# Patient Record
Sex: Female | Born: 1965 | Race: Black or African American | Hispanic: No | Smoking: Never smoker
Health system: Southern US, Community
[De-identification: ages and names within clinical notes are randomized; demographics above are authoritative.]

## PROBLEM LIST (undated history)

## (undated) DIAGNOSIS — R519 Headache, unspecified: Secondary | ICD-10-CM

## (undated) DIAGNOSIS — E559 Vitamin D deficiency, unspecified: Secondary | ICD-10-CM

## (undated) DIAGNOSIS — E785 Hyperlipidemia, unspecified: Secondary | ICD-10-CM

## (undated) DIAGNOSIS — F419 Anxiety disorder, unspecified: Secondary | ICD-10-CM

## (undated) DIAGNOSIS — K5792 Diverticulitis of intestine, part unspecified, without perforation or abscess without bleeding: Secondary | ICD-10-CM

## (undated) DIAGNOSIS — G35 Multiple sclerosis: Secondary | ICD-10-CM

## (undated) DIAGNOSIS — I10 Essential (primary) hypertension: Secondary | ICD-10-CM

## (undated) DIAGNOSIS — R011 Cardiac murmur, unspecified: Secondary | ICD-10-CM

## (undated) DIAGNOSIS — R51 Headache: Secondary | ICD-10-CM

## (undated) DIAGNOSIS — F32A Depression, unspecified: Secondary | ICD-10-CM

## (undated) DIAGNOSIS — G4730 Sleep apnea, unspecified: Secondary | ICD-10-CM

## (undated) DIAGNOSIS — K219 Gastro-esophageal reflux disease without esophagitis: Secondary | ICD-10-CM

## (undated) DIAGNOSIS — Z8719 Personal history of other diseases of the digestive system: Secondary | ICD-10-CM

## (undated) DIAGNOSIS — F329 Major depressive disorder, single episode, unspecified: Secondary | ICD-10-CM

## (undated) HISTORY — DX: Headache, unspecified: R51.9

## (undated) HISTORY — DX: Depression, unspecified: F32.A

## (undated) HISTORY — PX: ABDOMINAL HYSTERECTOMY: SHX81

## (undated) HISTORY — DX: Headache: R51

## (undated) HISTORY — DX: Major depressive disorder, single episode, unspecified: F32.9

## (undated) HISTORY — DX: Vitamin D deficiency, unspecified: E55.9

---

## 1990-05-26 HISTORY — PX: TUBAL LIGATION: SHX77

## 2001-01-20 ENCOUNTER — Encounter: Admission: RE | Admit: 2001-01-20 | Discharge: 2001-01-20 | Payer: Self-pay | Admitting: Neurosurgery

## 2001-01-20 ENCOUNTER — Encounter: Payer: Self-pay | Admitting: Neurosurgery

## 2001-03-02 ENCOUNTER — Ambulatory Visit (HOSPITAL_COMMUNITY): Admission: RE | Admit: 2001-03-02 | Discharge: 2001-03-02 | Payer: Self-pay | Admitting: Neurosurgery

## 2001-03-29 ENCOUNTER — Inpatient Hospital Stay (HOSPITAL_COMMUNITY): Admission: RE | Admit: 2001-03-29 | Discharge: 2001-04-03 | Payer: Self-pay | Admitting: Neurosurgery

## 2001-03-29 ENCOUNTER — Encounter: Payer: Self-pay | Admitting: Neurosurgery

## 2002-05-26 HISTORY — PX: BACK SURGERY: SHX140

## 2002-10-12 ENCOUNTER — Encounter: Payer: Self-pay | Admitting: *Deleted

## 2002-10-12 ENCOUNTER — Ambulatory Visit (HOSPITAL_COMMUNITY): Admission: RE | Admit: 2002-10-12 | Discharge: 2002-10-12 | Payer: Self-pay | Admitting: *Deleted

## 2004-09-02 ENCOUNTER — Emergency Department (HOSPITAL_COMMUNITY): Admission: EM | Admit: 2004-09-02 | Discharge: 2004-09-02 | Payer: Self-pay | Admitting: Emergency Medicine

## 2005-09-03 ENCOUNTER — Emergency Department (HOSPITAL_COMMUNITY): Admission: EM | Admit: 2005-09-03 | Discharge: 2005-09-03 | Payer: Self-pay | Admitting: Emergency Medicine

## 2005-11-05 ENCOUNTER — Encounter (INDEPENDENT_AMBULATORY_CARE_PROVIDER_SITE_OTHER): Payer: Self-pay | Admitting: Specialist

## 2005-11-05 ENCOUNTER — Ambulatory Visit (HOSPITAL_COMMUNITY): Admission: RE | Admit: 2005-11-05 | Discharge: 2005-11-05 | Payer: Self-pay | Admitting: Obstetrics & Gynecology

## 2005-11-17 ENCOUNTER — Emergency Department (HOSPITAL_COMMUNITY): Admission: EM | Admit: 2005-11-17 | Discharge: 2005-11-17 | Payer: Self-pay | Admitting: Emergency Medicine

## 2006-01-25 ENCOUNTER — Emergency Department (HOSPITAL_COMMUNITY): Admission: EM | Admit: 2006-01-25 | Discharge: 2006-01-25 | Payer: Self-pay | Admitting: Emergency Medicine

## 2006-05-26 HISTORY — PX: PARTIAL HYSTERECTOMY: SHX80

## 2006-07-23 ENCOUNTER — Ambulatory Visit: Payer: Self-pay | Admitting: Internal Medicine

## 2006-09-04 ENCOUNTER — Emergency Department (HOSPITAL_COMMUNITY): Admission: EM | Admit: 2006-09-04 | Discharge: 2006-09-05 | Payer: Self-pay | Admitting: Emergency Medicine

## 2007-04-27 ENCOUNTER — Ambulatory Visit (HOSPITAL_COMMUNITY): Admission: RE | Admit: 2007-04-27 | Discharge: 2007-04-27 | Payer: Self-pay | Admitting: Nurse Practitioner

## 2007-04-27 ENCOUNTER — Encounter: Payer: Self-pay | Admitting: Nurse Practitioner

## 2007-05-27 ENCOUNTER — Encounter: Payer: Self-pay | Admitting: Nurse Practitioner

## 2007-05-27 HISTORY — PX: OTHER SURGICAL HISTORY: SHX169

## 2007-06-23 ENCOUNTER — Encounter: Admission: RE | Admit: 2007-06-23 | Discharge: 2007-06-23 | Payer: Self-pay | Admitting: Neurosurgery

## 2007-07-16 ENCOUNTER — Ambulatory Visit (HOSPITAL_COMMUNITY): Admission: RE | Admit: 2007-07-16 | Discharge: 2007-07-16 | Payer: Self-pay | Admitting: Neurosurgery

## 2007-08-31 ENCOUNTER — Encounter: Admission: RE | Admit: 2007-08-31 | Discharge: 2007-08-31 | Payer: Self-pay | Admitting: Neurosurgery

## 2007-12-27 ENCOUNTER — Ambulatory Visit (HOSPITAL_COMMUNITY): Admission: RE | Admit: 2007-12-27 | Discharge: 2007-12-27 | Payer: Self-pay | Admitting: Neurology

## 2008-05-30 ENCOUNTER — Emergency Department (HOSPITAL_COMMUNITY): Admission: EM | Admit: 2008-05-30 | Discharge: 2008-05-30 | Payer: Self-pay | Admitting: Emergency Medicine

## 2008-06-19 ENCOUNTER — Ambulatory Visit: Admission: RE | Admit: 2008-06-19 | Discharge: 2008-06-19 | Payer: Self-pay | Admitting: Occupational Therapy

## 2008-08-06 ENCOUNTER — Emergency Department (HOSPITAL_COMMUNITY): Admission: EM | Admit: 2008-08-06 | Discharge: 2008-08-06 | Payer: Self-pay | Admitting: Emergency Medicine

## 2008-08-07 ENCOUNTER — Ambulatory Visit (HOSPITAL_BASED_OUTPATIENT_CLINIC_OR_DEPARTMENT_OTHER): Admission: RE | Admit: 2008-08-07 | Discharge: 2008-08-07 | Payer: Self-pay | Admitting: Neurology

## 2008-08-22 ENCOUNTER — Ambulatory Visit (HOSPITAL_COMMUNITY): Admission: RE | Admit: 2008-08-22 | Discharge: 2008-08-22 | Payer: Self-pay | Admitting: Nurse Practitioner

## 2008-08-25 ENCOUNTER — Emergency Department (HOSPITAL_COMMUNITY): Admission: EM | Admit: 2008-08-25 | Discharge: 2008-08-26 | Payer: Self-pay | Admitting: Emergency Medicine

## 2009-05-01 ENCOUNTER — Emergency Department: Payer: Self-pay | Admitting: Emergency Medicine

## 2009-07-29 ENCOUNTER — Emergency Department (HOSPITAL_COMMUNITY): Admission: EM | Admit: 2009-07-29 | Discharge: 2009-07-29 | Payer: Self-pay | Admitting: Emergency Medicine

## 2010-01-02 ENCOUNTER — Emergency Department (HOSPITAL_COMMUNITY)
Admission: EM | Admit: 2010-01-02 | Discharge: 2010-01-02 | Payer: Self-pay | Source: Home / Self Care | Admitting: Emergency Medicine

## 2010-01-24 DIAGNOSIS — K5792 Diverticulitis of intestine, part unspecified, without perforation or abscess without bleeding: Secondary | ICD-10-CM

## 2010-01-24 HISTORY — DX: Diverticulitis of intestine, part unspecified, without perforation or abscess without bleeding: K57.92

## 2010-01-29 ENCOUNTER — Inpatient Hospital Stay (HOSPITAL_COMMUNITY): Admission: EM | Admit: 2010-01-29 | Discharge: 2010-02-01 | Payer: Self-pay | Admitting: Emergency Medicine

## 2010-03-27 ENCOUNTER — Ambulatory Visit: Payer: Self-pay

## 2010-04-11 ENCOUNTER — Ambulatory Visit: Payer: Self-pay

## 2010-06-15 ENCOUNTER — Emergency Department (HOSPITAL_COMMUNITY)
Admission: EM | Admit: 2010-06-15 | Discharge: 2010-06-15 | Payer: Self-pay | Source: Home / Self Care | Admitting: Emergency Medicine

## 2010-06-18 LAB — URINALYSIS, ROUTINE W REFLEX MICROSCOPIC
Bilirubin Urine: NEGATIVE
Ketones, ur: NEGATIVE mg/dL
Specific Gravity, Urine: 1.015 (ref 1.005–1.030)
Urobilinogen, UA: 0.2 mg/dL (ref 0.0–1.0)

## 2010-06-18 LAB — CBC
HCT: 40.4 % (ref 36.0–46.0)
MCH: 27.4 pg (ref 26.0–34.0)
MCHC: 34.9 g/dL (ref 30.0–36.0)
RBC: 5.14 MIL/uL — ABNORMAL HIGH (ref 3.87–5.11)

## 2010-06-18 LAB — BASIC METABOLIC PANEL
CO2: 26 mEq/L (ref 19–32)
Calcium: 9.2 mg/dL (ref 8.4–10.5)
Chloride: 103 mEq/L (ref 96–112)
Creatinine, Ser: 0.88 mg/dL (ref 0.4–1.2)
GFR calc Af Amer: >60 mL/min (ref >60)
GFR calc non Af Amer: >60 mL/min (ref >60)
Potassium: 3.6 mEq/L (ref 3.5–5.1)
Sodium: 138 mEq/L (ref 135–145)

## 2010-06-18 LAB — DIFFERENTIAL
Eosinophils Relative: 3 % (ref 0–5)
Lymphs Abs: 1.5 10*3/uL (ref 0.7–4.0)
Monocytes Absolute: 0.5 10*3/uL (ref 0.1–1.0)
Monocytes Relative: 14 % — ABNORMAL HIGH (ref 3–12)
Neutro Abs: 1.3 10*3/uL — ABNORMAL LOW (ref 1.7–7.7)

## 2010-06-26 ENCOUNTER — Emergency Department (HOSPITAL_COMMUNITY): Admit: 2010-06-26 | Discharge: 2010-06-26 | Disposition: A | Discharge status: Home / Self Care | Payer: Medicare Other

## 2010-06-26 ENCOUNTER — Emergency Department (HOSPITAL_COMMUNITY)
Admission: EM | Admit: 2010-06-26 | Discharge: 2010-06-26 | Disposition: A | Discharge status: Home / Self Care | Payer: Medicare Other | Attending: Emergency Medicine | Admitting: Emergency Medicine

## 2010-06-26 ENCOUNTER — Encounter (HOSPITAL_COMMUNITY): Payer: Self-pay

## 2010-06-26 DIAGNOSIS — R51 Headache: Secondary | ICD-10-CM | POA: Insufficient documentation

## 2010-06-26 DIAGNOSIS — R5383 Other fatigue: Secondary | ICD-10-CM | POA: Insufficient documentation

## 2010-06-26 DIAGNOSIS — J3489 Other specified disorders of nose and nasal sinuses: Secondary | ICD-10-CM | POA: Insufficient documentation

## 2010-06-26 DIAGNOSIS — R059 Cough, unspecified: Secondary | ICD-10-CM | POA: Insufficient documentation

## 2010-06-26 DIAGNOSIS — R05 Cough: Secondary | ICD-10-CM | POA: Insufficient documentation

## 2010-06-26 DIAGNOSIS — R5381 Other malaise: Secondary | ICD-10-CM | POA: Insufficient documentation

## 2010-06-26 DIAGNOSIS — R509 Fever, unspecified: Secondary | ICD-10-CM | POA: Insufficient documentation

## 2010-06-26 DIAGNOSIS — R42 Dizziness and giddiness: Secondary | ICD-10-CM | POA: Insufficient documentation

## 2010-08-08 LAB — CBC
HCT: 36.4 % (ref 36.0–46.0)
HCT: 38.2 % (ref 36.0–46.0)
Hemoglobin: 12.2 g/dL (ref 12.0–15.0)
Hemoglobin: 12.7 g/dL (ref 12.0–15.0)
MCHC: 33.3 g/dL (ref 30.0–36.0)
MCV: 81.6 fL (ref 78.0–100.0)
MCV: 82.6 fL (ref 78.0–100.0)
Platelets: 221 10*3/uL (ref 150–400)
Platelets: 239 10*3/uL (ref 150–400)
RBC: 4.48 MIL/uL (ref 3.87–5.11)
RDW: 13.9 % (ref 11.5–15.5)
RDW: 14.2 % (ref 11.5–15.5)
WBC: 3.1 10*3/uL — ABNORMAL LOW (ref 4.0–10.5)
WBC: 3.6 10*3/uL — ABNORMAL LOW (ref 4.0–10.5)
WBC: 4.4 10*3/uL (ref 4.0–10.5)

## 2010-08-08 LAB — HEPATIC FUNCTION PANEL
Albumin: 3.5 g/dL (ref 3.5–5.2)
Alkaline Phosphatase: 63 U/L (ref 39–117)
Total Protein: 6.5 g/dL (ref 6.0–8.3)

## 2010-08-08 LAB — DIFFERENTIAL
Basophils Absolute: 0 10*3/uL (ref 0.0–0.1)
Basophils Absolute: 0 10*3/uL (ref 0.0–0.1)
Basophils Relative: 1 % (ref 0–1)
Eosinophils Absolute: 0.1 10*3/uL (ref 0.0–0.7)
Eosinophils Relative: 3 % (ref 0–5)
Lymphocytes Relative: 30 % (ref 12–46)
Neutro Abs: 1.9 10*3/uL (ref 1.7–7.7)
Neutro Abs: 2.2 10*3/uL (ref 1.7–7.7)
Neutrophils Relative %: 51 % (ref 43–77)
Neutrophils Relative %: 52 % (ref 43–77)

## 2010-08-08 LAB — URINALYSIS, ROUTINE W REFLEX MICROSCOPIC
Bilirubin Urine: NEGATIVE
Ketones, ur: NEGATIVE mg/dL
Nitrite: NEGATIVE
Protein, ur: NEGATIVE mg/dL
Specific Gravity, Urine: 1.01 (ref 1.005–1.030)
Urobilinogen, UA: 1 mg/dL (ref 0.0–1.0)

## 2010-08-08 LAB — BASIC METABOLIC PANEL
BUN: 10 mg/dL (ref 6–23)
CO2: 25 mEq/L (ref 19–32)
Calcium: 8.7 mg/dL (ref 8.4–10.5)
Chloride: 111 mEq/L (ref 96–112)
GFR calc Af Amer: >60 mL/min (ref >60)
GFR calc non Af Amer: >60 mL/min (ref >60)
GFR calc non Af Amer: >60 mL/min (ref >60)
Glucose, Bld: 100 mg/dL — ABNORMAL HIGH (ref 70–99)
Glucose, Bld: 93 mg/dL (ref 70–99)
Potassium: 3.5 mEq/L (ref 3.5–5.1)
Potassium: 4 mEq/L (ref 3.5–5.1)
Sodium: 139 mEq/L (ref 135–145)
Sodium: 140 mEq/L (ref 135–145)

## 2010-08-08 LAB — MAGNESIUM: Magnesium: 2.2 mg/dL (ref 1.5–2.5)

## 2010-08-08 LAB — TSH: TSH: 2.64 u[IU]/mL (ref 0.350–4.500)

## 2010-08-08 LAB — PREGNANCY, URINE: Preg Test, Ur: NEGATIVE

## 2010-08-09 LAB — DIFFERENTIAL
Eosinophils Relative: 1 % (ref 0–5)
Lymphocytes Relative: 22 % (ref 12–46)
Lymphs Abs: 1.1 10*3/uL (ref 0.7–4.0)
Monocytes Absolute: 0.6 10*3/uL (ref 0.1–1.0)

## 2010-08-09 LAB — CBC
HCT: 40.6 % (ref 36.0–46.0)
MCV: 82.6 fL (ref 78.0–100.0)
RBC: 4.91 MIL/uL (ref 3.87–5.11)
WBC: 4.8 10*3/uL (ref 4.0–10.5)

## 2010-08-09 LAB — ETHANOL: Alcohol, Ethyl (B): <5 mg/dL (ref 0–10)

## 2010-08-09 LAB — RAPID URINE DRUG SCREEN, HOSP PERFORMED
Barbiturates: NOT DETECTED
Cocaine: NOT DETECTED

## 2010-08-09 LAB — BASIC METABOLIC PANEL
BUN: 6 mg/dL (ref 6–23)
Chloride: 106 mEq/L (ref 96–112)
Potassium: 3.6 mEq/L (ref 3.5–5.1)

## 2010-08-18 LAB — URINALYSIS, ROUTINE W REFLEX MICROSCOPIC
Bilirubin Urine: NEGATIVE
Glucose, UA: NEGATIVE mg/dL
Hgb urine dipstick: NEGATIVE
Ketones, ur: NEGATIVE mg/dL
Nitrite: NEGATIVE
Protein, ur: NEGATIVE mg/dL
Specific Gravity, Urine: 1.02 (ref 1.005–1.030)
Urobilinogen, UA: 1 mg/dL (ref 0.0–1.0)
pH: 6 (ref 5.0–8.0)

## 2010-09-09 LAB — CBC
HCT: 41.7 % (ref 36.0–46.0)
MCV: 80.9 fL (ref 78.0–100.0)
Platelets: 196 10*3/uL (ref 150–400)
RDW: 13.7 % (ref 11.5–15.5)
WBC: 3.7 10*3/uL — ABNORMAL LOW (ref 4.0–10.5)

## 2010-09-09 LAB — BASIC METABOLIC PANEL
BUN: 7 mg/dL (ref 6–23)
Chloride: 104 mEq/L (ref 96–112)
Glucose, Bld: 81 mg/dL (ref 70–99)
Potassium: 3.7 mEq/L (ref 3.5–5.1)

## 2010-09-09 LAB — DIFFERENTIAL
Eosinophils Absolute: 0.1 10*3/uL (ref 0.0–0.7)
Eosinophils Relative: 2 % (ref 0–5)
Lymphs Abs: 1.4 10*3/uL (ref 0.7–4.0)
Monocytes Absolute: 0.6 10*3/uL (ref 0.1–1.0)

## 2010-09-09 LAB — PROTIME-INR: Prothrombin Time: 12.8 seconds (ref 11.6–15.2)

## 2010-09-09 LAB — POCT CARDIAC MARKERS
Myoglobin, poc: 45.4 ng/mL (ref 12–200)
Troponin i, poc: <0.05 ng/mL (ref 0.00–0.09)

## 2010-10-08 NOTE — Procedures (Signed)
[name], Jill English            ACCOUNT NO.:  [id number]   MEDICAL RECORD NO.:  [id number]          PATIENT TYPE:  OUT   LOCATION:  SLEE                         FACILITY:  [facility]   PHYSICIAN:  Kofi A. Gerilyn Pilgrim, M.D. DATE OF BIRTH:  [birth year]   DATE OF PROCEDURE:  08/07/2008  DATE OF DISCHARGE:  08/07/2008                             SLEEP DISORDER REPORT   TYPE OF STUDY:  Multiple sleep latency tests.   The patient is a 45 year old lady who presents with marked hypersomnia.  She previously did have a nocturnal polysomnography, which showed  pathologically early REM latency and a high REM percentage.   EPWORTH SLEEPINESS SCALE:  21.   ARCHITECTURAL SUMMARY:  A nocturnal polysomnography was not done the  night before the PSG was done 3 weeks before.  Sleep logs were given  before the study was carried out.   MEDICATIONS:  Rebif, Ativan, Zocor, and Lotensin.   The patient had a series of daytime naps, total of 5.  There were some  adjustment made on the sleep tech's sleep latency by the physician.  Nap  1 had a sleep latency of 2.5 with no sleep onsets of REM.  Nap 2, sleep  onset of 5.5 minutes with sleep onsets of REM observed.  Nap 3, sleep  latency of 1 minute with sleep onsets of REM observed.  Nap 4, sleep  latency of 1 minute with sleep onsets of REM observed.  Nap 5, sleep  latency at 2.5 with sleep onsets of REM.  The mean sleep latency is 2.5  minutes.   IMPRESSION:  This study shows a mean sleep latency of 2.5 minutes with  4/5 naps associated with sleep onset REM, meeting the polysomnographic  criteria for narcolepsy.      Kofi A. Gerilyn Pilgrim, M.D.  Electronically Signed     KAD/MEDQ  D:  09/05/2008  T:  09/06/2008  Job:  [id number]

## 2010-10-08 NOTE — Op Note (Signed)
Jill English, Jill English            ACCOUNT NO.:  [id number]   MEDICAL RECORD NO.:  [id number]          PATIENT TYPE:  AMB   LOCATION:  SDS                          FACILITY:  [facility]   PHYSICIAN:  Henry A. Pool, M.D.    DATE OF BIRTH:  [birth year]   DATE OF PROCEDURE:  07/16/2007  DATE OF DISCHARGE:  07/16/2007                               OPERATIVE REPORT   SERVICE:  Neurosurgery.   PREOPERATIVE DIAGNOSIS:  Right carpal tunnel syndrome.   POSTOPERATIVE DIAGNOSIS:  Right carpal tunnel syndrome.   PROCEDURE NOTE:  Right carpal tunnel release.   SURGEON:  Kathaleen Maser. Pool, M.D.   ASSISTANT:  None.   ANESTHESIA:  Is general.   INDICATIONS:  Ms. Lafever is a 46 year old female with history of pain  and paresthesias and weakness with this with right-sided carpal tunnel  syndrome failing conservative measures.  Workup demonstrates evidence of  marked median nerve entrapment at the wrist evidence by nerve conduction  slowing and EMG changes.  The patient has been counseled as to her  options.  She decided to proceed with a right-sided carpal tunnel  release.   PROCEDURE IN DETAIL:  The patient was taken to the operating room and  placed on the operating table in supine position with her right arm  outstretched.  After an adequate level of anesthesia achieved the  patient's right upper extremity was prepped and draped sterilely.  A 15  blade was used make a linear skin incision along the mid palmar crease  just distal to the distal wrist fold.  This was carried down sharply  through the palmar fascia.  Transverse carpal ligament was identified.  Transverse carpal ligament was then divided exposing the underlying  median nerve.  The median nerve was then protected with a Therapist, nutritional  and the transverse carpal ligament was divided distally into the palm  completely freeing the distal segment of the median nerve.  The Freer  elevator was then redirected proximally.  The proximal aspect  of the  transverse carpal ligament was then divided up into the distal forearm,  completely decompressing this aspect of the median nerve.  At this point  the wound was irrigated with antibiotic solution.  The nerve was  inspected and found to be free of injury.  The wound was then closed in  a typical fashion.  Dry sterile dressing was applied.  There were no  complications.  The patient tolerated the procedure well and she returns  to recovery for postoperative care.           ______________________________  Kathaleen Maser. Pool, M.D.     HAP/MEDQ  D:  07/16/2007  T:  07/16/2007  Job:  [id number]

## 2010-10-11 NOTE — Op Note (Signed)
NAMESHI, GROSE            ACCOUNT NO.:  [id number]   MEDICAL RECORD NO.:  [id number]          PATIENT TYPE:  AMB   LOCATION:  DAY                           FACILITY:  [facility]   PHYSICIAN:  Lazaro Arms, M.D.   DATE OF BIRTH:  Feb 29, 1966   DATE OF PROCEDURE:  11/05/2005  DATE OF DISCHARGE:                                 OPERATIVE REPORT   PREOPERATIVE DIAGNOSIS:  1.  Right lower quadrant pain.  2.  Dyspareunia on the right side.   POSTOPERATIVE DIAGNOSIS:  1.  Right lower quadrant pain.  2.  Dyspareunia on the right side.  3.  Pelvic adhesive disease.   PROCEDURE:  1.  Laparoscopic right salpingo-oophorectomy.  2.  Laparoscopic lysis of adhesions, extensive.   SURGEON:  Lazaro Arms, M.D.   ANESTHESIA:  General endotracheal.   FINDINGS:  The patient had large bowel and omentum adherent to the pelvis,  to the vaginal cuff, and to the pelvic side wall on the right.  I think this  pretty clearly along with the adhesions of the right ovary, was the source  of the patient's pain.  Right ovary otherwise appeared to be normal, it was  just adherent to the side wall.  The left ovary was normal.  It was also  adherent to the sigmoid epiploica, but that was taken down without  difficulty and it was freely mobile and completely normal.  It was not  adherent to the vaginal cuff.   DESCRIPTION OF PROCEDURE:  The patient was taken to the operating room,  placed in the supine position, and underwent general endotracheal  anesthesia.  She was placed in the dorsal lithotomy position and prepped and  draped in the usual sterile fashion.  Incision was made in the umbilicus.  Veress needle was placed, confirmed using the saline drop test.  The abdomen  was insufflated.  Under direct visualization, nonbladed trocar was used and  the video laparoscope was used to place trocar in the peritoneal cavity.  I  then placed two 5 mm trocars in the suprapubic area in the midline as well  as  the right lower quadrant without difficulty.  The above noted findings  were seen.  I used the Harmonic scalpel to do a right salpingo-oophorectomy  without difficulty.  There was good hemostasis and also used blunt and  Harmonic scalpel to take down the adhesions of the omental fat and epiploica  and other adhesions.  These were done without difficulty.  With clear  visualization, there was no injury to any organ structures.  The pelvis was  irrigated vigorously and actually left some fluid in the pelvis to try to  prevent some postoperative adhesions from  forming.  The instruments were removed.  The fascia was closed using 0  Vicryl.  All of the skin incisions were closed using staples.  The patient  tolerated the procedure well.  She experienced minimal blood loss and was  taken to the recovery room in good stable condition.  All needle, sponge,  and instrument counts correct.      Lazaro Arms, M.D.  Electronically Signed     LHE/MEDQ  D:  11/05/2005  T:  11/05/2005  Job:  [id number]

## 2010-10-11 NOTE — Procedures (Signed)
Jill English, Jill English            ACCOUNT NO.:  [id number]   MEDICAL RECORD NO.:  [id number]          PATIENT TYPE:  OUT   LOCATION:  SLEE                          FACILITY:  [facility]   PHYSICIAN:  Kofi A. Gerilyn Pilgrim, M.D. DATE OF BIRTH:  02/19/1966   DATE OF PROCEDURE:  DATE OF DISCHARGE:  06/19/2008                             SLEEP DISORDER REPORT   REFERRING PHYSICIAN:  Jay Schlichter.   INDICATIONS:  This is a 45 year old lady who presents with hypersomnia  and is being evaluated for obstructive sleep apnea syndrome.   MEDICATIONS:  Nexium, Zocor, Rebif, vitamin D.   Epworth sleepiness scale 14.  BMI 32.   ARCHITECTURE SUMMARY:  Total recording time of 406 minutes.  Sleep  efficiency 87%.  Sleep latency 60 minutes.  REM latency 46 minutes.  Stage N1 1%, N2 61%, N3 20%, and REM sleep 17%.   RESPIRATORY SUMMARY:  The baseline oxygen saturation is 99% with the  lowest saturation 92%.  The AHI is 1.   LIMB MOVEMENT SUMMARY:  The PLM index is 36.   ELECTROCARDIOGRAM SUMMARY:  Average heart rate 71 with no significant  dysrhythmias observed.   IMPRESSION:  1. Significantly early rapid eye movement sleep, suggestive of      narcolepsy, although other etiologies include withdrawal from      stimulant medications.  2. 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   RECOMMENDATIONS:  Given the early REM onset.  I suggest that a sleep  consultation be done.      Kofi A. Gerilyn Pilgrim, M.D.  Electronically Signed     KAD/MEDQ  D:  06/23/2008  T:  06/23/2008  Job:  [id number]

## 2010-10-11 NOTE — Discharge Summary (Signed)
[place]  Patient:    Jill English, Jill English Visit Number: [id number] MRN: [id number]          Service Type: SUR Location: 3000 3023 01 Attending Physician:  Donn Pierini Dictated by:   Julio Sicks, M.D. Admit Date:  03/29/2001 Discharge Date: 04/03/2001                             Discharge Summary  SERVICE:  Neurosurgery.  FINAL DIAGNOSES:  L4-5 and L5-S1 degenerative disk disease with chronic back and lower extremity pain.  OPERATIONS AND TREATMENTS: 1. L4-5 and L5-S1 decompressive lumbar laminectomy with foraminotomies. 2. L4-5 and L5-S1 posterior lumbar interbody fusion utilizing tangent    wedges and local autograft. 3. L4-5 and L5-S1 posterolateral fusion utilizing pedicle screws and local    autograft.  HISTORY OF PRESENT ILLNESS:  The patient is a 45 year old female with a history of chronic back and bilateral lower extremity pain failing all conservative management. Discography is positive both L4 and L5. The patient presents now for decompression and fusion surgery in hopes of alleviating some of her symptoms.  HOSPITAL COURSE:  The patient is taken to the operating room where an uncomplicated two level lumbar decompression and fusion surgery was performed. Postoperatively, the patient awakened with intact neurological function. She had a great deal of difficulty with incisional pain which gradually cleared with time. She had some difficulty with a postoperative ileus which also cleared over time.  At the time of discharge, the patient was ambulating with minimal assistance of a walker. Neurologically, she was intact; the wound was healing well. There was no evidence of infection. Bowel and bladder function was normal.  DISPOSITION:  The patient will be discharged home. She will follow-up in my office in one week.  DISCHARGE MEDICATIONS: 1. Oxycontin p.r.n. pain. 2. Percocet p.r.n. pain. 3. Valium p.r.n.  pain.  CONDITION ON DISCHARGE:  Improved. Dictated by:   Julio Sicks, M.D. Attending Physician:  Donn Pierini DD:  03/16/02 TD:  06/17/01 Job: [id number]

## 2010-10-11 NOTE — Op Note (Signed)
. [facility]  Patient:    Jill English, Jill English Visit Number: [id number] MRN: [id number]          Service Type: SUR Location: 3000 3023 01 Attending Physician:  Donn Pierini Dictated by:   Julio Sicks, M.D. Proc. Date: 03/29/01 Admit Date:  03/29/2001                             Operative Report  PREOPERATIVE DIAGNOSIS:  L4-5 and L5-S1 degenerative disk disease with chronic intractable back pain.  POSTOPERATIVE DIAGNOSIS:  L4-5 and L5-S1 degenerative disk disease with chronic intractable back pain.  PROCEDURES: 1. L4-5 and L5-S1 decompressive laminectomies with foraminotomies. 2. L4-5 and L5-S1 posterior lumbar interbody fusion utilizing Tangent wedges    and local autograft. 3. L4-5 and S1 posterolateral fusion utilizing segmental pedicle screw    fixation and local autograft.  SURGEON:  Julio Sicks, M.D.  ASSISTANT:  Reinaldo Meeker, M.D.  ANESTHESIA:  General endotracheal.  INDICATION:  Ms. [name] is a 45 year old black female with a history of severe back and right lower extremity pain failing all efforts at conservative management.  MRI scanning demonstrates degenerative disk disease at the L5-S1 level.  Lumbar discography demonstrates a large central annular disruption at L4-5 with extrusion of dye.  L5-S1 injection demonstrates severe degeneration of the disk with multiple annular fissures.  Injection at both these levels was provocative.  Injection at L3-4 was negative.  The patient has been counseled as to her options.  She has decided to proceed with L4-5 and L5-S1 decompression and fusion procedure in hopes of improving her situation.  DESCRIPTION OF PROCEDURE:  The patient was taken to the operating room and placed on the table in supine position.  After an adequate level of anesthesia achieved, patient positioned prone onto a Wilson frame, appropriately padded. The patients lumbar region was prepped and draped sterilely.   A 10 blade was used to make a linear skin incision overlying the L3, L4, L5, and S1 levels. The incision was carried down in the midline.  A subperiosteal dissection was then performed bilaterally, exposing the laminae and facet joints at L3, L4, L5, and the sacrum.  The transverse processes at L4 and L5 as well as the sacral ala were then also dissected out.  A deep self-retaining retractor was placed.  Intraoperative fluoroscopy was used, and the levels were confirmed. Decompressive laminectomy was then performed using the Leksell rongeur, the high-speed drill, and Kerrison rongeurs to completely remove the lamina at L5 and completely remove the lamina of L4.  The inferior facets at L4 and L5 were also completely removed.  All bone was cleaned and later used for autografting.  Superior facets at L5 and the superior facet at S1 were removed as well.  Ligamentum flavum was then elevated and removed in piecemeal fashion at both levels.  Underlying thecal sac and exiting L4, L5, and S1 nerve roots were identified.  Wide foraminotomies were then performed along the exiting nerve roots at all three levels.  Attention then placed to the interspace after epidural venous plexus was coagulated and cut.  Starting first on the patients left side at L4-5, disk space was isolated, nerve roots were protected, disk space incised with a 15 blade.  An aggressive diskectomy was then performed at L4-5, removing all loose or obviously degenerative disk material.  The procedure then repeated on the contralateral side, again without complication.  The disk space  was then distracted up to 11 mm. Starting first the patients left side with the nerve roots protected, the disk space was then reamed and then cut with a 10 mm chisel.  A 10 x 26 mm Tangent wedge was then impacted in place and recessed approximately 2 mm from the posterior cortical surface.  The distraction system was removed.  There was no evidence of  injury to the thecal sac or nerve roots.  The procedure was then removed on the contralateral side, again without complication.  Prior to installation of the second wedge, morcellized autograft was then packed into the interspace.  The second wedge was placed, again without difficulty. Intraoperative x-rays revealed good position of bone graft, proper operative levels, normal alignment of the spine.  The procedure was then repeated in a similar fashion at L5-S1.  At L5-S1, 8 x 26 mm Tangent wedges were placed bilaterally.  Once again final images revealed good position of the bone grafts with normal alignment of the spine.  Transverse processes of L4, L5, and the sacral ala were identified.  The pedicles were isolated and identified by surface landmarks and by using fluoroscopic guidance.  Superficial bone overlying the pedicle was then removed using the high-speed drill.  The pedicle was then probed using a pedicle awl at L4, L5, and S1 bilaterally. Each pedicle awl tract was intact with 5.25 mm screw tap.  All screw tap holes were found to be solidly within bone by using a blunt probe and confirmed by x-ray guidance.  STRS variable-angle pedicle screws 6.75 x 40 mm were then placed at L4 bilaterally.  STRS variable-angle pedicle screws 6.75 x 35 mm were placed at L5 bilaterally.  STRS variable-angle pedicle screws 6.75 x 30 mm were then placed at S1 bilaterally.  All screws were given a final tightening and found to be solidly within bone.  An 80 mm length of titanium rod was then contoured and placed over the screw heads at L4, L5, and S1 bilaterally.  Locking caps were then placed over the screw heads. The caps were then engaged in a sequential fashion to place the construct under compression.  At this point intraoperative fluoroscopy revealed good position of the bone grafts and hardware and proper operative level, with normal alignment of the spine in both the lateral and AP planes.  A  blunt probe was passed easily along the course of the exiting nerve roots.  There was no evidence of injury to the thecal sac or nerve roots.  All instrumentation  appeared well-placed.  The transverse processes and sacral ala were then decorticated using the high-speed drill.  Morcellized autograft was then packed posterolaterally for later fusion.  The spinal canal was inspected one final time and found to be free of compression.  Gelfoam was placed topically for hemostasis, which was found to be good.  A medium Hemovac drain was left in the epidural space.  The wound was then closed in typical fashion. Steri-Strips and sterile dressing were applied.  There were no apparent complications.  The patient tolerated the procedure well, and she returns to the recovery room postop. Dictated by:   Julio Sicks, M.D. Attending Physician:  Donn Pierini DD:  03/29/01 TD:  03/30/01 Job: [id number]

## 2011-02-14 LAB — CBC
MCV: 81.8
RBC: 5.23 — ABNORMAL HIGH
WBC: 4.9

## 2011-02-21 LAB — CSF CELL COUNT WITH DIFFERENTIAL: Tube #: 3

## 2011-02-21 LAB — OLIGOCLONAL BANDS, CSF + SERM
Albumin Index: 3.8 ratio (ref 0.0–9.0)
Albumin, CSF: 15 mg/dL (ref 0–35)
CSF Oligoclonal Bands: POSITIVE
IgG, Serum: 1230 mg/dL (ref 768–1632)

## 2011-02-21 LAB — CRYPTOCOCCAL ANTIGEN, CSF: Crypto Ag: NEGATIVE

## 2011-02-21 LAB — ANGIOTENSIN CONVERTING ENZYME, CSF: Angio Convert Enzyme: <2 Units (ref <10)

## 2011-03-04 ENCOUNTER — Emergency Department (HOSPITAL_COMMUNITY): Payer: Medicare Other

## 2011-03-04 ENCOUNTER — Emergency Department (HOSPITAL_COMMUNITY)
Admission: EM | Admit: 2011-03-04 | Discharge: 2011-03-04 | Disposition: A | Discharge status: Home / Self Care | Payer: Medicare Other | Attending: Emergency Medicine | Admitting: Emergency Medicine

## 2011-03-04 ENCOUNTER — Encounter (HOSPITAL_COMMUNITY): Payer: Self-pay | Admitting: *Deleted

## 2011-03-04 DIAGNOSIS — M549 Dorsalgia, unspecified: Secondary | ICD-10-CM | POA: Insufficient documentation

## 2011-03-04 DIAGNOSIS — R079 Chest pain, unspecified: Secondary | ICD-10-CM | POA: Insufficient documentation

## 2011-03-04 DIAGNOSIS — R109 Unspecified abdominal pain: Secondary | ICD-10-CM | POA: Insufficient documentation

## 2011-03-04 HISTORY — DX: Multiple sclerosis: G35

## 2011-03-04 HISTORY — DX: Hyperlipidemia, unspecified: E78.5

## 2011-03-04 HISTORY — DX: Essential (primary) hypertension: I10

## 2011-03-04 HISTORY — DX: Diverticulitis of intestine, part unspecified, without perforation or abscess without bleeding: K57.92

## 2011-03-04 LAB — HEPATIC FUNCTION PANEL
ALT: 18 U/L (ref 0–35)
Alkaline Phosphatase: 96 U/L (ref 39–117)
Bilirubin, Direct: 0.1 mg/dL (ref 0.0–0.3)
Indirect Bilirubin: 0.1 mg/dL — ABNORMAL LOW (ref 0.3–0.9)
Total Bilirubin: 0.2 mg/dL — ABNORMAL LOW (ref 0.3–1.2)

## 2011-03-04 LAB — CBC
HCT: 41.4 % (ref 36.0–46.0)
Hemoglobin: 13.8 g/dL (ref 12.0–15.0)
MCHC: 33.3 g/dL (ref 30.0–36.0)

## 2011-03-04 LAB — URINALYSIS, ROUTINE W REFLEX MICROSCOPIC
Bilirubin Urine: NEGATIVE
Hgb urine dipstick: NEGATIVE
Protein, ur: NEGATIVE mg/dL
Urobilinogen, UA: 0.2 mg/dL (ref 0.0–1.0)

## 2011-03-04 LAB — DIFFERENTIAL
Basophils Relative: 0 % (ref 0–1)
Lymphocytes Relative: 39 % (ref 12–46)
Monocytes Absolute: 0.5 10*3/uL (ref 0.1–1.0)
Monocytes Relative: 12 % (ref 3–12)
Neutro Abs: 1.9 10*3/uL (ref 1.7–7.7)

## 2011-03-04 LAB — BASIC METABOLIC PANEL
BUN: 8 mg/dL (ref 6–23)
CO2: 23 mEq/L (ref 19–32)
Chloride: 103 mEq/L (ref 96–112)
Creatinine, Ser: 0.61 mg/dL (ref 0.50–1.10)
Glucose, Bld: 102 mg/dL — ABNORMAL HIGH (ref 70–99)

## 2011-03-04 MED ORDER — HYDROCODONE-ACETAMINOPHEN 5-500 MG PO TABS: 1.0000 | ORAL_TABLET | Freq: Four times a day (QID) | ORAL | Status: DC | PRN

## 2011-03-04 NOTE — ED Notes (Signed)
Pt c/o cp, abd pain, and back pain. Pt states cp has been off and on for about 3 weeks. Pt states back and abd pain have been constant for 2 weeks. Pt describes cp as sharp, abd pain as aching and sharp, and back pain as aching and sharp.

## 2011-03-04 NOTE — ED Provider Notes (Signed)
History     CSN: [id number] Arrival date & time: 03/04/2011 12:42 PM  Chief Complaint  Patient presents with  . Chest Pain  . Abdominal Pain  . Back Pain    (Consider location/radiation/quality/duration/timing/severity/associated sxs/prior treatment) Patient is a 45 y.o. female presenting with chest pain, abdominal pain, and back pain. The history is provided by the patient.  Chest Pain The chest pain began 3 - 5 days ago. Chest pain occurs constantly. The chest pain is worsening. The severity of the pain is moderate. The quality of the pain is described as aching. Chest pain is worsened by certain positions. Primary symptoms include abdominal pain. Pertinent negatives for primary symptoms include no fever, no fatigue, no syncope, no shortness of breath, no nausea and no vomiting. She tried nothing for the symptoms. Risk factors include no known risk factors. Past medical history comments: Back surgery    Abdominal Pain The primary symptoms of the illness include abdominal pain. The primary symptoms of the illness do not include fever, fatigue, shortness of breath, nausea or vomiting.  Additional symptoms associated with the illness include back pain. Symptoms associated with the illness do not include chills.  Back Pain  Associated symptoms include chest pain and abdominal pain. Pertinent negatives include no fever.    Past Medical History  Diagnosis Date  . MS (multiple sclerosis)   . Diverticulitis   . Hypertension   . Hyperlipidemia     Past Surgical History  Procedure Date  . Back surgery   . Abdominal hysterectomy   . Tubal ligation     No family history on file.  History  Substance Use Topics  . Smoking status: Never Smoker   . Smokeless tobacco: Not on file  . Alcohol Use: No    OB History    Grav Para Term Preterm Abortions TAB SAB Ect Mult Living                  Review of Systems  Constitutional: Negative for fever, chills and fatigue.  HENT:  Negative for neck pain and neck stiffness.   Respiratory: Negative for shortness of breath.   Cardiovascular: Positive for chest pain. Negative for syncope.  Gastrointestinal: Positive for abdominal pain. Negative for nausea and vomiting.  Musculoskeletal: Positive for back pain.  All other systems reviewed and are negative.    Allergies  Review of patient's allergies indicates no known allergies.  Home Medications  No current outpatient prescriptions on file.  BP 145/80  Pulse 88  Temp(Src) 98.8 F (37.1 C) (Oral)  Resp 18  Ht 5\' 3"  (1.6 m)  Wt 191 lb (86.637 kg)  BMI 33.83 kg/m2  SpO2 100%  Physical Exam  Nursing note and vitals reviewed. Constitutional: She is oriented to person, place, and time. She appears well-developed and well-nourished.  HENT:  Head: Normocephalic and atraumatic.  Neck: Normal range of motion. Neck supple.  Cardiovascular: Normal rate and regular rhythm.  Exam reveals no gallop and no friction rub.   No murmur heard. Pulmonary/Chest: Effort normal and breath sounds normal. No respiratory distress.  Abdominal: Soft. She exhibits no distension. There is no tenderness.  Musculoskeletal: She exhibits no edema.       There is mild ttp of the soft tissues in the mid thoracic and lower lumbar spine.  Neurological: She is alert and oriented to person, place, and time. No cranial nerve deficit. Coordination normal.  Skin: Skin is warm and dry.    ED Course  Procedures (including  critical care time)  Labs Reviewed - No data to display No results found.   No diagnosis found.   Date: 03/04/2011  Rate: 72  Rhythm: normal sinus rhythm  QRS Axis: normal  Intervals: normal  ST/T Wave abnormalities: normal  Conduction Disutrbances:none  Narrative Interpretation:   Old EKG Reviewed: unchanged    MDM  All labs, imaging appear okay.  No source of symptoms found.  Will treat with pain meds and needs follow up if not improving, return if worse.  Also  wants referral to GI for colonoscopy.        Geoffery Lyons, MD 03/04/11 [phone]

## 2011-03-11 ENCOUNTER — Ambulatory Visit (INDEPENDENT_AMBULATORY_CARE_PROVIDER_SITE_OTHER): Payer: Medicare Other | Admitting: Gastroenterology

## 2011-03-11 ENCOUNTER — Encounter: Payer: Self-pay | Admitting: Gastroenterology

## 2011-03-11 VITALS — BP 121/75 | HR 75 | Temp 98.0°F | Ht 63.0 in | Wt 192.0 lb

## 2011-03-11 DIAGNOSIS — R1013 Epigastric pain: Secondary | ICD-10-CM

## 2011-03-11 DIAGNOSIS — K5732 Diverticulitis of large intestine without perforation or abscess without bleeding: Secondary | ICD-10-CM

## 2011-03-11 DIAGNOSIS — K5792 Diverticulitis of intestine, part unspecified, without perforation or abscess without bleeding: Secondary | ICD-10-CM

## 2011-03-11 DIAGNOSIS — K5900 Constipation, unspecified: Secondary | ICD-10-CM

## 2011-03-11 MED ORDER — PANTOPRAZOLE SODIUM 40 MG PO TBEC: 40.0000 mg | DELAYED_RELEASE_TABLET | Freq: Every day | ORAL | Status: DC

## 2011-03-11 MED ORDER — POLYETHYLENE GLYCOL 3350 17 GM/SCOOP PO POWD: 17.0000 g | Freq: Every day | ORAL | Status: AC

## 2011-03-11 NOTE — Progress Notes (Signed)
Primary Care Physician:  Isac Sarna, MD Primary Gastroenterologist:  Dr. Darrick Penna  Chief Complaint  Patient presents with  . Diverticulitis    HPI:   Ms. Jill English is a self-referral today, desiring a colonoscopy. She has a hx of CT documented diverticulitis in Sept 2011. She reports belching, RUQand epigastric pain intermittent, sometimes worse with eating or drinking, depending on how much food she eats. Symptoms present for about 1 year. She reports reflux, food regurgitation with belching. Feels like food doesn't digest, when spits back up looks like didn't digest. No actual vomiting, +nausea. Takes Prilosec prn.   Intermittent lower abdominal soreness, relieved with BM. Constipation predominant symptoms. Feels like has a fever, stomach seems to hurt worse during these episodes. Bloating, tight abdomen.   Has seen some brbpr, on tissue intermittently. No melena. Will go up to 4 days without BM, sometimes so long she forgets last time had a BM.   Prilosec doesn't seem to help. Has taken Protonix in remote past, which seemed to help the most.   Was told had ulcer a long time ago. No EGD  Takes Ibuprofen alternating with Tylenol when taking the Rebif shots.    Recent radiology/labs March 04, 2011 in ED: Normal CT, no anemia on CBC, HFP normal, Normal lipase   Past Medical History  Diagnosis Date  . MS (multiple sclerosis)   . Diverticulitis Sept 2011    CT documented  . Hypertension   . Hyperlipidemia   . Narcolepsy     Past Surgical History  Procedure Date  . Back surgery   . Partial hysterectomy     with removal of one ovary  . Tubal ligation   . Carpal tunnel repair     Current Outpatient Prescriptions  Medication Sig Dispense Refill  . acetaminophen (TYLENOL 8 HOUR) 650 MG CR tablet Take 650 mg by mouth every 8 (eight) hours as needed. For pain       . Armodafinil (NUVIGIL) 150 MG tablet Take 150 mg by mouth daily.        . hydrochlorothiazide (HYDRODIURIL) 25  MG tablet Take 25 mg by mouth daily.        Marland Kitchen ibuprofen (ADVIL,MOTRIN) 800 MG tablet Take 800 mg by mouth every 8 (eight) hours as needed. For pain        . interferon beta-1a (REBIF) 44 MCG/0.5ML injection Inject 44 mcg into the skin 3 (three) times a week.        Marland Kitchen omeprazole (PRILOSEC) 20 MG capsule Take 20 mg by mouth as needed.        . pramipexole (MIRAPEX) 0.25 MG tablet Take 0.25 mg by mouth at bedtime.        . pravastatin (PRAVACHOL) 40 MG tablet Take 40 mg by mouth daily.        Marland Kitchen tiZANidine (ZANAFLEX) 4 MG tablet Take 4 mg by mouth at bedtime.        . pantoprazole (PROTONIX) 40 MG tablet Take 1 tablet (40 mg total) by mouth daily.  30 tablet  1  . polyethylene glycol powder (GLYCOLAX/MIRALAX) powder Take 17 g by mouth daily. As needed for a bowel movement.  255 g  3    Allergies as of 03/11/2011  . (No Known Allergies)    Family History  Problem Relation Age of Onset  . Colon cancer Neg Hx     History   Social History  . Marital Status: Married    Spouse Name: N/A    Number of  Children: N/A  . Years of Education: N/A   Occupational History  . Not on file.   Social History Main Topics  . Smoking status: Never Smoker   . Smokeless tobacco: Not on file  . Alcohol Use: No  . Drug Use: No  . Sexually Active: Not on file   Other Topics Concern  . Not on file   Social History Narrative  . No narrative on file    Review of Systems: Gen: Denies any fever, chills, fatigue, weight loss, lack of appetite.  CV: Denies chest pain, heart palpitations, peripheral edema, syncope.  Resp: Denies shortness of breath at rest or with exertion. Denies wheezing or cough.  GI: Denies dysphagia or odynophagia. Denies jaundice, hematemesis, fecal incontinence. GU : Denies urinary burning, urinary frequency, urinary hesitancy MS: Denies joint pain, muscle weakness, cramps, or limitation of movement.  Derm: Denies rash, itching, dry skin Psych: Denies depression, anxiety, memory  loss, and confusion Heme: Denies bruising, bleeding, and enlarged lymph nodes.  Physical Exam: BP 121/75  Pulse 75  Temp(Src) 98 F (36.7 C) (Temporal)  Ht 5\' 3"  (1.6 m)  Wt 192 lb (87.091 kg)  BMI 34.01 kg/m2 General:   Alert and oriented. Pleasant and cooperative. Well-nourished and well-developed.  Head:  Normocephalic and atraumatic. Eyes:  Without icterus, sclera clear and conjunctiva pink.  Ears:  Normal auditory acuity. Nose:  No deformity, discharge,  or lesions. Mouth:  No deformity or lesions, oral mucosa pink.  Neck:  Supple, without mass or thyromegaly. Lungs:  Clear to auscultation bilaterally. No wheezes, rales, or rhonchi. No distress.  Heart:  S1, S2 present without murmurs appreciated.  Abdomen:  +BS, soft, non-tender and non-distended. No HSM noted. No guarding or rebound. No masses appreciated.  Rectal:  Deferred  Msk:  Symmetrical without gross deformities. Normal posture. Extremities:  Without clubbing or edema. Neurologic:  Alert and  oriented x4;  grossly normal neurologically. Skin:  Intact without significant lesions or rashes. Cervical Nodes:  No significant cervical adenopathy. Psych:  Alert and cooperative. Normal mood and affect.

## 2011-03-11 NOTE — Patient Instructions (Signed)
Stop Prilosec. Start taking Protonix every morning, 30 minutes before breakfast.   Follow a high fiber diet. You may take Miralax on any given day as needed for a bowel movement.  Start taking a Probiotic daily. Samples have been provided as well.  We have set you up for an endoscopy and colonoscopy with Dr. Darrick Penna in the near future. Further recommendations to follow once this is completed.

## 2011-03-12 DIAGNOSIS — K5900 Constipation, unspecified: Secondary | ICD-10-CM | POA: Insufficient documentation

## 2011-03-12 DIAGNOSIS — R1013 Epigastric pain: Secondary | ICD-10-CM | POA: Insufficient documentation

## 2011-03-12 DIAGNOSIS — K5792 Diverticulitis of intestine, part unspecified, without perforation or abscess without bleeding: Secondary | ICD-10-CM | POA: Insufficient documentation

## 2011-03-12 NOTE — Assessment & Plan Note (Signed)
45 year old female with one year history epigastric and RUQ pain associated with belching, severe reflux, food regurgitation, nausea and aggravated by eating/drinking. CT in Oct 2012 normal as well as LFTs and Lipase. Gallbladder remains in situ. Taking Prilosec prn. Due to symptoms, will need evaluation via EGD in near future. Unable to exclude biliary etiology at this time.   Proceed with upper endoscopy in the near future with Dr. Darrick Penna. This will be performed with Propofol in the OR due to polypharmacy. The risks, benefits, and alternatives have been discussed in detail with patient. They have stated understanding and desire to proceed.  Switch to Protonix daily, as pt states she had good results with this in the past Consider US/HIDA if findings unrevealing

## 2011-03-12 NOTE — Assessment & Plan Note (Signed)
Chronic constipation. Add high fiber diet, Miralax on any given day. See "diverticulitis".

## 2011-03-12 NOTE — Assessment & Plan Note (Signed)
CT documented diverticulitis in Sept 2011. No further episodes. No prior colonoscopy. Will need evaluation of lower GI tract at time of EGD. Again, this will be done in the OR with Propofol due to hx of polypharmacy. A bowel regimen was discussed with the patient to include a high fiber diet, probiotic, and Miralax prn.  Proceed with colonoscopy with Dr. Darrick Penna in the near future. The risks, benefits, and alternatives have been discussed in detail with the patient. They state understanding and desire to proceed.

## 2011-03-13 NOTE — Progress Notes (Signed)
Cc to PCP 

## 2011-03-30 ENCOUNTER — Ambulatory Visit: Payer: Self-pay | Admitting: Neurosurgery

## 2011-04-01 ENCOUNTER — Other Ambulatory Visit: Payer: Self-pay

## 2011-04-01 ENCOUNTER — Encounter (HOSPITAL_COMMUNITY): Payer: Self-pay

## 2011-04-01 ENCOUNTER — Encounter (HOSPITAL_COMMUNITY)
Admission: RE | Admit: 2011-04-01 | Discharge: 2011-04-01 | Disposition: A | Discharge status: Home / Self Care | Payer: Medicare Other | Source: Ambulatory Visit | Attending: Gastroenterology | Admitting: Gastroenterology

## 2011-04-01 HISTORY — DX: Gastro-esophageal reflux disease without esophagitis: K21.9

## 2011-04-01 HISTORY — DX: Anxiety disorder, unspecified: F41.9

## 2011-04-01 NOTE — Patient Instructions (Addendum)
22 Jill English  04/01/2011   Your procedure is scheduled on:  04/07/11  Report to [facility] at 06:30 AM.  Call this number if you have problems the morning of surgery: [phone]   Remember:   Do not eat food:After Midnight.  Do not drink clear liquids: After Midnight.  Take these medicines the morning of surgery with A SIP OF WATER: Protonix, Prilosec, Zanaflex, Mirapex   Do not wear jewelry, make-up or nail polish.  Do not wear lotions, powders, or perfumes. You may wear deodorant.  Do not shave 48 hours prior to surgery.  Do not bring valuables to the hospital.  Contacts, dentures or bridgework may not be worn into surgery.  Leave suitcase in the car. After surgery it may be brought to your room.  For patients admitted to the hospital, checkout time is 11:00 AM the day of discharge.   Patients discharged the day of surgery will not be allowed to drive home.  Name and phone number of your driver:   Special Instructions: N/A   Please read over the following fact sheets that you were given: Anesthesia Post-op Instructions and Care and Recovery After Surgery     Colonoscopy A colonoscopy is an exam to evaluate your entire colon. In this exam, your colon is cleansed. A long fiberoptic tube is inserted through your rectum and into your colon. The fiberoptic scope (endoscope) is a long bundle of enclosed and very flexible fibers. These fibers transmit light to the area examined and send images from that area to your caregiver. Discomfort is usually minimal. You may be given a drug to help you sleep (sedative) during or prior to the procedure. This exam helps to detect lumps (tumors), polyps, inflammation, and areas of bleeding. Your caregiver may also take a small piece of tissue (biopsy) that will be examined under a microscope. LET YOUR CAREGIVER KNOW ABOUT:   Allergies to food or medicine.   Medicines taken, including vitamins, herbs, eyedrops, over-the-counter medicines, and  creams.   Use of steroids (by mouth or creams).   Previous problems with anesthetics or numbing medicines.   History of bleeding problems or blood clots.   Previous surgery.   Other health problems, including diabetes and kidney problems.   Possibility of pregnancy, if this applies.  BEFORE THE PROCEDURE   A clear liquid diet may be required for 2 days before the exam.   Ask your caregiver about changing or stopping your regular medications.   Liquid injections (enemas) or laxatives may be required.   A large amount of electrolyte solution may be given to you to drink over a short period of time. This solution is used to clean out your colon.   You should be present 60 minutes prior to your procedure or as directed by your caregiver.  AFTER THE PROCEDURE   If you received a sedative or pain relieving medication, you will need to arrange for someone to drive you home.   Occasionally, there is a little blood passed with the first bowel movement. Do not be concerned.  FINDING OUT THE RESULTS OF YOUR TEST Not all test results are available during your visit. If your test results are not back during the visit, make an appointment with your caregiver to find out the results. Do not assume everything is normal if you have not heard from your caregiver or the medical facility. It is important for you to follow up on all of your test results. HOME CARE INSTRUCTIONS  It is not unusual to pass moderate amounts of gas and experience mild abdominal cramping following the procedure. This is due to air being used to inflate your colon during the exam. Walking or a warm pack on your belly (abdomen) may help.   You may resume all normal meals and activities after sedatives and medicines have worn off.   Only take over-the-counter or prescription medicines for pain, discomfort, or fever as directed by your caregiver. Do not use aspirin or blood thinners if a biopsy was taken. Consult your  caregiver for medicine usage if biopsies were taken.  SEEK IMMEDIATE MEDICAL CARE IF:   You have a fever.   You pass large blood clots or fill a toilet with blood following the procedure. This may also occur 10 to 14 days following the procedure. This is more likely if a biopsy was taken.   You develop abdominal pain that keeps getting worse and cannot be relieved with medicine.  Document Released: 05/09/2000 Document Revised: 01/22/2011 Document Reviewed: 12/23/2007 [facility] Patient Information 2012 [place], [place].     Esophagogastroduodenoscopy This is an endoscopic procedure (a procedure that uses a device like a flexible telescope) that allows your caregiver to view the upper stomach and small bowel. This test allows your caregiver to look at the esophagus. The esophagus carries food from your mouth to your stomach. They can also look at your duodenum. This is the first part of the small intestine that attaches to the stomach. This test is used to detect problems in the bowel such as ulcers and inflammation. PREPARATION FOR TEST Nothing to eat after midnight the day before the test. NORMAL FINDINGS Normal esophagus, stomach, and duodenum. Ranges for normal findings may vary among different laboratories and hospitals. You should always check with your doctor after having lab work or other tests done to discuss the meaning of your test results and whether your values are considered within normal limits. MEANING OF TEST  Your caregiver will go over the test results with you and discuss the importance and meaning of your results, as well as treatment options and the need for additional tests if necessary. OBTAINING THE TEST RESULTS It is your responsibility to obtain your test results. Ask the lab or department performing the test when and how you will get your results. Document Released: 09/12/2004 Document Revised: 01/22/2011 Document Reviewed: 04/21/2008 [facility] Patient Information  2012 [place], [place].     PATIENT INSTRUCTIONS POST-ANESTHESIA  IMMEDIATELY FOLLOWING SURGERY:  Do not drive or operate machinery for the first twenty four hours after surgery.  Do not make any important decisions for twenty four hours after surgery or while taking narcotic pain medications or sedatives.  If you develop intractable nausea and vomiting or a severe headache please notify your doctor immediately.  FOLLOW-UP:  Please make an appointment with your surgeon as instructed. You do not need to follow up with anesthesia unless specifically instructed to do so.  WOUND CARE INSTRUCTIONS (if applicable):  Keep a dry clean dressing on the anesthesia/puncture wound site if there is drainage.  Once the wound has quit draining you may leave it open to air.  Generally you should leave the bandage intact for twenty four hours unless there is drainage.  If the epidural site drains for more than 36-48 hours please call the anesthesia department.  QUESTIONS?:  Please feel free to call your physician or the hospital operator if you have any questions, and they will be happy to assist you.  [facility] Anesthesia Department [address] [place] [phone]

## 2011-04-07 ENCOUNTER — Other Ambulatory Visit: Payer: Self-pay | Admitting: Gastroenterology

## 2011-04-07 ENCOUNTER — Encounter (HOSPITAL_COMMUNITY)
Admission: RE | Disposition: A | Discharge status: Home / Self Care | Payer: Self-pay | Source: Ambulatory Visit | Attending: Gastroenterology

## 2011-04-07 ENCOUNTER — Encounter (HOSPITAL_COMMUNITY): Payer: Self-pay | Admitting: Anesthesiology

## 2011-04-07 ENCOUNTER — Ambulatory Visit (HOSPITAL_COMMUNITY)
Admission: RE | Admit: 2011-04-07 | Discharge: 2011-04-07 | Disposition: A | Discharge status: Home / Self Care | Payer: Medicare Other | Source: Ambulatory Visit | Attending: Gastroenterology | Admitting: Gastroenterology

## 2011-04-07 ENCOUNTER — Encounter (HOSPITAL_COMMUNITY): Payer: Self-pay

## 2011-04-07 ENCOUNTER — Ambulatory Visit (HOSPITAL_COMMUNITY): Payer: Medicare Other | Admitting: Anesthesiology

## 2011-04-07 DIAGNOSIS — K5730 Diverticulosis of large intestine without perforation or abscess without bleeding: Secondary | ICD-10-CM | POA: Insufficient documentation

## 2011-04-07 DIAGNOSIS — R1013 Epigastric pain: Secondary | ICD-10-CM | POA: Insufficient documentation

## 2011-04-07 DIAGNOSIS — Z79899 Other long term (current) drug therapy: Secondary | ICD-10-CM | POA: Insufficient documentation

## 2011-04-07 DIAGNOSIS — K648 Other hemorrhoids: Secondary | ICD-10-CM | POA: Insufficient documentation

## 2011-04-07 DIAGNOSIS — K6289 Other specified diseases of anus and rectum: Secondary | ICD-10-CM | POA: Insufficient documentation

## 2011-04-07 DIAGNOSIS — K5909 Other constipation: Secondary | ICD-10-CM | POA: Insufficient documentation

## 2011-04-07 DIAGNOSIS — E785 Hyperlipidemia, unspecified: Secondary | ICD-10-CM | POA: Insufficient documentation

## 2011-04-07 DIAGNOSIS — K2970 Gastritis, unspecified, without bleeding: Secondary | ICD-10-CM | POA: Insufficient documentation

## 2011-04-07 DIAGNOSIS — I10 Essential (primary) hypertension: Secondary | ICD-10-CM | POA: Insufficient documentation

## 2011-04-07 HISTORY — PX: COLONOSCOPY: SHX174

## 2011-04-07 HISTORY — PX: ESOPHAGOGASTRODUODENOSCOPY: SHX1529

## 2011-04-07 SURGERY — COLONOSCOPY WITH PROPOFOL
Anesthesia: Monitor Anesthesia Care | Site: Rectum

## 2011-04-07 MED ORDER — PROPOFOL 10 MG/ML IV EMUL
INTRAVENOUS | Status: AC
  Filled 2011-04-07: qty 20

## 2011-04-07 MED ORDER — MIDAZOLAM HCL 2 MG/2ML IJ SOLN
INTRAMUSCULAR | Status: AC
  Administered 2011-04-07: 2 mg via INTRAVENOUS
  Filled 2011-04-07: qty 2

## 2011-04-07 MED ORDER — ONDANSETRON HCL 4 MG/2ML IJ SOLN
4.0000 mg | Freq: Once | INTRAMUSCULAR | Status: AC
  Administered 2011-04-07: 4 mg via INTRAVENOUS

## 2011-04-07 MED ORDER — FENTANYL CITRATE 0.05 MG/ML IJ SOLN
INTRAMUSCULAR | Status: DC | PRN
  Administered 2011-04-07 (×2): 50 ug via INTRAVENOUS

## 2011-04-07 MED ORDER — MIDAZOLAM HCL 2 MG/2ML IJ SOLN
INTRAMUSCULAR | Status: AC
  Filled 2011-04-07: qty 2

## 2011-04-07 MED ORDER — ONDANSETRON HCL 4 MG/2ML IJ SOLN: 4.0000 mg | Freq: Once | INTRAMUSCULAR | Status: DC | PRN

## 2011-04-07 MED ORDER — PROPOFOL 10 MG/ML IV EMUL
INTRAVENOUS | Status: DC | PRN
  Administered 2011-04-07: 75 ug/kg/min via INTRAVENOUS

## 2011-04-07 MED ORDER — STERILE WATER FOR IRRIGATION IR SOLN
Status: DC | PRN
  Administered 2011-04-07: 07:00:00

## 2011-04-07 MED ORDER — WATER FOR IRRIGATION, STERILE IR SOLN
Status: DC | PRN
  Administered 2011-04-07: 1000 mL

## 2011-04-07 MED ORDER — PANTOPRAZOLE SODIUM 40 MG PO TBEC: DELAYED_RELEASE_TABLET | ORAL | Status: DC

## 2011-04-07 MED ORDER — ONDANSETRON HCL 4 MG/2ML IJ SOLN
INTRAMUSCULAR | Status: AC
  Filled 2011-04-07: qty 2

## 2011-04-07 MED ORDER — MIDAZOLAM HCL 5 MG/5ML IJ SOLN
INTRAMUSCULAR | Status: DC | PRN
  Administered 2011-04-07: 2 mg via INTRAVENOUS

## 2011-04-07 MED ORDER — LIDOCAINE HCL (PF) 1 % IJ SOLN
INTRAMUSCULAR | Status: AC
  Filled 2011-04-07: qty 5

## 2011-04-07 MED ORDER — PROPOFOL 10 MG/ML IV EMUL
INTRAVENOUS | Status: DC | PRN
  Administered 2011-04-07: 25 mg via INTRAVENOUS

## 2011-04-07 MED ORDER — POLYETHYLENE GLYCOL 3350 17 G PO PACK: PACK | ORAL | Status: DC

## 2011-04-07 MED ORDER — MIDAZOLAM HCL 2 MG/2ML IJ SOLN
1.0000 mg | INTRAMUSCULAR | Status: DC | PRN
  Administered 2011-04-07: 2 mg via INTRAVENOUS

## 2011-04-07 MED ORDER — LACTATED RINGERS IV SOLN
INTRAVENOUS | Status: DC
  Administered 2011-04-07: 08:00:00 via INTRAVENOUS

## 2011-04-07 MED ORDER — GLYCOPYRROLATE 0.2 MG/ML IJ SOLN
0.2000 mg | Freq: Once | INTRAMUSCULAR | Status: AC
  Administered 2011-04-07: 0.2 mg via INTRAVENOUS

## 2011-04-07 MED ORDER — FENTANYL CITRATE 0.05 MG/ML IJ SOLN: 25.0000 ug | INTRAMUSCULAR | Status: DC | PRN

## 2011-04-07 MED ORDER — FENTANYL CITRATE 0.05 MG/ML IJ SOLN
INTRAMUSCULAR | Status: AC
  Filled 2011-04-07: qty 2

## 2011-04-07 MED ORDER — BUTAMBEN-TETRACAINE-BENZOCAINE 2-2-14 % EX AERO
1.0000 | INHALATION_SPRAY | Freq: Once | CUTANEOUS | Status: AC
  Administered 2011-04-07: 1 via TOPICAL
  Filled 2011-04-07: qty 56

## 2011-04-07 MED ORDER — GLYCOPYRROLATE 0.2 MG/ML IJ SOLN
INTRAMUSCULAR | Status: AC
  Administered 2011-04-07: 0.2 mg via INTRAVENOUS
  Filled 2011-04-07: qty 1

## 2011-04-07 SURGICAL SUPPLY — 25 items
BLOCK BITE 60FR ADLT L/F BLUE (MISCELLANEOUS) ×3 IMPLANT
ELECT REM PT RETURN 9FT ADLT (ELECTROSURGICAL)
ELECTRODE REM PT RTRN 9FT ADLT (ELECTROSURGICAL) IMPLANT
FCP BXJMBJMB 240X2.8X (CUTTING FORCEPS)
FLOOR PAD 36X40 (MISCELLANEOUS) ×3
FORCEP RJ3 GP 1.8X160 W-NEEDLE (CUTTING FORCEPS) IMPLANT
FORCEPS BIOP RAD 4 LRG CAP 4 (CUTTING FORCEPS) ×1 IMPLANT
FORCEPS BIOP RJ4 240 W/NDL (CUTTING FORCEPS)
FORCEPS BXJMBJMB 240X2.8X (CUTTING FORCEPS) IMPLANT
INJECTOR/SNARE I SNARE (MISCELLANEOUS) IMPLANT
LUBRICANT JELLY 4.5OZ STERILE (MISCELLANEOUS) ×1 IMPLANT
MANIFOLD NEPTUNE II (INSTRUMENTS) ×1 IMPLANT
NDL SCLEROTHERAPY 25GX240 (NEEDLE) IMPLANT
NEEDLE SCLEROTHERAPY 25GX240 (NEEDLE) IMPLANT
PAD FLOOR 36X40 (MISCELLANEOUS) ×2 IMPLANT
PROBE APC STR FIRE (PROBE) IMPLANT
PROBE INJECTION GOLD (MISCELLANEOUS)
PROBE INJECTION GOLD 7FR (MISCELLANEOUS) IMPLANT
SNARE ROTATE MED OVAL 20MM (MISCELLANEOUS) IMPLANT
SNARE SHORT THROW 13M SML OVAL (MISCELLANEOUS) IMPLANT
SYR 50ML LL SCALE MARK (SYRINGE) ×1 IMPLANT
TRAP SPECIMEN MUCOUS 40CC (MISCELLANEOUS) IMPLANT
TUBING ENDO SMARTCAP PENTAX (MISCELLANEOUS) ×6 IMPLANT
TUBING IRRIGATION ENDOGATOR (MISCELLANEOUS) ×3 IMPLANT
WATER STERILE IRR 1000ML POUR (IV SOLUTION) ×2 IMPLANT

## 2011-04-07 NOTE — Anesthesia Preprocedure Evaluation (Signed)
Anesthesia Evaluation  Patient identified by MRN, date of birth, ID band Patient awake    Reviewed: Allergy & Precautions, H&P , NPO status , Patient's Chart, lab work & pertinent test results  History of Anesthesia Complications Negative for: history of anesthetic complications  Airway Mallampati: I TM Distance: >3 FB     Dental  (+) Edentulous Upper   Pulmonary neg pulmonary ROS,    Pulmonary exam normal       Cardiovascular hypertension, Pt. on medications Regular Normal    Neuro/Psych PSYCHIATRIC DISORDERS Anxiety Hx Narcolepsy, 2 x week.  Neuromuscular disease (multiple sclerosis - R leg)    GI/Hepatic GERD-  Medicated and Controlled,  Endo/Other    Renal/GU      Musculoskeletal   Abdominal   Peds  Hematology   Anesthesia Other Findings   Reproductive/Obstetrics                           Anesthesia Physical Anesthesia Plan  ASA: III  Anesthesia Plan: MAC   Post-op Pain Management:    Induction:   Airway Management Planned: Simple Face Mask  Additional Equipment:   Intra-op Plan:   Post-operative Plan:   Informed Consent: I have reviewed the patients History and Physical, chart, labs and discussed the procedure including the risks, benefits and alternatives for the proposed anesthesia with the patient or authorized representative who has indicated his/her understanding and acceptance.     Plan Discussed with:   Anesthesia Plan Comments:         Anesthesia Quick Evaluation

## 2011-04-07 NOTE — H&P (Signed)
BP Pulse Temp(Src) Ht Wt BMI    121/75  75  98 F (36.7 C) (Temporal)  5\' 3"  (1.6 m)  192 lb (87.091 kg)  34.01 kg/m2       Progress Notes     Gerrit Halls, NP  03/12/2011  4:27 PM  Signed   Primary Care Physician:  Isac Sarna, MD Primary Gastroenterologist:  Dr. Darrick Penna    Chief Complaint   Patient presents with   .  Diverticulitis      HPI:    Jill English is a self-referral today, desiring a colonoscopy. She has a hx of CT documented diverticulitis in Sept 2011. She reports belching, RUQand epigastric pain intermittent, sometimes worse with eating or drinking, depending on how much food she eats. Symptoms present for about 1 year. She reports reflux, food regurgitation with belching. Feels like food doesn't digest, when spits back up looks like didn't digest. No actual vomiting, +nausea. Takes Prilosec prn.    Intermittent lower abdominal soreness, relieved with BM. Constipation predominant symptoms. Feels like has a fever, stomach seems to hurt worse during these episodes. Bloating, tight abdomen.    Has seen some brbpr, on tissue intermittently. No melena. Will go up to 4 days without BM, sometimes so long she forgets last time had a BM.    Prilosec doesn't seem to help. Has taken Protonix in remote past, which seemed to help the most.    Was told had ulcer a long time ago. No EGD   Takes Ibuprofen alternating with Tylenol when taking the Rebif shots.      Recent radiology/labs March 04, 2011 in ED: Normal CT, no anemia on CBC, HFP normal, Normal lipase      Past Medical History   Diagnosis  Date   .  MS (multiple sclerosis)     .  Diverticulitis  Sept 2011       CT documented   .  Hypertension     .  Hyperlipidemia     .  Narcolepsy         Past Surgical History   Procedure  Date   .  Back surgery     .  Partial hysterectomy         with removal of one ovary   .  Tubal ligation     .  Carpal tunnel repair         Current Outpatient  Prescriptions   Medication  Sig  Dispense  Refill   .  acetaminophen (TYLENOL 8 HOUR) 650 MG CR tablet  Take 650 mg by mouth every 8 (eight) hours as needed. For pain          .  Armodafinil (NUVIGIL) 150 MG tablet  Take 150 mg by mouth daily.           .  hydrochlorothiazide (HYDRODIURIL) 25 MG tablet  Take 25 mg by mouth daily.           Marland Kitchen  ibuprofen (ADVIL,MOTRIN) 800 MG tablet  Take 800 mg by mouth every 8 (eight) hours as needed. For pain            .  interferon beta-1a (REBIF) 44 MCG/0.5ML injection  Inject 44 mcg into the skin 3 (three) times a week.           Marland Kitchen  omeprazole (PRILOSEC) 20 MG capsule  Take 20 mg by mouth as needed.           Marland Kitchen  pramipexole (MIRAPEX) 0.25 MG tablet  Take 0.25 mg by mouth at bedtime.           .  pravastatin (PRAVACHOL) 40 MG tablet  Take 40 mg by mouth daily.           Marland Kitchen  tiZANidine (ZANAFLEX) 4 MG tablet  Take 4 mg by mouth at bedtime.           .  pantoprazole (PROTONIX) 40 MG tablet  Take 1 tablet (40 mg total) by mouth daily.   30 tablet   1   .  polyethylene glycol powder (GLYCOLAX/MIRALAX) powder  Take 17 g by mouth daily. As needed for a bowel movement.   255 g   3       Allergies as of 03/11/2011   .  (No Known Allergies)       Family History   Problem  Relation  Age of Onset   .  Colon cancer  Neg Hx         History       Social History   .  Marital Status:  Married       Spouse Name:  N/A       Number of Children:  N/A   .  Years of Education:  N/A       Occupational History   .  Not on file.       Social History Main Topics   .  Smoking status:  Never Smoker    .  Smokeless tobacco:  Not on file   .  Alcohol Use:  No   .  Drug Use:  No   .  Sexually Active:  Not on file       Other Topics  Concern   .  Not on file       Social History Narrative   .  No narrative on file      Review of Systems: Gen: Denies any fever, chills, fatigue, weight loss, lack of appetite.   CV: Denies chest pain, heart palpitations,  peripheral edema, syncope.   Resp: Denies shortness of breath at rest or with exertion. Denies wheezing or cough.   GI: Denies dysphagia or odynophagia. Denies jaundice, hematemesis, fecal incontinence. GU : Denies urinary burning, urinary frequency, urinary hesitancy MS: Denies joint pain, muscle weakness, cramps, or limitation of movement.   Derm: Denies rash, itching, dry skin Psych: Denies depression, anxiety, memory loss, and confusion Heme: Denies bruising, bleeding, and enlarged lymph nodes.   Physical Exam: BP 121/75  Pulse 75  Temp(Src) 98 F (36.7 C) (Temporal)  Ht 5\' 3"  (1.6 m)  Wt 192 lb (87.091 kg)  BMI 34.01 kg/m2 General:   Alert and oriented. Pleasant and cooperative. Well-nourished and well-developed.   Head:  Normocephalic and atraumatic. Eyes:  Without icterus, sclera clear and conjunctiva pink.   Ears:  Normal auditory acuity. Nose:  No deformity, discharge,  or lesions. Mouth:  No deformity or lesions, oral mucosa pink.   Neck:  Supple, without mass or thyromegaly. Lungs:  Clear to auscultation bilaterally. No wheezes, rales, or rhonchi. No distress.   Heart:  S1, S2 present without murmurs appreciated.   Abdomen:  +BS, soft, non-tender and non-distended. No HSM noted. No guarding or rebound. No masses appreciated.   Rectal:  Deferred   Msk:  Symmetrical without gross deformities. Normal posture. Extremities:  Without clubbing or edema. Neurologic:  Alert and  oriented x4;  grossly normal neurologically. Skin:  Intact without significant lesions or rashes. Cervical Nodes:  No significant cervical adenopathy. Psych:  Alert and cooperative. Normal mood and affect.         Leigh A Watson  03/13/2011 11:10 AM  Signed Cc to PCP     Abdominal pain, epigastric - Gerrit Halls, NP  03/12/2011  4:25 PM  Signed 45 year old female with one year history epigastric and RUQ pain associated with belching, severe reflux, food regurgitation, nausea and aggravated by  eating/drinking. CT in Oct 2012 normal as well as LFTs and Lipase. Gallbladder remains in situ. Taking Prilosec prn. Due to symptoms, will need evaluation via EGD in near future. Unable to exclude biliary etiology at this time.    Proceed with upper endoscopy in the near future with Dr. Darrick Penna. This will be performed with Propofol in the OR due to polypharmacy. The risks, benefits, and alternatives have been discussed in detail with patient. They have stated understanding and desire to proceed.   Switch to Protonix daily, as pt states she had good results with this in the past Consider US/HIDA if findings unrevealing       Constipation - Gerrit Halls, NP  03/12/2011  4:25 PM  Signed Chronic constipation. Add high fiber diet, Miralax on any given day. See "diverticulitis".    Diverticulitis - Gerrit Halls, NP  03/12/2011  4:26 PM  Signed CT documented diverticulitis in Sept 2011. No further episodes. No prior colonoscopy. Will need evaluation of lower GI tract at time of EGD. Again, this will be done in the OR with Propofol due to hx of polypharmacy. A bowel regimen was discussed with the patient to include a high fiber diet, probiotic, and Miralax prn.   Proceed with colonoscopy with Dr. Darrick Penna in the near future. The risks, benefits, and alternatives have been discussed in detail with the patient. They state understanding

## 2011-04-07 NOTE — Anesthesia Postprocedure Evaluation (Signed)
  Anesthesia Post-op Note  Patient: Jill English  Procedure(s) Performed:  COLONOSCOPY WITH PROPOFOL - in cecum at 0808 total withdrawal time = 9 minutes; end at 0818; ESOPHAGOGASTRODUODENOSCOPY (EGD) WITH PROPOFOL - begin at 0821  Patient Location: PACU  Anesthesia Type: MAC  Level of Consciousness: sedated  Airway and Oxygen Therapy: Patient Spontanous Breathing and Patient connected to face mask oxygen  Post-op Pain: none  Post-op Assessment: Post-op Vital signs reviewed, Patient's Cardiovascular Status Stable, Respiratory Function Stable and No signs of Nausea or vomiting  Post-op Vital Signs: Reviewed and stable  Complications: No apparent anesthesia complications

## 2011-04-07 NOTE — Transfer of Care (Signed)
Immediate Anesthesia Transfer of Care Note  Patient: Jill English  Procedure(s) Performed:  COLONOSCOPY WITH PROPOFOL - in cecum at 0808 total withdrawal time = 9 minutes; end at 0818; ESOPHAGOGASTRODUODENOSCOPY (EGD) WITH PROPOFOL - begin at 0821  Patient Location: PACU  Anesthesia Type: MAC  Level of Consciousness: sedated  Airway & Oxygen Therapy: Patient Spontanous Breathing and Patient connected to face mask oxygen  Post-op Assessment: Report given to PACU RN  Post vital signs: Reviewed and stable  Complications: No apparent anesthesia complications

## 2011-04-07 NOTE — Interval H&P Note (Signed)
History and Physical Interval Note:   04/07/2011   7:47 AM   Jill English  has presented today for surgery, with the diagnosis of constipation Gastroesophaeal Reflux Disease hx of diverticulitis  The various methods of treatment have been discussed with the patient and family. After consideration of risks, benefits and other options for treatment, the patient has consented to  Procedure(s): COLONOSCOPY WITH PROPOFOL ESOPHAGOGASTRODUODENOSCOPY (EGD) WITH PROPOFOL as a surgical intervention .  The patients' history has been reviewed, patient examined, no change in status, stable for surgery.  I have reviewed the patients' chart and labs.  Questions were answered to the patient's satisfaction.     [name]  MD  THE PATIENT WAS EXAMINED AND THERE IS NO CHANGE IN THE PATIENT'S CONDITION SINCE THE ORIGINAL H&P WAS COMPLETED.

## 2011-04-08 ENCOUNTER — Encounter: Payer: Self-pay | Admitting: Gastroenterology

## 2011-04-08 ENCOUNTER — Telehealth: Payer: Self-pay | Admitting: Gastroenterology

## 2011-04-08 NOTE — Telephone Encounter (Signed)
Pt is scheduled to see Dr Lovell Sheehan on 04/22/11 @ 1:30 for consult regarding hemorrhoidectomy at the request of Dr Darrick Penna- LMOVM for pt and also mailed letter

## 2011-04-14 ENCOUNTER — Telehealth: Payer: Self-pay | Admitting: Gastroenterology

## 2011-04-14 NOTE — Telephone Encounter (Signed)
Please call pt. HER stomach Bx shows mild gastritis. Continue PROTONIX 30 minutes prior to meals ONCE DAILY. AVOID IBUPROFEN UNTIL DEC 1. USE TYLENOL PRN FOR PAIN. OPV IN 3 MOS.

## 2011-04-22 NOTE — H&P (Signed)
  NTS SOAP Note  Vital Signs:  Vitals as of: 04/22/2011: Systolic 154: Diastolic 88: Heart Rate 79: Temp 96.19F: Height 42ft 3in: Weight 192Lbs 0 Ounces: Pain Level 3: BMI 34  BMI : 34.01 kg/m2  Subjective: This 45 Years 62 Months old Female presents for of  HEMORRHOIDS: ,Has had intermittent episodes of bleeding per rectum for many years.  Was constipated in the past, but that has since resolved.  Occassionally has blood per rectum.  Review of Symptoms:  Constitutional:unremarkable Head:unremarkable, except for headache Eyes:unremarkable sinus Cardiovascular:unremarkable Respiratory:unremarkable Gastrointestinal:unremarkable Genitourinary:unremarkable Musculoskeletal:unremarkable Skin:unremarkable Hematolgic/Lymphatic:unremarkable Allergic/Immunologic:unremarkable   Past Medical History:Reviewed   Past Medical History  Surgical History: TAH, back Medical Problems:  High Blood pressure Allergies: nkda Medications: HCTZ   Social History:Reviewed   Social History  Preferred Language: English ([place]) Ethnicity: Not Hispanic / Latino Age: 45 Years 3 Months Alcohol:  No Recreational drug(s):  No   Smoking Status: Never smoker reviewed on 04/22/2011  Family History:Reviewed   Family History              Father:  Cancer-prostate    Objective Information: General:Well appearing, well nourished in no distress. Head:Atraumatic; no masses; no abnormalities Heart:RRR, no murmur or gallop.  Normal S1, S2.  No S3, S4.  Lungs:CTA bilaterally, no wheezes, rhonchi, rales.  Breathing unlabored. Abdomen:Soft, NT/ND, no HSM, no masses. Multiple internal with associated external hemorrhoids.  Especially at the 4 and 7 o'clock position.  Assessment:Hemorrhoidal disease  Diagnosis &amp; Procedure: DiagnosisCode: 455.2, ProcedureCode: 65784,    Plan:Scheduled for hemorrhoidectomy on  05/05/11.   Patient Education:Alternative treatments to surgery were discussed with patient (and family).Risks and benefits  of procedure were fully explained to the patient (and family) who gave informed consent. Patient/family questions were addressed.  Follow-up:Pending Surgery

## 2011-04-22 NOTE — Telephone Encounter (Signed)
Informed pt .

## 2011-04-24 NOTE — Progress Notes (Signed)
NOV 2012: EGD GASTRITIS CONTINUE PROTONIX QD & TCS [place] TICS, IH HIGH FIBER DIET

## 2011-04-24 NOTE — Telephone Encounter (Signed)
Pt is aware of OV on 12/17 at 9 with AS and also a reminder to follow up in 3 months with SF is in epic

## 2011-04-25 ENCOUNTER — Encounter (HOSPITAL_COMMUNITY): Payer: Self-pay | Admitting: Pharmacy Technician

## 2011-04-28 NOTE — Patient Instructions (Addendum)
22 Jill English  04/28/2011   Your procedure is scheduled on:  05/05/2011  Report to [facility] at 07:15 AM.  Call this number if you have problems the morning of surgery: [phone]   Remember:   Do not eat food:After Midnight.  May have clear liquids:until Midnight .  Clear liquids include soda, tea, black coffee, apple or grape juice, broth.  Take these medicines the morning of surgery with A SIP OF WATER: Protonix, Mirapex and Tizanidine.   Do not wear jewelry, make-up or nail polish.  Do not wear lotions, powders, or perfumes. You may wear deodorant.  Do not shave 48 hours prior to surgery.  Do not bring valuables to the hospital.  Contacts, dentures or bridgework may not be worn into surgery.  Leave suitcase in the car. After surgery it may be brought to your room.  For patients admitted to the hospital, checkout time is 11:00 AM the day of discharge.   Patients discharged the day of surgery will not be allowed to drive home.  Name and phone number of your driver:   Special Instructions: CHG Shower Use Special Wash: 1/2 bottle night before surgery and 1/2 bottle morning of surgery.   Please read over the following fact sheets that you were given:     Hemorrhoids Hemorrhoids are enlarged (dilated) veins around the rectum. There are 2 types of hemorrhoids, and the type of hemorrhoid is determined by its location. Internal hemorrhoids occur in the veins just inside the rectum.They are usually not painful, but they may bleed.However, they may poke through to the outside and become irritated and painful. External hemorrhoids involve the veins outside the anus and can be felt as a painful swelling or hard lump near the anus.They are often itchy and may crack and bleed. Sometimes clots will form in the veins. This makes them swollen and painful. These are called thrombosed hemorrhoids. CAUSES Causes of hemorrhoids include:  Pregnancy. This increases the pressure in the  hemorrhoidal veins.   Constipation.   Straining to have a bowel movement.   Obesity.   Heavy lifting or other activity that caused you to strain.  TREATMENT Most of the time hemorrhoids improve in 1 to 2 weeks. However, if symptoms do not seem to be getting better or if you have a lot of rectal bleeding, your caregiver may perform a procedure to help make the hemorrhoids get smaller or remove them completely.Possible treatments include:  Rubber band ligation. A rubber band is placed at the base of the hemorrhoid to cut off the circulation.   Sclerotherapy. A chemical is injected to shrink the hemorrhoid.   Infrared light therapy. Tools are used to burn the hemorrhoid.   Hemorrhoidectomy. This is surgical removal of the hemorrhoid.  HOME CARE INSTRUCTIONS   Increase fiber in your diet. Ask your caregiver about using fiber supplements.   Drink enough water and fluids to keep your urine clear or pale yellow.   Exercise regularly.   Go to the bathroom when you have the urge to have a bowel movement. Do not wait.   Avoid straining to have bowel movements.   Keep the anal area dry and clean.   Only take over-the-counter or prescription medicines for pain, discomfort, or fever as directed by your caregiver.  If your hemorrhoids are thrombosed:  Take warm sitz baths for 20 to 30 minutes, 3 to 4 times per day.   If the hemorrhoids are very tender and swollen, place ice packs on  the area as tolerated. Using ice packs between sitz baths may be helpful. Fill a plastic bag with ice. Place a towel between the bag of ice and your skin.   Medicated creams and suppositories may be used or applied as directed.   Do not use a donut-shaped pillow or sit on the toilet for long periods. This increases blood pooling and pain.  SEEK MEDICAL CARE IF:   You have increasing pain and swelling that is not controlled with your medicine.   You have uncontrolled bleeding.   You have difficulty or  you are unable to have a bowel movement.   You have pain or inflammation outside the area of the hemorrhoids.   You have chills or an oral temperature above 102 F (38.9 C).  MAKE SURE YOU:   Understand these instructions.   Will watch your condition.   Will get help right away if you are not doing well or get worse.  Document Released: 05/09/2000 Document Revised: 01/22/2011 Document Reviewed: 09/14/2007 [facility] Patient Information 2012 [place], [place].   PATIENT INSTRUCTIONS POST-ANESTHESIA  IMMEDIATELY FOLLOWING SURGERY:  Do not drive or operate machinery for the first twenty four hours after surgery.  Do not make any important decisions for twenty four hours after surgery or while taking narcotic pain medications or sedatives.  If you develop intractable nausea and vomiting or a severe headache please notify your doctor immediately.  FOLLOW-UP:  Please make an appointment with your surgeon as instructed. You do not need to follow up with anesthesia unless specifically instructed to do so.  WOUND CARE INSTRUCTIONS (if applicable):  Keep a dry clean dressing on the anesthesia/puncture wound site if there is drainage.  Once the wound has quit draining you may leave it open to air.  Generally you should leave the bandage intact for twenty four hours unless there is drainage.  If the epidural site drains for more than 36-48 hours please call the anesthesia department.  QUESTIONS?:  Please feel free to call your physician or the hospital operator if you have any questions, and they will be happy to assist you.     [facility] Anesthesia Department [address] [place] [phone]

## 2011-04-29 ENCOUNTER — Encounter (HOSPITAL_COMMUNITY): Payer: Self-pay

## 2011-04-29 ENCOUNTER — Encounter (HOSPITAL_COMMUNITY)
Admission: RE | Admit: 2011-04-29 | Discharge: 2011-04-29 | Disposition: A | Discharge status: Home / Self Care | Payer: Medicare Other | Source: Ambulatory Visit | Attending: General Surgery | Admitting: General Surgery

## 2011-04-29 DIAGNOSIS — R1013 Epigastric pain: Secondary | ICD-10-CM | POA: Insufficient documentation

## 2011-04-29 DIAGNOSIS — K5732 Diverticulitis of large intestine without perforation or abscess without bleeding: Secondary | ICD-10-CM | POA: Insufficient documentation

## 2011-04-29 DIAGNOSIS — K5900 Constipation, unspecified: Secondary | ICD-10-CM | POA: Insufficient documentation

## 2011-04-29 LAB — DIFFERENTIAL
Basophils Absolute: 0 10*3/uL (ref 0.0–0.1)
Eosinophils Absolute: 0.1 10*3/uL (ref 0.0–0.7)
Lymphs Abs: 1.9 10*3/uL (ref 0.7–4.0)
Neutrophils Relative %: 55 % (ref 43–77)

## 2011-04-29 LAB — BASIC METABOLIC PANEL
Calcium: 9.5 mg/dL (ref 8.4–10.5)
GFR calc non Af Amer: 75 mL/min — ABNORMAL LOW (ref >90)
Sodium: 139 mEq/L (ref 135–145)

## 2011-04-29 LAB — CBC
MCH: 26.9 pg (ref 26.0–34.0)
Platelets: 309 10*3/uL (ref 150–400)
RBC: 5.01 MIL/uL (ref 3.87–5.11)
WBC: 5.4 10*3/uL (ref 4.0–10.5)

## 2011-04-29 LAB — SURGICAL PCR SCREEN: Staphylococcus aureus: NEGATIVE

## 2011-05-05 ENCOUNTER — Ambulatory Visit (HOSPITAL_COMMUNITY): Admission: RE | Admit: 2011-05-05 | Payer: Medicare Other | Source: Ambulatory Visit | Admitting: General Surgery

## 2011-05-05 ENCOUNTER — Encounter (HOSPITAL_COMMUNITY): Admission: RE | Payer: Self-pay | Source: Ambulatory Visit

## 2011-05-05 SURGERY — HEMORRHOIDECTOMY
Anesthesia: General

## 2011-05-08 ENCOUNTER — Encounter: Payer: Self-pay | Admitting: Gastroenterology

## 2011-05-12 ENCOUNTER — Ambulatory Visit: Payer: Medicare Other | Admitting: Gastroenterology

## 2011-05-14 ENCOUNTER — Ambulatory Visit: Payer: Medicare Other | Admitting: Gastroenterology

## 2011-05-23 ENCOUNTER — Encounter (HOSPITAL_COMMUNITY): Payer: Self-pay | Admitting: Pharmacy Technician

## 2011-05-29 ENCOUNTER — Encounter (HOSPITAL_COMMUNITY)
Admission: RE | Admit: 2011-05-29 | Discharge: 2011-05-29 | Disposition: A | Discharge status: Home / Self Care | Payer: Medicare Other | Source: Ambulatory Visit | Attending: General Surgery | Admitting: General Surgery

## 2011-05-29 ENCOUNTER — Encounter (HOSPITAL_COMMUNITY): Payer: Self-pay

## 2011-05-29 MED ORDER — ENOXAPARIN SODIUM 40 MG/0.4ML ~~LOC~~ SOLN: 40.0000 mg | Freq: Once | SUBCUTANEOUS | Status: DC

## 2011-05-29 MED ORDER — SODIUM CHLORIDE 0.9 % IV SOLN: 1.0000 g | INTRAVENOUS | Status: DC

## 2011-05-29 NOTE — Pre-Procedure Instructions (Addendum)
Labs from 04/29/2011 shown to Dr Jayme Cloud. Ok to use these labs from 06/02/2011.This note was written by [facility] RN.

## 2011-06-02 ENCOUNTER — Emergency Department (HOSPITAL_COMMUNITY): Payer: Medicare Other

## 2011-06-02 ENCOUNTER — Encounter (HOSPITAL_COMMUNITY): Admission: RE | Payer: Self-pay | Source: Ambulatory Visit

## 2011-06-02 ENCOUNTER — Emergency Department (HOSPITAL_COMMUNITY)
Admission: EM | Admit: 2011-06-02 | Discharge: 2011-06-02 | Disposition: A | Discharge status: Home / Self Care | Payer: Medicare Other | Attending: Emergency Medicine | Admitting: Emergency Medicine

## 2011-06-02 ENCOUNTER — Encounter (HOSPITAL_COMMUNITY): Payer: Self-pay | Admitting: *Deleted

## 2011-06-02 ENCOUNTER — Ambulatory Visit (HOSPITAL_COMMUNITY): Admission: RE | Admit: 2011-06-02 | Payer: Medicare Other | Source: Ambulatory Visit | Admitting: General Surgery

## 2011-06-02 DIAGNOSIS — I10 Essential (primary) hypertension: Secondary | ICD-10-CM | POA: Insufficient documentation

## 2011-06-02 DIAGNOSIS — J111 Influenza due to unidentified influenza virus with other respiratory manifestations: Secondary | ICD-10-CM | POA: Insufficient documentation

## 2011-06-02 DIAGNOSIS — K219 Gastro-esophageal reflux disease without esophagitis: Secondary | ICD-10-CM | POA: Insufficient documentation

## 2011-06-02 DIAGNOSIS — E785 Hyperlipidemia, unspecified: Secondary | ICD-10-CM | POA: Insufficient documentation

## 2011-06-02 DIAGNOSIS — F411 Generalized anxiety disorder: Secondary | ICD-10-CM | POA: Insufficient documentation

## 2011-06-02 DIAGNOSIS — G35 Multiple sclerosis: Secondary | ICD-10-CM | POA: Insufficient documentation

## 2011-06-02 SURGERY — HEMORRHOIDECTOMY
Anesthesia: General

## 2011-06-02 MED ORDER — HYDROCODONE-ACETAMINOPHEN 5-325 MG PO TABS
1.0000 | ORAL_TABLET | Freq: Four times a day (QID) | ORAL | Status: AC | PRN
Start: 2011-06-02 — End: 2011-06-12

## 2011-06-02 MED ORDER — NAPROXEN 500 MG PO TABS: 500.0000 mg | ORAL_TABLET | Freq: Two times a day (BID) | ORAL | Status: DC

## 2011-06-02 NOTE — ED Notes (Signed)
C/o cold and cough symptoms for a week, has MS, also has tingling right side of face, bilateral chest pain, hurts to cough

## 2011-06-02 NOTE — ED Provider Notes (Signed)
History     CSN: [id number]  Arrival date & time 06/02/11  1154   First MD Initiated Contact with Patient 06/02/11 1313      Chief Complaint  Patient presents with  . Cough    (Consider location/radiation/quality/duration/timing/severity/associated sxs/prior treatment) Patient is a 46 y.o. female presenting with cough. The history is provided by the patient.  Cough The current episode started more than 2 days ago (6 days ago). The problem occurs constantly. The problem has not changed since onset.The cough is productive of sputum. Maximum temperature: Patient with feeling of fever but not documented,  The fever has been present for 3 to 4 days. Associated symptoms include chest pain and myalgias. Pertinent negatives include no ear pain, no headaches, no rhinorrhea, no sore throat, no shortness of breath, no wheezing and no eye redness. She is not a smoker.   Positive sick contacts other family members have similar illness. Symptoms are not being worse chest discomfort with cough bilaterally and particularly lower trimming the muscle soreness. Cough is rarely productive no blood.  Past Medical History  Diagnosis Date  . MS (multiple sclerosis)   . Diverticulitis Sept 2011    CT documented  . Hypertension   . Hyperlipidemia   . Narcolepsy   . GERD (gastroesophageal reflux disease)   . Anxiety   . Hemorrhoids     Past Surgical History  Procedure Date  . Partial hysterectomy 2008    with removal of rt ovary  . Carpal tunnel repair 2009  . Tubal ligation 1992  . Back surgery 2004  . Esophagogastroduodenoscopy 04/07/11    no evidence of h pylori/gastric body and antral mucosa with minimal chronic inflammation  . Colonoscopy 04/07/11    large internal hemorrhoids/diverticula in the sigmoid colon    Family History  Problem Relation Age of Onset  . Colon cancer Neg Hx   . Anesthesia problems Neg Hx   . Hypotension Neg Hx   . Malignant hyperthermia Neg Hx   . Pseudochol  deficiency Neg Hx     History  Substance Use Topics  . Smoking status: Never Smoker   . Smokeless tobacco: Not on file  . Alcohol Use: No    OB History    Grav Para Term Preterm Abortions TAB SAB Ect Mult Living                  Review of Systems  Constitutional: Positive for fever and fatigue.  HENT: Positive for congestion. Negative for ear pain, sore throat, rhinorrhea and neck pain.   Eyes: Negative for redness.  Respiratory: Positive for cough. Negative for shortness of breath and wheezing.   Cardiovascular: Positive for chest pain. Negative for leg swelling.  Gastrointestinal: Positive for nausea. Negative for vomiting, abdominal pain and diarrhea.  Genitourinary: Negative for dysuria and hematuria.  Musculoskeletal: Positive for myalgias. Negative for back pain.  Skin: Negative for rash.  Neurological: Negative for headaches.  Hematological: Does not bruise/bleed easily.  Psychiatric/Behavioral: Negative for confusion.    Allergies  Review of patient's allergies indicates no known allergies.  Home Medications   Current Outpatient Rx  Name Route Sig Dispense Refill  . ACETAMINOPHEN ER 650 MG PO TBCR Oral Take 650 mg by mouth every 8 (eight) hours as needed. For pain     . DESONIDE 0.05 % EX LOTN Topical Apply 1 application topically Twice daily as needed. Apply to scalp if a flare-up of alopecia    . FLUOCINOLONE ACETONIDE SCALP 0.01 %  EX OIL Topical Apply 1 application topically 2 (two) times daily as needed. Using to control alopecia and also for flare-ups    . HYDROCHLOROTHIAZIDE 25 MG PO TABS Oral Take 25 mg by mouth daily.      Marland Kitchen HYDROCODONE-ACETAMINOPHEN 5-325 MG PO TABS Oral Take 1-2 tablets by mouth every 6 (six) hours as needed for pain. 10 tablet 0  . IBUPROFEN 800 MG PO TABS Oral Take 800 mg by mouth every 8 (eight) hours as needed. For pain      . INTERFERON BETA-1A 44 MCG/0.5ML [place] SOLN Subcutaneous Inject 44 mcg into the skin 3 (three) times a week.  Take on mondays,wednesdays and fridays     . NAPROXEN 500 MG PO TABS Oral Take 1 tablet (500 mg total) by mouth 2 (two) times daily. 14 tablet 0    BP 157/86  Pulse 114  Temp(Src) 98.4 F (36.9 C) (Oral)  Resp 21  Ht 5\' 3"  (1.6 m)  Wt 191 lb (86.637 kg)  BMI 33.83 kg/m2  Physical Exam  Nursing note and vitals reviewed. Constitutional: She is oriented to person, place, and time. She appears well-developed and well-nourished. No distress.  HENT:  Head: Normocephalic and atraumatic.  Mouth/Throat: Oropharynx is clear and moist.  Eyes: Conjunctivae and EOM are normal. Pupils are equal, round, and reactive to light.  Neck: Normal range of motion. Neck supple.  Cardiovascular: Normal rate, regular rhythm and normal heart sounds.   No murmur heard. Pulmonary/Chest: Effort normal and breath sounds normal. No respiratory distress. She has no wheezes. She has no rales. She exhibits no tenderness.  Abdominal: Soft. Bowel sounds are normal. There is no tenderness.  Musculoskeletal: Normal range of motion. She exhibits no edema.  Lymphadenopathy:    She has no cervical adenopathy.  Neurological: She is alert and oriented to person, place, and time. No cranial nerve deficit. She exhibits normal muscle tone. Coordination normal.  Skin: Skin is warm and dry. No rash noted.    ED Course  Procedures (including critical care time)  Labs Reviewed - No data to display Dg Chest 2 View  06/02/2011  *RADIOLOGY REPORT*  Clinical Data: Cough and fever.  CHEST - 2 VIEW  Comparison: Chest x-ray 03/04/2011  Findings: Lung volumes are normal.  No consolidative airspace disease.  No pleural effusions.  Pulmonary vasculature and the cardiomediastinal silhouette are within normal limits.  IMPRESSION: No radiographic evidence of acute cardiopulmonary disease.  Original Report Authenticated By: Florencia Reasons, M.D.     1. Influenza       MDM   Chest x-ray negative for pneumonia. One-week history of  symptoms consistent with upper respiratory infection or most likely influenza other family members have similar illness. Will treat symptomatically with Naprosyn for bodyaches Norco for more severe bodyaches and Robitussin-DM for cough. Patient should start to get better at this point in time since his been 6 days worth of symptoms.         Shelda Jakes, MD 06/02/11 [phone]

## 2011-06-09 ENCOUNTER — Ambulatory Visit: Payer: Medicare Other | Admitting: Gastroenterology

## 2011-06-12 ENCOUNTER — Ambulatory Visit: Payer: Medicare Other | Admitting: Gastroenterology

## 2011-06-12 ENCOUNTER — Telehealth: Payer: Self-pay | Admitting: Gastroenterology

## 2011-06-12 NOTE — Telephone Encounter (Signed)
PT WAS A NO SHOW 

## 2011-07-08 NOTE — H&P (Signed)
  NTS SOAP Note  Vital Signs:  Vitals as of: 04/22/2011: Systolic 154: Diastolic 88: Heart Rate 79: Temp 96.26F: Height 79ft 3in: Weight 192Lbs 0 Ounces: Pain Level 3: BMI 34  BMI : 34.01 kg/m2  Subjective: This 46 Years 30 Months old Female presents for of  HEMORRHOIDS: ,Has had intermittent episodes of bleeding per rectum for many years.  Was constipated in the past, but that has since resolved.  Occassionally has blood per rectum.  Review of Symptoms:  Constitutional:unremarkable Head:unremarkable, except for headache Eyes:unremarkable sinus Cardiovascular:unremarkable Respiratory:unremarkable Gastrointestinal:unremarkable Genitourinary:unremarkable Musculoskeletal:unremarkable Skin:unremarkable Hematolgic/Lymphatic:unremarkable Allergic/Immunologic:unremarkable   Past Medical History:Reviewed   Past Medical History  Surgical History: TAH, back Medical Problems:  High Blood pressure Allergies: nkda Medications: HCTZ   Social History:Reviewed   Social History  Preferred Language: English ([place]) Ethnicity: Not Hispanic / Latino Age: 46 Years 3 Months Alcohol:  No Recreational drug(s):  No   Smoking Status: Never smoker reviewed on 04/22/2011  Family History:Reviewed   Family History              Father:  Cancer-prostate    Objective Information: General:Well appearing, well nourished in no distress. Head:Atraumatic; no masses; no abnormalities Heart:RRR, no murmur or gallop.  Normal S1, S2.  No S3, S4.  Lungs:CTA bilaterally, no wheezes, rhonchi, rales.  Breathing unlabored. Abdomen:Soft, NT/ND, no HSM, no masses. Multiple internal with associated external hemorrhoids.  Especially at the 4 and 7 o'clock position.  Assessment:Hemorrhoidal disease  Diagnosis &amp; Procedure: DiagnosisCode: 455.2, ProcedureCode: 16109,    Plan:Scheduled for hemorrhoidectomy on  07/21/11.   Patient Education:Alternative treatments to surgery were discussed with patient (and family).Risks and benefits  of procedure were fully explained to the patient (and family) who gave informed consent. Patient/family questions were addressed.  Follow-up:Pending Surgery

## 2011-07-14 ENCOUNTER — Telehealth: Payer: Self-pay | Admitting: Gastroenterology

## 2011-07-14 ENCOUNTER — Ambulatory Visit: Payer: Medicare Other | Admitting: Gastroenterology

## 2011-07-14 NOTE — Telephone Encounter (Signed)
Pt was a no show

## 2011-07-15 ENCOUNTER — Encounter (HOSPITAL_COMMUNITY): Payer: Self-pay | Admitting: Pharmacy Technician

## 2011-07-16 ENCOUNTER — Encounter (HOSPITAL_COMMUNITY): Admission: RE | Admit: 2011-07-16 | Payer: Medicare Other | Source: Ambulatory Visit

## 2011-07-18 ENCOUNTER — Encounter (HOSPITAL_COMMUNITY): Payer: Self-pay

## 2011-07-18 ENCOUNTER — Encounter (HOSPITAL_COMMUNITY)
Admission: RE | Admit: 2011-07-18 | Discharge: 2011-07-18 | Disposition: A | Discharge status: Home / Self Care | Payer: Medicare Other | Source: Ambulatory Visit | Attending: General Surgery | Admitting: General Surgery

## 2011-07-18 LAB — SURGICAL PCR SCREEN
MRSA, PCR: NEGATIVE
Staphylococcus aureus: NEGATIVE

## 2011-07-18 LAB — DIFFERENTIAL
Lymphocytes Relative: 36 % (ref 12–46)
Lymphs Abs: 1.8 10*3/uL (ref 0.7–4.0)
Monocytes Relative: 9 % (ref 3–12)
Neutrophils Relative %: 53 % (ref 43–77)

## 2011-07-18 LAB — BASIC METABOLIC PANEL
CO2: 30 mEq/L (ref 19–32)
Calcium: 10.2 mg/dL (ref 8.4–10.5)
GFR calc non Af Amer: 79 mL/min — ABNORMAL LOW (ref >90)
Potassium: 3.5 mEq/L (ref 3.5–5.1)
Sodium: 138 mEq/L (ref 135–145)

## 2011-07-18 LAB — CBC
Hemoglobin: 14.3 g/dL (ref 12.0–15.0)
Platelets: 294 10*3/uL (ref 150–400)
RBC: 5.36 MIL/uL — ABNORMAL HIGH (ref 3.87–5.11)
WBC: 5.1 10*3/uL (ref 4.0–10.5)

## 2011-07-18 MED ORDER — SODIUM CHLORIDE 0.9 % IV SOLN: 1.0000 g | INTRAVENOUS | Status: DC

## 2011-07-18 MED ORDER — ENOXAPARIN SODIUM 40 MG/0.4ML ~~LOC~~ SOLN: 40.0000 mg | Freq: Once | SUBCUTANEOUS | Status: DC

## 2011-07-18 NOTE — Patient Instructions (Addendum)
22 Jill English  07/18/2011   Your procedure is scheduled on:  07/21/11  Report to [facility] at 07:30 AM.  Call this number if you have problems the morning of surgery: [phone]   Remember:   Do not eat food:After Midnight.  May have clear liquids:until Midnight .  Clear liquids include soda, tea, black coffee, apple or grape juice, broth.  Take these medicines the morning of surgery with A SIP OF WATER: Cetirizine and Hydrochlorothiazide. Also, use your inhaler, Flonase.   Do not wear jewelry, make-up or nail polish.  Do not wear lotions, powders, or perfumes. You may wear deodorant.  Do not shave 48 hours prior to surgery.  Do not bring valuables to the hospital.  Contacts, dentures or bridgework may not be worn into surgery.  Leave suitcase in the car. After surgery it may be brought to your room.  For patients admitted to the hospital, checkout time is 11:00 AM the day of discharge.   Patients discharged the day of surgery will not be allowed to drive home.  Name and phone number of your driver:   Special Instructions: CHG Shower Use Special Wash: 1/2 bottle night before surgery and 1/2 bottle morning of surgery.   Please read over the following fact sheets that you were given: Pain Booklet, MRSA Information, Surgical Site Infection Prevention, Anesthesia Post-op Instructions and Care and Recovery After Surgery    Hernia, Surgical Repair A hernia occurs when an internal organ pushes out through a weak spot in the belly (abdominal) wall muscles. Hernias commonly occur in the groin and around the navel. Hernias often can be pushed back into place (reduced). Most hernias tend to get worse over time. Problems occur when abdominal contents get stuck in the opening (incarcerated hernia). The blood supply gets cut off (strangulated hernia). This is an emergency and needs surgery. Otherwise, hernia repair can be an elective procedure. This means you can schedule this at your  convenience when an emergency is not present. Because complications can occur, if you decide to repair the hernia, it is best to do it soon. When it becomes an emergency procedure, there is increased risk of complications after surgery. CAUSES   Heavy lifting.   Obesity.   Prolonged coughing.   Straining to move your bowels.   Hernias can also occur through a cut (incision) by a surgeonafter an abdominal operation.  HOME CARE INSTRUCTIONS Before the repair:  Bed rest is not required. You may continue your normal activities, but avoid heavy lifting (more than 10 pounds) or straining. Cough gently. If you are a smoker, it is best to stop. Even the best hernia repair can break down with the continual strain of coughing.   Do not wear anything tight over your hernia. Do not try to keep it in with an outside bandage or truss. These can damage abdominal contents if they are trapped in the hernia sac.   Eat a normal diet. Avoid constipation. Straining over long periods of time to have a bowel movement will increase hernia size. It also can breakdown repairs. If you cannot do this with diet alone, laxatives or stool softeners may be used.  PRIOR TO SURGERY, SEEK IMMEDIATE MEDICAL CARE IF: You have problems (symptoms) of a trapped (incarcerated) hernia. Symptoms include:  An oral temperature above 102 F (38.9 C) develops, or as your caregiver suggests.   Increasing abdominal pain.   Feeling sick to your stomach(nausea) and vomiting.   You stop passing gas  or stool.   The hernia is stuck outside the abdomen, looks discolored, feels hard, or is tender.   You have any changes in your bowel habits or in the hernia that is unusual for you.  LET YOUR CAREGIVERS KNOW ABOUT THE FOLLOWING:  Allergies.   Medications taken including herbs, eye drops, over the counter medications, and creams.   Use of steroids (by mouth or creams).   Family or personal history of problems with anesthetics or  Novocaine.   Possibility of pregnancy, if this applies.   Personal history of blood clots (thrombophlebitis).   Family or personal history of bleeding or blood problems.   Previous surgery.   Other health problems.  BEFORE THE PROCEDURE You should be present 1 hour prior to your procedure, or as directed by your caregiver.  AFTER THE PROCEDURE After surgery, you will be taken to the recovery area. A nurse will watch and check your progress there. Once you are awake, stable, and taking fluids well, you will be allowed to go home as long as there are no problems. Once home, an ice pack (wrapped in a light towel) applied to your operative site may help with discomfort. It may also keep the swelling down. Do not lift anything heavier than 10 pounds (4.55 kilograms). Take showers not baths. Do not drive while taking narcotics. Follow instructions as suggested by your caregiver.  SEEK IMMEDIATE MEDICAL CARE IF: After surgery:  There is redness, swelling, or increasing pain in the wound.   There is pus coming from the wound.   There is drainage from a wound lasting longer than 1 day.   An unexplained oral temperature above 102 F (38.9 C) develops.   You notice a foul smell coming from the wound or dressing.   There is a breaking open of a wound (edged not staying together) after the sutures have been removed.   You notice increasing pain in the shoulders (shoulder strap areas).   You develop dizzy episodes or fainting while standing.   You develop persistent nausea or vomiting.   You develop a rash.   You have difficulty breathing.   You develop any reaction or side effects to medications given.  MAKE SURE YOU:   Understand these instructions.   Will watch your condition.   Will get help right away if you are not doing well or get worse.  Document Released: 11/05/2000 Document Revised: 01/22/2011 Document Reviewed: 09/28/2007 [facility] Patient Information 2012 [place],  [place].   PATIENT INSTRUCTIONS POST-ANESTHESIA  IMMEDIATELY FOLLOWING SURGERY:  Do not drive or operate machinery for the first twenty four hours after surgery.  Do not make any important decisions for twenty four hours after surgery or while taking narcotic pain medications or sedatives.  If you develop intractable nausea and vomiting or a severe headache please notify your doctor immediately.  FOLLOW-UP:  Please make an appointment with your surgeon as instructed. You do not need to follow up with anesthesia unless specifically instructed to do so.  WOUND CARE INSTRUCTIONS (if applicable):  Keep a dry clean dressing on the anesthesia/puncture wound site if there is drainage.  Once the wound has quit draining you may leave it open to air.  Generally you should leave the bandage intact for twenty four hours unless there is drainage.  If the epidural site drains for more than 36-48 hours please call the anesthesia department.  QUESTIONS?:  Please feel free to call your physician or the hospital operator if you have any  questions, and they will be happy to assist you.     [facility] [address] [place] [phone]

## 2011-07-21 ENCOUNTER — Other Ambulatory Visit: Payer: Self-pay | Admitting: General Surgery

## 2011-07-21 ENCOUNTER — Ambulatory Visit (HOSPITAL_COMMUNITY)
Admission: RE | Admit: 2011-07-21 | Discharge: 2011-07-21 | Disposition: A | Discharge status: Home / Self Care | Payer: Medicare Other | Source: Ambulatory Visit | Attending: General Surgery | Admitting: General Surgery

## 2011-07-21 ENCOUNTER — Ambulatory Visit (HOSPITAL_COMMUNITY): Payer: Medicare Other | Admitting: Anesthesiology

## 2011-07-21 ENCOUNTER — Encounter (HOSPITAL_COMMUNITY): Payer: Self-pay

## 2011-07-21 ENCOUNTER — Encounter (HOSPITAL_COMMUNITY)
Admission: RE | Disposition: A | Discharge status: Home / Self Care | Payer: Self-pay | Source: Ambulatory Visit | Attending: General Surgery

## 2011-07-21 ENCOUNTER — Encounter (HOSPITAL_COMMUNITY): Payer: Self-pay | Admitting: Anesthesiology

## 2011-07-21 DIAGNOSIS — K648 Other hemorrhoids: Secondary | ICD-10-CM | POA: Insufficient documentation

## 2011-07-21 DIAGNOSIS — K644 Residual hemorrhoidal skin tags: Secondary | ICD-10-CM | POA: Insufficient documentation

## 2011-07-21 DIAGNOSIS — I10 Essential (primary) hypertension: Secondary | ICD-10-CM | POA: Insufficient documentation

## 2011-07-21 DIAGNOSIS — Z79899 Other long term (current) drug therapy: Secondary | ICD-10-CM | POA: Insufficient documentation

## 2011-07-21 HISTORY — PX: HEMORRHOID SURGERY: SHX153

## 2011-07-21 SURGERY — HEMORRHOIDECTOMY
Anesthesia: General | Wound class: Clean Contaminated

## 2011-07-21 MED ORDER — MIDAZOLAM HCL 2 MG/2ML IJ SOLN
1.0000 mg | INTRAMUSCULAR | Status: DC | PRN
  Administered 2011-07-21 (×2): 2 mg via INTRAVENOUS

## 2011-07-21 MED ORDER — PROPOFOL 10 MG/ML IV EMUL
INTRAVENOUS | Status: AC
  Filled 2011-07-21: qty 20

## 2011-07-21 MED ORDER — MIDAZOLAM HCL 2 MG/2ML IJ SOLN
INTRAMUSCULAR | Status: AC
  Filled 2011-07-21: qty 2

## 2011-07-21 MED ORDER — GLYCOPYRROLATE 0.2 MG/ML IJ SOLN
INTRAMUSCULAR | Status: AC
  Filled 2011-07-21: qty 1

## 2011-07-21 MED ORDER — ONDANSETRON HCL 4 MG/2ML IJ SOLN: 4.0000 mg | Freq: Once | INTRAMUSCULAR | Status: DC | PRN

## 2011-07-21 MED ORDER — LACTATED RINGERS IV SOLN
INTRAVENOUS | Status: DC
  Administered 2011-07-21: 08:00:00 via INTRAVENOUS

## 2011-07-21 MED ORDER — NEOSTIGMINE METHYLSULFATE 1 MG/ML IJ SOLN
INTRAMUSCULAR | Status: DC | PRN
  Administered 2011-07-21: 3 mg via INTRAVENOUS

## 2011-07-21 MED ORDER — LIDOCAINE HCL 1 % IJ SOLN
INTRAMUSCULAR | Status: DC | PRN
  Administered 2011-07-21: 25 mg via INTRADERMAL

## 2011-07-21 MED ORDER — GLYCOPYRROLATE 0.2 MG/ML IJ SOLN
INTRAMUSCULAR | Status: DC | PRN
  Administered 2011-07-21: .4 mg via INTRAVENOUS

## 2011-07-21 MED ORDER — FENTANYL CITRATE 0.05 MG/ML IJ SOLN
INTRAMUSCULAR | Status: AC
  Administered 2011-07-21: 50 ug via INTRAVENOUS
  Filled 2011-07-21: qty 2

## 2011-07-21 MED ORDER — BUPIVACAINE HCL (PF) 0.5 % IJ SOLN
INTRAMUSCULAR | Status: AC
  Filled 2011-07-21: qty 30

## 2011-07-21 MED ORDER — SODIUM CHLORIDE 0.9 % IV SOLN
INTRAVENOUS | Status: AC
  Filled 2011-07-21: qty 1

## 2011-07-21 MED ORDER — BUPIVACAINE HCL (PF) 0.5 % IJ SOLN
INTRAMUSCULAR | Status: DC | PRN
  Administered 2011-07-21: 7 mL

## 2011-07-21 MED ORDER — ROCURONIUM BROMIDE 50 MG/5ML IV SOLN
INTRAVENOUS | Status: AC
  Filled 2011-07-21: qty 1

## 2011-07-21 MED ORDER — FENTANYL CITRATE 0.05 MG/ML IJ SOLN
25.0000 ug | INTRAMUSCULAR | Status: DC | PRN
  Administered 2011-07-21 (×2): 50 ug via INTRAVENOUS

## 2011-07-21 MED ORDER — SODIUM CHLORIDE 0.9 % IR SOLN
Status: DC | PRN
  Administered 2011-07-21: 1

## 2011-07-21 MED ORDER — PROPOFOL 10 MG/ML IV BOLUS
INTRAVENOUS | Status: DC | PRN
  Administered 2011-07-21: 150 mg via INTRAVENOUS

## 2011-07-21 MED ORDER — KETOROLAC TROMETHAMINE 30 MG/ML IJ SOLN
30.0000 mg | Freq: Once | INTRAMUSCULAR | Status: AC
  Administered 2011-07-21: 30 mg via INTRAVENOUS

## 2011-07-21 MED ORDER — LIDOCAINE VISCOUS 2 % MT SOLN
OROMUCOSAL | Status: DC | PRN
  Administered 2011-07-21: 15 mL

## 2011-07-21 MED ORDER — ENOXAPARIN SODIUM 40 MG/0.4ML ~~LOC~~ SOLN
40.0000 mg | Freq: Once | SUBCUTANEOUS | Status: AC
  Administered 2011-07-21: 40 mg via SUBCUTANEOUS

## 2011-07-21 MED ORDER — ONDANSETRON HCL 4 MG/2ML IJ SOLN
4.0000 mg | Freq: Once | INTRAMUSCULAR | Status: AC
  Administered 2011-07-21: 4 mg via INTRAVENOUS

## 2011-07-21 MED ORDER — MIDAZOLAM HCL 2 MG/2ML IJ SOLN
INTRAMUSCULAR | Status: AC
  Administered 2011-07-21: 2 mg via INTRAVENOUS
  Filled 2011-07-21: qty 2

## 2011-07-21 MED ORDER — KETOROLAC TROMETHAMINE 30 MG/ML IJ SOLN
INTRAMUSCULAR | Status: AC
  Filled 2011-07-21: qty 1

## 2011-07-21 MED ORDER — OXYCODONE-ACETAMINOPHEN 7.5-325 MG PO TABS: 1.0000 | ORAL_TABLET | ORAL | Status: AC | PRN

## 2011-07-21 MED ORDER — ENOXAPARIN SODIUM 40 MG/0.4ML ~~LOC~~ SOLN
SUBCUTANEOUS | Status: AC
  Administered 2011-07-21: 40 mg via SUBCUTANEOUS
  Filled 2011-07-21: qty 0.4

## 2011-07-21 MED ORDER — SODIUM CHLORIDE 0.9 % IV SOLN: 1.0000 g | INTRAVENOUS | Status: DC

## 2011-07-21 MED ORDER — NEOSTIGMINE METHYLSULFATE 1 MG/ML IJ SOLN
INTRAMUSCULAR | Status: AC
  Filled 2011-07-21: qty 10

## 2011-07-21 MED ORDER — FENTANYL CITRATE 0.05 MG/ML IJ SOLN
INTRAMUSCULAR | Status: DC | PRN
  Administered 2011-07-21 (×2): 50 ug via INTRAVENOUS

## 2011-07-21 MED ORDER — LIDOCAINE VISCOUS 2 % MT SOLN
OROMUCOSAL | Status: AC
  Filled 2011-07-21: qty 15

## 2011-07-21 MED ORDER — SODIUM CHLORIDE 0.9 % IV SOLN
1.0000 g | INTRAVENOUS | Status: DC | PRN
  Administered 2011-07-21: 1 g via INTRAVENOUS

## 2011-07-21 MED ORDER — MIDAZOLAM HCL 5 MG/5ML IJ SOLN
INTRAMUSCULAR | Status: DC | PRN
  Administered 2011-07-21: 2 mg via INTRAVENOUS

## 2011-07-21 MED ORDER — ONDANSETRON HCL 4 MG/2ML IJ SOLN
INTRAMUSCULAR | Status: AC
  Administered 2011-07-21: 4 mg via INTRAVENOUS
  Filled 2011-07-21: qty 2

## 2011-07-21 MED ORDER — ROCURONIUM BROMIDE 100 MG/10ML IV SOLN
INTRAVENOUS | Status: DC | PRN
  Administered 2011-07-21: 25 mg via INTRAVENOUS

## 2011-07-21 SURGICAL SUPPLY — 29 items
BAG HAMPER (MISCELLANEOUS) ×2 IMPLANT
CLOTH BEACON ORANGE TIMEOUT ST (SAFETY) ×2 IMPLANT
COVER LIGHT HANDLE STERIS (MISCELLANEOUS) ×4 IMPLANT
DECANTER SPIKE VIAL GLASS SM (MISCELLANEOUS) ×2 IMPLANT
DRAPE PROXIMA HALF (DRAPES) ×2 IMPLANT
ELECT REM PT RETURN 9FT ADLT (ELECTROSURGICAL) ×2
ELECTRODE REM PT RTRN 9FT ADLT (ELECTROSURGICAL) ×1 IMPLANT
FORMALIN 10 PREFIL 120ML (MISCELLANEOUS) ×2 IMPLANT
GLOVE BIO SURGEON STRL SZ7.5 (GLOVE) ×2 IMPLANT
GLOVE BIOGEL PI IND STRL 7.0 (GLOVE) IMPLANT
GLOVE BIOGEL PI INDICATOR 7.0 (GLOVE) ×1
GLOVE ECLIPSE 6.5 STRL STRAW (GLOVE) ×1 IMPLANT
GLOVE EXAM NITRILE MD LF STRL (GLOVE) ×1 IMPLANT
GOWN STRL REIN XL XLG (GOWN DISPOSABLE) ×6 IMPLANT
HEMOSTAT SURGICEL 4X8 (HEMOSTASIS) ×2 IMPLANT
KIT ROOM TURNOVER AP CYSTO (KITS) ×2 IMPLANT
LIGASURE IMPACT 36 18CM CVD LR (INSTRUMENTS) ×1 IMPLANT
MANIFOLD NEPTUNE II (INSTRUMENTS) ×2 IMPLANT
NDL HYPO 25X1 1.5 SAFETY (NEEDLE) ×1 IMPLANT
NEEDLE HYPO 25X1 1.5 SAFETY (NEEDLE) ×2 IMPLANT
NS IRRIG 1000ML POUR BTL (IV SOLUTION) ×2 IMPLANT
PACK PERI GYN (CUSTOM PROCEDURE TRAY) ×2 IMPLANT
PAD ARMBOARD 7.5X6 YLW CONV (MISCELLANEOUS) ×2 IMPLANT
SET BASIN LINEN APH (SET/KITS/TRAYS/PACK) ×2 IMPLANT
SHEARS HARMONIC 9CM CVD (BLADE) ×1 IMPLANT
SPONGE GAUZE 4X4 12PLY (GAUZE/BANDAGES/DRESSINGS) ×2 IMPLANT
SUT SILK 0 FSL (SUTURE) ×2 IMPLANT
SUT VIC AB 2-0 CT2 27 (SUTURE) ×1 IMPLANT
SYR CONTROL 10ML LL (SYRINGE) ×2 IMPLANT

## 2011-07-21 NOTE — Discharge Instructions (Signed)
Hemorrhoidectomy Care After Hemorrhoidectomy is the removal of enlarged (dilated) veins around the rectum. Until the surgical areas are healed, control of pain and avoiding constipation are the greatest challenges for patients.  For as long as 24 hours after receiving an anesthetic (the medication that made you sleep), and while taking narcotic pain relievers, you may feel dizzy, weak and drowsy. For that reason, the following information applies to the first 24-hour period following surgery, and continues for as long as you are taking narcotic pain medications.  Do not drive a car, ride a bicycle, participate in activities in which you could be hurt. Do not take public transportation until you are off narcotic pain medications and until your caregiver says it is okay.   Do not drink alcohol, take tranquilizers, or medications not prescribed or allowed by your surgical caregiver.   Do not sign important papers or contracts for at least 24 hours or while taking narcotic medications.   Have a responsible person with you for 24 hours.  RISKS AND COMPLICATIONS Some problems that may occur following this procedure include:  Infection. A germ starts growing in the tissue surrounding the site operated on. This can usually be treated with antibiotics.   Damage to the rectal sphincter could occur. This is the muscle that opens in your anus to allow a bowel movement. This could cause incontinence. This is uncommon.   Bleeding following surgery can be a complication of almost any surgery. Your surgeon takes every precaution to keep this from happening.   Complications of anesthesia.  HOME CARE INSTRUCTIONS  Avoid straining when having bowel movements.   Avoid heavy lifting (more than 10 pounds (4.5 kilograms)).   Only take over-the-counter or prescription medicines for pain, discomfort, or fever as directed by your caregiver.   Take hot sitz baths for 20 to 30 minutes, 3 to 4 times per day.   To  keep swelling down, apply an ice pack for twenty minutes three to four times per day between sitz baths. Use a towel between your skin and the ice pack. Do not do this if it causes too much discomfort.   Keep anal area clean and dry. Following a bowel movement, you can gently wash the area with tucks (available for purchase at a drugstore) or cotton swabs. Gently pat the area dry. Do not rub the area.   Eat a well balanced diet and drink 6 to 8 glasses of water every day to avoid constipation. A bulk laxative may be also be helpful.  SEEK MEDICAL CARE IF:   You have increasing pain or tenderness near or in the surgical site.   You are unable to eat or drink.   You develop nausea or vomiting.   You develop uncontrolled bleeding such as soaking two to three pads in one hour.   You have constipation, not helped by changing your diet or increasing your fluid intake. Pain medications are a common cause of constipation.   You have pain and redness (inflammation) extending outside the area of your surgery.   You develop an unexplained oral temperature above 102 F (38.9 C), or any other signs of infection.   You have any other questions or concerns following surgery.  Document Released: 08/02/2003 Document Revised: 01/22/2011 Document Reviewed: 10/30/2008 [facility] Patient Information 2012 [place], [place].Hemorrhoidectomy Care After Hemorrhoidectomy is the removal of enlarged (dilated) veins around the rectum. Until the surgical areas are healed, control of pain and avoiding constipation are the greatest challenges for patients.  For as long as 24 hours after receiving an anesthetic (the medication that made you sleep), and while taking narcotic pain relievers, you may feel dizzy, weak and drowsy. For that reason, the following information applies to the first 24-hour period following surgery, and continues for as long as you are taking narcotic pain medications.  Do not drive a car, ride a  bicycle, participate in activities in which you could be hurt. Do not take public transportation until you are off narcotic pain medications and until your caregiver says it is okay.   Do not drink alcohol, take tranquilizers, or medications not prescribed or allowed by your surgical caregiver.   Do not sign important papers or contracts for at least 24 hours or while taking narcotic medications.   Have a responsible person with you for 24 hours.  RISKS AND COMPLICATIONS Some problems that may occur following this procedure include:  Infection. A germ starts growing in the tissue surrounding the site operated on. This can usually be treated with antibiotics.   Damage to the rectal sphincter could occur. This is the muscle that opens in your anus to allow a bowel movement. This could cause incontinence. This is uncommon.   Bleeding following surgery can be a complication of almost any surgery. Your surgeon takes every precaution to keep this from happening.   Complications of anesthesia.  HOME CARE INSTRUCTIONS  Avoid straining when having bowel movements.   Avoid heavy lifting (more than 10 pounds (4.5 kilograms)).   Only take over-the-counter or prescription medicines for pain, discomfort, or fever as directed by your caregiver.   Take hot sitz baths for 20 to 30 minutes, 3 to 4 times per day.   To keep swelling down, apply an ice pack for twenty minutes three to four times per day between sitz baths. Use a towel between your skin and the ice pack. Do not do this if it causes too much discomfort.   Keep anal area clean and dry. Following a bowel movement, you can gently wash the area with tucks (available for purchase at a drugstore) or cotton swabs. Gently pat the area dry. Do not rub the area.   Eat a well balanced diet and drink 6 to 8 glasses of water every day to avoid constipation. A bulk laxative may be also be helpful.  SEEK MEDICAL CARE IF:   You have increasing pain or  tenderness near or in the surgical site.   You are unable to eat or drink.   You develop nausea or vomiting.   You develop uncontrolled bleeding such as soaking two to three pads in one hour.   You have constipation, not helped by changing your diet or increasing your fluid intake. Pain medications are a common cause of constipation.   You have pain and redness (inflammation) extending outside the area of your surgery.   You develop an unexplained oral temperature above 102 F (38.9 C), or any other signs of infection.   You have any other questions or concerns following surgery.  Document Released: 08/02/2003 Document Revised: 01/22/2011 Document Reviewed: 10/30/2008 [facility] Patient Information 2012 [place], [place].

## 2011-07-21 NOTE — Transfer of Care (Signed)
Immediate Anesthesia Transfer of Care Note  Patient: Jill English  Procedure(s) Performed: Procedure(s) (LRB): HEMORRHOIDECTOMY (N/A)  Patient Location: PACU  Anesthesia Type: General  Level of Consciousness: awake and patient cooperative  Airway & Oxygen Therapy: Patient Spontanous Breathing and Patient connected to face mask oxygen  Post-op Assessment: Report given to PACU RN, Post -op Vital signs reviewed and stable and Patient moving all extremities  Post vital signs: Reviewed and stable  Complications: No apparent anesthesia complications

## 2011-07-21 NOTE — Progress Notes (Signed)
Rectal packing d/c'd. No rectal drainage. Tolerated well. 

## 2011-07-21 NOTE — Op Note (Signed)
Patient:  Jill English  DOB:  08/30/1965  MRN:  [id number]   Preop Diagnosis:  Hemorrhoids  Postop Diagnosis:  Same  Procedure:  Extensive hemorrhoidectomy  Surgeon:  Franky Macho, M.D.  Anes:  General  Indications:  Patient is a 46 year old black female who has multiple internal and external hemorrhoidal disease. The risks and benefits of the procedure including bleeding, infection, possibly recurrence of the hemorrhoids were fully explained to the patient, gave informed consent.  Procedure note:  Patient is placed in the lithotomy position after general anesthesia was administered. The perineum was prepped and draped using usual sterile technique with Betadine. Surgical site confirmation was performed.  The patient had an internal and external hemorrhoidal disease at the 2:00, 11:00, and 6 clock positions. All were moved in continuity using the Enseal device. All were sent to pathology further examination. Sphincter tone was intact at the end of the procedure. 0.5% Sensorcaine was instilled in the surrounding region. Surgicel and Viscous Xylocaine packing was placed in the rectum.  All tape and needle counts were correct the end of the procedure. The patient was transferred back to PACU in stable condition.  Complications:  None  EBL:  Minimal  Specimen:  Hemorrhoids

## 2011-07-21 NOTE — Anesthesia Preprocedure Evaluation (Signed)
Anesthesia Evaluation  Patient identified by MRN, date of birth, ID band Patient awake    Reviewed: Allergy & Precautions, H&P , NPO status , Patient's Chart, lab work & pertinent test results  History of Anesthesia Complications Negative for: history of anesthetic complications  Airway Mallampati: I TM Distance: >3 FB     Dental  (+) Edentulous Upper   Pulmonary neg pulmonary ROS,    Pulmonary exam normal       Cardiovascular hypertension, Pt. on medications Regular Normal    Neuro/Psych  Headaches (hx narcolepsy), PSYCHIATRIC DISORDERS Anxiety Hx Narcolepsy, 2 x week.  Neuromuscular disease (multiple sclerosis - R leg)    GI/Hepatic GERD-  Medicated and Controlled,  Endo/Other    Renal/GU      Musculoskeletal   Abdominal   Peds  Hematology   Anesthesia Other Findings   Reproductive/Obstetrics                           Anesthesia Physical Anesthesia Plan  ASA: III  Anesthesia Plan: General   Post-op Pain Management:    Induction: Intravenous, Rapid sequence and Cricoid pressure planned  Airway Management Planned: Oral ETT  Additional Equipment:   Intra-op Plan:   Post-operative Plan: Extubation in OR  Informed Consent: I have reviewed the patients History and Physical, chart, labs and discussed the procedure including the risks, benefits and alternatives for the proposed anesthesia with the patient or authorized representative who has indicated his/her understanding and acceptance.     Plan Discussed with:   Anesthesia Plan Comments:         Anesthesia Quick Evaluation

## 2011-07-21 NOTE — Anesthesia Procedure Notes (Signed)
Procedure Name: Intubation Date/Time: 07/21/2011 9:28 AM Performed by: Despina Hidden Pre-anesthesia Checklist: Emergency Drugs available, Suction available, Patient identified and Patient being monitored Patient Re-evaluated:Patient Re-evaluated prior to inductionOxygen Delivery Method: Circle system utilized Preoxygenation: Pre-oxygenation with 100% oxygen Ventilation: Mask ventilation without difficulty Laryngoscope Size: Mac and 3 Grade View: Grade I Tube type: Oral Tube size: 7.0 mm Number of attempts: 1 Airway Equipment and Method: Stylet Placement Confirmation: ETT inserted through vocal cords under direct vision,  breath sounds checked- equal and bilateral and positive ETCO2 Secured at: 22 cm Tube secured with: Tape Dental Injury: Teeth and Oropharynx as per pre-operative assessment

## 2011-07-21 NOTE — Anesthesia Postprocedure Evaluation (Signed)
  Anesthesia Post-op Note  Patient: Jill English  Procedure(s) Performed: Procedure(s) (LRB): HEMORRHOIDECTOMY (N/A)  Patient Location: PACU  Anesthesia Type: General  Level of Consciousness: awake, alert , oriented and patient cooperative  Airway and Oxygen Therapy: Patient Spontanous Breathing  Post-op Pain: none  Post-op Assessment: Post-op Vital signs reviewed, Patient's Cardiovascular Status Stable, Respiratory Function Stable, Patent Airway and No signs of Nausea or vomiting  Post-op Vital Signs: Reviewed and stable  Complications: No apparent anesthesia complications

## 2011-07-21 NOTE — Interval H&P Note (Signed)
History and Physical Interval Note:  07/21/2011 8:39 AM  Jill English  has presented today for surgery, with the diagnosis of Hemorrhoids   The various methods of treatment have been discussed with the patient and family. After consideration of risks, benefits and other options for treatment, the patient has consented to  Procedure(s) (LRB): HEMORRHOIDECTOMY (N/A) as a surgical intervention .  The patients' history has been reviewed, patient examined, no change in status, stable for surgery.  I have reviewed the patients' chart and labs.  Questions were answered to the patient's satisfaction.     Franky Macho A

## 2011-07-23 ENCOUNTER — Encounter (HOSPITAL_COMMUNITY): Payer: Self-pay | Admitting: General Surgery

## 2011-09-25 NOTE — Telephone Encounter (Signed)
1st no show

## 2011-09-25 NOTE — Telephone Encounter (Signed)
2nd no show. Appears had hemorrhoidectomy with Dr. Lovell Sheehan. May offer letter to contact us for appt.

## 2011-10-02 ENCOUNTER — Encounter: Payer: Self-pay | Admitting: Gastroenterology

## 2011-10-02 NOTE — Telephone Encounter (Signed)
Mailed letter for patient to call office to set up OV °

## 2012-05-10 ENCOUNTER — Encounter: Payer: Self-pay | Admitting: Gastroenterology

## 2012-05-11 ENCOUNTER — Encounter: Payer: Self-pay | Admitting: Gastroenterology

## 2012-05-11 ENCOUNTER — Telehealth (HOSPITAL_COMMUNITY): Payer: Self-pay | Admitting: Dietician

## 2012-05-11 ENCOUNTER — Ambulatory Visit (INDEPENDENT_AMBULATORY_CARE_PROVIDER_SITE_OTHER): Payer: Medicare Other | Admitting: Gastroenterology

## 2012-05-11 VITALS — BP 132/85 | HR 71 | Temp 98.4°F | Ht 63.0 in | Wt 198.8 lb

## 2012-05-11 DIAGNOSIS — K5900 Constipation, unspecified: Secondary | ICD-10-CM

## 2012-05-11 DIAGNOSIS — R1013 Epigastric pain: Secondary | ICD-10-CM | POA: Insufficient documentation

## 2012-05-11 LAB — HEPATIC FUNCTION PANEL
Bilirubin, Direct: 0.1 mg/dL (ref 0.0–0.3)
Indirect Bilirubin: 0.2 mg/dL (ref 0.0–0.9)
Total Protein: 6.3 g/dL (ref 6.0–8.3)

## 2012-05-11 MED ORDER — PANTOPRAZOLE SODIUM 40 MG PO TBEC: 40.0000 mg | DELAYED_RELEASE_TABLET | Freq: Every day | ORAL | Status: AC

## 2012-05-11 MED ORDER — LUBIPROSTONE 24 MCG PO CAPS: 24.0000 ug | ORAL_CAPSULE | Freq: Two times a day (BID) | ORAL | Status: DC

## 2012-05-11 NOTE — Patient Instructions (Addendum)
Start taking Protonix daily, 30 minutes before the first meal of the day. This is for reflux and upper abdominal discomfort. Continue to avoid Ibuprofen, Aleve, Motrin, Advil, etc. Only stick with tylenol products.   Call me in about 10-14 days to give an update on how you are doing. If you continue to have symptoms, we will schedule and ultrasound to assess for any gallbladder issues.   Start taking Amitiza WITH FOOD twice a day. This is for constipation. We have provided samples and sent a prescription.   Please have blood work completed. We will call with the results. Please call us if any problems in the meantime. We will see you back in the office in 6 months. We can set up the ultrasound and other things without having you come back in, if you don't improve with this medication.  Have a good Christmas!

## 2012-05-11 NOTE — Telephone Encounter (Signed)
Received referral via fax from [facility] for dx: epigastric pain, diverticulitis, low fat diet.

## 2012-05-11 NOTE — Assessment & Plan Note (Addendum)
Continued upper abdominal pain, worsened with fatty and fried foods. +gastritis on EGD Nov 2012, negative H.pylori. Wt gain noted since last visit a year ago. Not on a PPI as prescribed due to running out. Question gastritis but unable to r/o biliary process. Resume Protonix daily, contact us in 10-14 days with progress report. If continued bloating, abdominal discomfort, schedule [place] +/- HIDA to assess for biliary dyskinesia. [place] in remote past without evidence of stones. Return in 6 months otherwise.   Protonix HFP [place] possibly in future (+/- HIDA)

## 2012-05-11 NOTE — Progress Notes (Signed)
Faxed to PCP

## 2012-05-11 NOTE — Assessment & Plan Note (Signed)
Needs more aggressive bowel regimen. BM once per week if no intervention. TCS on file from Nov 2012. Start Amitiza 24 mcg BID. Return in 6 mos or sooner if necessary.

## 2012-05-11 NOTE — Progress Notes (Signed)
Referring Provider: Isac Sarna, MD Primary Care Physician:  Isac Sarna, MD Primary Gastroenterologist: Dr. Darrick Penna   Chief Complaint  Patient presents with  . Abdominal Pain    HPI:   46 year old female who presents today in follow-up, hx of NSAID-induced gastritis with EGD in Nov 2012. TCS at this time as well with diverticulosis. Has had hemorrhoid repair in interim from last visit. Notes upper abdominal soreness, LLQ discomfort wrapping around back "for awhile". Epigastric discomfort worse with "certain foods". Notes greasy and fatty foods. LLQ discomfort worse with constipation. Notes bitterness when burping. +bloating, sometimes nausea intermittently. Ran out of probiotics and PPI. Was on Protonix. Needs more, "hasn't taken in so long". Takes a laxative sometimes for constipation. +chronic constipation. UP 6 lbs from Oct 2012. Takes Ibuprofen as needed. Cut back a lot, states "messes up my stomach".    Past Medical History  Diagnosis Date  . MS (multiple sclerosis)   . Diverticulitis Sept 2011    CT documented  . Hypertension   . Hyperlipidemia   . Narcolepsy   . GERD (gastroesophageal reflux disease)   . Anxiety   . Hemorrhoids     Past Surgical History  Procedure Date  . Partial hysterectomy 2008    with removal of rt ovary  . Carpal tunnel repair 2009  . Tubal ligation 1992  . Back surgery 2004  . Esophagogastroduodenoscopy 04/07/11    SLF: no evidence of h pylori/gastric body and antral mucosa with minimal chronic inflammation, negative H.pylori  . Colonoscopy 04/07/11    [id number] internal hemorrhoids/diverticula in the sigmoid colon  . Hemorrhoid surgery 07/21/2011    Procedure: HEMORRHOIDECTOMY;  Surgeon: Dalia Heading, MD;  Location: [facility];  Service: General;  Laterality: N/A;    Current Outpatient Prescriptions  Medication Sig Dispense Refill  . cetirizine (ZYRTEC) 10 MG tablet Take 10 mg by mouth daily. As needed      . desonide (DESOWEN) 0.05 %  lotion Apply 1 application topically Twice daily as needed. Apply to scalp if a flare-up of alopecia      . FLUOCINOLONE ACETONIDE SCALP 0.01 % external oil Apply 1 application topically 2 (two) times daily as needed. Using to control alopecia and also for flare-ups      . fluticasone (FLONASE) 50 MCG/ACT nasal spray Place 2 sprays into the nose daily as needed. Sinuses      . hydrochlorothiazide (HYDRODIURIL) 25 MG tablet Take 25 mg by mouth daily.        Marland Kitchen ibuprofen (ADVIL,MOTRIN) 800 MG tablet Take 800 mg by mouth every 8 (eight) hours as needed. For pain        . interferon beta-1a (REBIF) 44 MCG/0.5ML injection Inject 44 mcg into the skin 3 (three) times a week. Take on mondays,wednesdays and fridays       . HAWTHORN PO Take 1 tablet by mouth daily.      . Omega-3 Fatty Acids (FISH OIL PO) Take 2 capsules by mouth at bedtime.      Marland Kitchen OVER THE COUNTER MEDICATION Take 2 tablets by mouth 2 (two) times daily. G.I. Revive      . oxyCODONE-acetaminophen (PERCOCET) 7.5-325 MG per tablet Take 1-2 tablets by mouth every 4 (four) hours as needed for pain.  50 tablet  0  . simvastatin (ZOCOR) 40 MG tablet Take 40 mg by mouth every evening.          Allergies as of 05/11/2012  . (No Known Allergies)    Family History  Problem Relation Age of Onset  . Colon cancer Neg Hx   . Anesthesia problems Neg Hx   . Hypotension Neg Hx   . Malignant hyperthermia Neg Hx   . Pseudochol deficiency Neg Hx     History   Social History  . Marital Status: Married    Spouse Name: N/A    Number of Children: N/A  . Years of Education: N/A   Social History Main Topics  . Smoking status: Never Smoker   . Smokeless tobacco: None  . Alcohol Use: No  . Drug Use: No  . Sexually Active: None   Other Topics Concern  . None   Social History Narrative  . None    Review of Systems: Negative unless mentioned above  Physical Exam: BP 132/85  Pulse 71  Temp 98.4 F (36.9 C) (Oral)  Ht 5\' 3"  (1.6 m)  Wt  198 lb 12.8 oz (90.175 kg)  BMI 35.22 kg/m2 General:   Alert and oriented. No distress noted. Pleasant and cooperative.  Head:  Normocephalic and atraumatic. Eyes:  Conjuctiva clear without scleral icterus. Mouth:  Oral mucosa pink and moist. Good dentition. No lesions. Heart:  S1, S2 present without murmurs, rubs, or gallops. Regular rate and rhythm. Abdomen:  +BS, soft, non-tender and non-distended. No rebound or guarding. No HSM or masses noted. Msk:  Symmetrical without gross deformities. Normal posture. Extremities:  Without edema. Neurologic:  Alert and  oriented x4;  grossly normal neurologically. Skin:  Intact without significant lesions or rashes. Psych:  Alert and cooperative. Normal mood and affect.

## 2012-05-24 NOTE — Telephone Encounter (Signed)
Pt did not respond to first contact attempt. Sent letter to pt home via [place] Mail in attempt to contact pt to schedule appointment.

## 2012-05-27 NOTE — Progress Notes (Signed)
Quick Note:  Normal LFTs. How is pt doing since resuming PPI? If continued issues, we will consider US+/- HIDA ______

## 2012-05-31 NOTE — Telephone Encounter (Signed)
Final attempt. Pt has not responded to previous contact attempts. Sent letter to pt home via [place] Mail in attempt to contact pt to schedule appointment.

## 2012-06-01 NOTE — Progress Notes (Signed)
Quick Note:  LMOM to call. ______ 

## 2012-06-02 NOTE — Progress Notes (Signed)
Quick Note:  Yes, let's schedule [place] of abdomen.  Evaluate for gallstones. May need HIDA. ______

## 2012-06-03 ENCOUNTER — Other Ambulatory Visit: Payer: Self-pay | Admitting: Gastroenterology

## 2012-06-03 DIAGNOSIS — Z711 Person with feared health complaint in whom no diagnosis is made: Secondary | ICD-10-CM

## 2012-06-03 DIAGNOSIS — R1011 Right upper quadrant pain: Secondary | ICD-10-CM

## 2012-06-03 NOTE — Telephone Encounter (Signed)
Pt has not responded to attempts to contact to schedule appointment. Referral filed.  

## 2012-06-03 NOTE — Progress Notes (Signed)
Patient is scheduled for Abd U/S on Wednesday 01/15 at 9:00 am and patient is aware

## 2012-06-09 ENCOUNTER — Ambulatory Visit (HOSPITAL_COMMUNITY): Payer: Medicare Other

## 2012-06-15 ENCOUNTER — Ambulatory Visit (HOSPITAL_COMMUNITY)
Admission: RE | Admit: 2012-06-15 | Discharge: 2012-06-15 | Disposition: A | Discharge status: Home / Self Care | Payer: Medicare Other | Source: Ambulatory Visit | Attending: Gastroenterology | Admitting: Gastroenterology

## 2012-06-15 ENCOUNTER — Other Ambulatory Visit: Payer: Self-pay | Admitting: Gastroenterology

## 2012-06-15 DIAGNOSIS — R1011 Right upper quadrant pain: Secondary | ICD-10-CM | POA: Insufficient documentation

## 2012-06-15 DIAGNOSIS — Z711 Person with feared health complaint in whom no diagnosis is made: Secondary | ICD-10-CM

## 2012-06-18 ENCOUNTER — Other Ambulatory Visit: Payer: Self-pay | Admitting: Neurosurgery

## 2012-06-18 DIAGNOSIS — M47812 Spondylosis without myelopathy or radiculopathy, cervical region: Secondary | ICD-10-CM

## 2012-06-20 ENCOUNTER — Encounter (HOSPITAL_COMMUNITY): Payer: Self-pay | Admitting: Emergency Medicine

## 2012-06-20 ENCOUNTER — Emergency Department (HOSPITAL_COMMUNITY)
Admission: EM | Admit: 2012-06-20 | Discharge: 2012-06-20 | Discharge status: Against Medical Advice | Payer: Medicare Other | Attending: Emergency Medicine | Admitting: Emergency Medicine

## 2012-06-20 DIAGNOSIS — L299 Pruritus, unspecified: Secondary | ICD-10-CM | POA: Insufficient documentation

## 2012-06-20 DIAGNOSIS — R21 Rash and other nonspecific skin eruption: Secondary | ICD-10-CM | POA: Insufficient documentation

## 2012-06-20 NOTE — ED Notes (Signed)
Patient left telling registration that she would come back later.

## 2012-06-20 NOTE — ED Notes (Signed)
Patient states her face has been getting rashes and itching for months; patient states she has been seeing a dermatologist, but they haven't done any allergy testing.

## 2012-06-20 NOTE — ED Notes (Signed)
Notified by Barbara Cower in registration that patient left stating she would come back later.

## 2012-06-22 ENCOUNTER — Telehealth: Payer: Self-pay

## 2012-06-22 NOTE — Telephone Encounter (Signed)
Pt called wanting to know the results of her [place].

## 2012-06-22 NOTE — Telephone Encounter (Signed)
LMOM to call back

## 2012-06-22 NOTE — Telephone Encounter (Signed)
I have sent a result note. [place] negative, needs HIDA.

## 2012-06-22 NOTE — Progress Notes (Signed)
Quick Note:  No stones, [place] of abd normal Schedule HIDA if patient is willing.  Reason: assess for biliary dyskinesia  ______

## 2012-06-23 ENCOUNTER — Other Ambulatory Visit: Payer: Self-pay | Admitting: Gastroenterology

## 2012-06-23 DIAGNOSIS — R1011 Right upper quadrant pain: Secondary | ICD-10-CM

## 2012-06-23 NOTE — Progress Notes (Signed)
Patient is scheduled for a HIDA on Feb 10th at 8 am and patient is aware

## 2012-06-23 NOTE — Telephone Encounter (Signed)
Pt aware of results 

## 2012-06-25 ENCOUNTER — Other Ambulatory Visit (HOSPITAL_COMMUNITY): Payer: Medicare Other

## 2012-06-29 ENCOUNTER — Ambulatory Visit
Admission: RE | Admit: 2012-06-29 | Discharge: 2012-06-29 | Disposition: A | Discharge status: Home / Self Care | Payer: Medicare Other | Source: Ambulatory Visit | Attending: Neurosurgery | Admitting: Neurosurgery

## 2012-06-29 DIAGNOSIS — M47812 Spondylosis without myelopathy or radiculopathy, cervical region: Secondary | ICD-10-CM

## 2012-07-05 ENCOUNTER — Encounter (HOSPITAL_COMMUNITY): Payer: Medicare Other

## 2012-07-20 ENCOUNTER — Encounter (HOSPITAL_COMMUNITY): Payer: Medicare Other

## 2012-07-30 NOTE — Progress Notes (Signed)
DEC 2013: NL HFP JAN 2014: NL U/S

## 2012-08-10 ENCOUNTER — Emergency Department (HOSPITAL_COMMUNITY)
Admission: EM | Admit: 2012-08-10 | Discharge: 2012-08-10 | Disposition: A | Discharge status: Home / Self Care | Payer: Medicare Other | Attending: Emergency Medicine | Admitting: Emergency Medicine

## 2012-08-10 ENCOUNTER — Encounter (HOSPITAL_COMMUNITY): Payer: Self-pay

## 2012-08-10 DIAGNOSIS — K219 Gastro-esophageal reflux disease without esophagitis: Secondary | ICD-10-CM | POA: Insufficient documentation

## 2012-08-10 DIAGNOSIS — E785 Hyperlipidemia, unspecified: Secondary | ICD-10-CM | POA: Insufficient documentation

## 2012-08-10 DIAGNOSIS — M545 Low back pain, unspecified: Secondary | ICD-10-CM | POA: Insufficient documentation

## 2012-08-10 DIAGNOSIS — Z90710 Acquired absence of both cervix and uterus: Secondary | ICD-10-CM | POA: Insufficient documentation

## 2012-08-10 DIAGNOSIS — Z8679 Personal history of other diseases of the circulatory system: Secondary | ICD-10-CM | POA: Insufficient documentation

## 2012-08-10 DIAGNOSIS — R350 Frequency of micturition: Secondary | ICD-10-CM | POA: Insufficient documentation

## 2012-08-10 DIAGNOSIS — M549 Dorsalgia, unspecified: Secondary | ICD-10-CM

## 2012-08-10 DIAGNOSIS — IMO0002 Reserved for concepts with insufficient information to code with codable children: Secondary | ICD-10-CM | POA: Insufficient documentation

## 2012-08-10 DIAGNOSIS — B9689 Other specified bacterial agents as the cause of diseases classified elsewhere: Secondary | ICD-10-CM

## 2012-08-10 DIAGNOSIS — Z8659 Personal history of other mental and behavioral disorders: Secondary | ICD-10-CM | POA: Insufficient documentation

## 2012-08-10 DIAGNOSIS — Z8669 Personal history of other diseases of the nervous system and sense organs: Secondary | ICD-10-CM | POA: Insufficient documentation

## 2012-08-10 DIAGNOSIS — I10 Essential (primary) hypertension: Secondary | ICD-10-CM | POA: Insufficient documentation

## 2012-08-10 DIAGNOSIS — Z79899 Other long term (current) drug therapy: Secondary | ICD-10-CM | POA: Insufficient documentation

## 2012-08-10 DIAGNOSIS — N760 Acute vaginitis: Secondary | ICD-10-CM | POA: Insufficient documentation

## 2012-08-10 DIAGNOSIS — Z9889 Other specified postprocedural states: Secondary | ICD-10-CM | POA: Insufficient documentation

## 2012-08-10 DIAGNOSIS — Z8719 Personal history of other diseases of the digestive system: Secondary | ICD-10-CM | POA: Insufficient documentation

## 2012-08-10 DIAGNOSIS — G35 Multiple sclerosis: Secondary | ICD-10-CM | POA: Insufficient documentation

## 2012-08-10 DIAGNOSIS — R300 Dysuria: Secondary | ICD-10-CM | POA: Insufficient documentation

## 2012-08-10 LAB — URINALYSIS, ROUTINE W REFLEX MICROSCOPIC
Bilirubin Urine: NEGATIVE
Glucose, UA: NEGATIVE mg/dL
Ketones, ur: NEGATIVE mg/dL
Leukocytes, UA: NEGATIVE
Protein, ur: NEGATIVE mg/dL

## 2012-08-10 LAB — WET PREP, GENITAL

## 2012-08-10 MED ORDER — METRONIDAZOLE 500 MG PO TABS: 500.0000 mg | ORAL_TABLET | Freq: Two times a day (BID) | ORAL | Status: DC

## 2012-08-10 MED ORDER — HYDROCODONE-ACETAMINOPHEN 5-325 MG PO TABS
1.0000 | ORAL_TABLET | Freq: Once | ORAL | Status: AC
  Administered 2012-08-10: 1 via ORAL
  Filled 2012-08-10: qty 1

## 2012-08-10 MED ORDER — CYCLOBENZAPRINE HCL 10 MG PO TABS
10.0000 mg | ORAL_TABLET | Freq: Once | ORAL | Status: AC
  Administered 2012-08-10: 10 mg via ORAL
  Filled 2012-08-10: qty 1

## 2012-08-10 MED ORDER — HYDROCODONE-ACETAMINOPHEN 5-325 MG PO TABS: ORAL_TABLET | ORAL | Status: DC

## 2012-08-10 MED ORDER — CYCLOBENZAPRINE HCL 10 MG PO TABS: 10.0000 mg | ORAL_TABLET | Freq: Three times a day (TID) | ORAL | Status: DC | PRN

## 2012-08-10 NOTE — ED Notes (Signed)
Pt reporting pain in lower abdomen and occasionally in lower back.  Reporting urinary frequency and mild pain with urination.  Denies nausea or vomiting at this time.

## 2012-08-10 NOTE — ED Notes (Signed)
I have been having lower back pain and urinary frequency per pt. Has been over a month since it started per pt.

## 2012-08-10 NOTE — ED Provider Notes (Signed)
History     CSN: [id number]  Arrival date & time 08/10/12  2013   First MD Initiated Contact with Patient 08/10/12 2029      Chief Complaint  Patient presents with  . Back Pain  . Urinary Frequency    (Consider location/radiation/quality/duration/timing/severity/associated sxs/prior treatment) Patient is a 47 y.o. female presenting with dysuria and back pain. The history is provided by the patient.  Dysuria  This is a new problem. The current episode started more than 1 week ago. The problem occurs intermittently. The problem has not changed since onset.The quality of the pain is described as aching and burning. The pain is mild. There has been no fever. She is sexually active (patient admits to only being sexually active with her husband). There is no history of pyelonephritis. Associated symptoms include frequency. Pertinent negatives include no chills, no nausea, no vomiting, no discharge, no hematuria, no hesitancy, no possible pregnancy, no urgency and no flank pain. She has tried nothing for the symptoms. Her past medical history does not include kidney stones or recurrent UTIs.  Back Pain Location:  Lumbar spine Quality:  Aching Radiates to:  Does not radiate Pain severity:  Mild Pain is:  Same all the time Onset quality:  Gradual Duration:  1 month Timing:  Constant Progression:  Unchanged Chronicity:  New Relieved by:  Nothing Worsened by:  Bending, standing and movement Ineffective treatments:  None tried Associated symptoms: dysuria   Associated symptoms: no abdominal pain, no abdominal swelling, no bladder incontinence, no bowel incontinence, no chest pain, no fever, no headaches, no leg pain, no numbness, no paresthesias, no pelvic pain, no perianal numbness, no tingling and no weakness     Past Medical History  Diagnosis Date  . MS (multiple sclerosis)   . Diverticulitis Sept 2011    CT documented  . Hypertension   . Hyperlipidemia   . Narcolepsy   . GERD  (gastroesophageal reflux disease)   . Anxiety   . Hemorrhoids     Past Surgical History  Procedure Laterality Date  . Partial hysterectomy  2008    with removal of rt ovary  . Carpal tunnel repair  2009  . Tubal ligation  1992  . Back surgery  2004  . Esophagogastroduodenoscopy  04/07/11    SLF: no evidence of h pylori/gastric body and antral mucosa with minimal chronic inflammation, negative H.pylori  . Colonoscopy  04/07/11    [id number] internal hemorrhoids/diverticula in the sigmoid colon  . Hemorrhoid surgery  07/21/2011    Procedure: HEMORRHOIDECTOMY;  Surgeon: Dalia Heading, MD;  Location: [facility];  Service: General;  Laterality: N/A;    Family History  Problem Relation Age of Onset  . Colon cancer Neg Hx   . Anesthesia problems Neg Hx   . Hypotension Neg Hx   . Malignant hyperthermia Neg Hx   . Pseudochol deficiency Neg Hx     History  Substance Use Topics  . Smoking status: Never Smoker   . Smokeless tobacco: Not on file  . Alcohol Use: No    OB History   Grav Para Term Preterm Abortions TAB SAB Ect Mult Living                  Review of Systems  Constitutional: Negative for fever and chills.  Respiratory: Negative for shortness of breath.   Cardiovascular: Negative for chest pain.  Gastrointestinal: Negative for nausea, vomiting, abdominal pain, abdominal distention and bowel incontinence.  Genitourinary: Positive for dysuria and  frequency. Negative for bladder incontinence, hesitancy, urgency, hematuria, flank pain, vaginal bleeding, vaginal discharge, difficulty urinating and pelvic pain.  Musculoskeletal: Positive for back pain.  Neurological: Negative for tingling, weakness, numbness, headaches and paresthesias.  All other systems reviewed and are negative.    Allergies  Review of patient's allergies indicates no known allergies.  Home Medications   Current Outpatient Rx  Name  Route  Sig  Dispense  Refill  . cetirizine (ZYRTEC) 10 MG tablet    Oral   Take 10 mg by mouth daily. As needed         . desonide (DESOWEN) 0.05 % lotion   Topical   Apply 1 application topically Twice daily as needed. Apply to scalp if a flare-up of alopecia         . FLUOCINOLONE ACETONIDE SCALP 0.01 % external oil   Topical   Apply 1 application topically 2 (two) times daily as needed. Using to control alopecia and also for flare-ups         . fluticasone (FLONASE) 50 MCG/ACT nasal spray   Nasal   Place 2 sprays into the nose daily as needed. Sinuses         . HAWTHORN PO   Oral   Take 1 tablet by mouth daily.         . hydrochlorothiazide (HYDRODIURIL) 25 MG tablet   Oral   Take 25 mg by mouth daily.           Marland Kitchen ibuprofen (ADVIL,MOTRIN) 800 MG tablet   Oral   Take 800 mg by mouth every 8 (eight) hours as needed. For pain           . interferon beta-1a (REBIF) 44 MCG/0.5ML injection   Subcutaneous   Inject 44 mcg into the skin 3 (three) times a week. Take on mondays,wednesdays and fridays          . lubiprostone (AMITIZA) 24 MCG capsule   Oral   Take 1 capsule (24 mcg total) by mouth 2 (two) times daily with a meal.   60 capsule   3   . Omega-3 Fatty Acids (FISH OIL PO)   Oral   Take 2 capsules by mouth at bedtime.         Marland Kitchen OVER THE COUNTER MEDICATION   Oral   Take 2 tablets by mouth 2 (two) times daily. G.I. Revive         . pantoprazole (PROTONIX) 40 MG tablet   Oral   Take 1 tablet (40 mg total) by mouth daily. 30 minutes before first meal of day.   30 tablet   3   . simvastatin (ZOCOR) 40 MG tablet   Oral   Take 40 mg by mouth every evening.             BP 144/92  Pulse 86  Temp(Src) 98.7 F (37.1 C) (Oral)  Resp 18  Ht 5\' 4"  (1.626 m)  Wt 186 lb (84.369 kg)  BMI 31.91 kg/m2  SpO2 100%  Physical Exam  Nursing note and vitals reviewed. Constitutional: She is oriented to person, place, and time. She appears well-developed and well-nourished. No distress.  HENT:  Head: Normocephalic  and atraumatic.  Neck: Normal range of motion. Neck supple.  Cardiovascular: Normal rate, regular rhythm and intact distal pulses.   No murmur heard. Pulmonary/Chest: Effort normal and breath sounds normal.  Abdominal: Soft. She exhibits no distension and no mass. There is no tenderness. There is no rebound and  no guarding.  Genitourinary: No tenderness or bleeding around the vagina. No foreign body around the vagina. No vaginal discharge found.  Uterus absent,  patient is s/p partial hysterectomy  Musculoskeletal: She exhibits tenderness. She exhibits no edema.       Lumbar back: She exhibits tenderness and pain. She exhibits normal range of motion, no bony tenderness, no swelling, no deformity, no laceration and normal pulse.       Back:  ttp of the right lumbar paraspinal muscles.  DP pulses are symmetrical and brisk, distal sensation intact.  Pain reproduced with SLR on right.  No calf pain or edema.  Neurological: She is alert and oriented to person, place, and time. No cranial nerve deficit or sensory deficit. She exhibits normal muscle tone. Coordination and gait normal.  Reflex Scores:      Patellar reflexes are 2+ on the right side and 2+ on the left side.      Achilles reflexes are 2+ on the right side and 2+ on the left side. Skin: Skin is warm and dry.    ED Course  Procedures (including critical care time)  Labs Reviewed  URINALYSIS, ROUTINE W REFLEX MICROSCOPIC - Abnormal; Notable for the following:    APPearance HAZY (*)    All other components within normal limits  WET PREP, GENITAL  GC/CHLAMYDIA PROBE AMP      GC and chlamydia culture pending  MDM    Patient is well appearing, ambulates with a steady gait.  No focal neuro deficits on exam.  Will treat for BV and musculoskeletal pain.  Pt agrees to f/u with her PMD if needed.  Doubt emergent neurological or infectious process.  The patient appears reasonably screened and/or stabilized for discharge and I doubt  any other medical condition or other [facility] requiring further screening, evaluation, or treatment in the ED at this time prior to discharge.       Currie Dennin L. Trisha Mangle, PA-C 08/12/12 1532

## 2012-08-11 LAB — GC/CHLAMYDIA PROBE AMP
CT Probe RNA: NEGATIVE
GC Probe RNA: NEGATIVE

## 2012-08-13 ENCOUNTER — Ambulatory Visit: Payer: Self-pay | Admitting: Neurology

## 2012-08-15 NOTE — ED Provider Notes (Signed)
Medical screening examination/treatment/procedure(s) were performed by non-physician practitioner and as supervising physician I was immediately available for consultation/collaboration.   Laray Anger, DO 08/15/12 [phone]

## 2012-11-04 ENCOUNTER — Telehealth: Payer: Self-pay | Admitting: Neurology

## 2012-11-04 NOTE — Telephone Encounter (Signed)
Has an appt set for Dec. 24.  Needs Rebif called to [facility].  Questions call her at [phone]

## 2012-11-05 MED ORDER — INTERFERON BETA-1A 44 MCG/0.5ML ~~LOC~~ SOLN: 44.0000 ug | SUBCUTANEOUS | Status: DC

## 2012-11-09 ENCOUNTER — Encounter: Payer: Self-pay | Admitting: Gastroenterology

## 2012-11-15 ENCOUNTER — Other Ambulatory Visit: Payer: Self-pay

## 2012-11-15 MED ORDER — INTERFERON BETA-1A 22 MCG/0.5ML ~~LOC~~ SOLN: 22.0000 ug | SUBCUTANEOUS | Status: DC

## 2012-11-15 MED ORDER — INTERFERON BETA-1A 44 MCG/0.5ML ~~LOC~~ SOLN: 44.0000 ug | SUBCUTANEOUS | Status: DC

## 2012-11-15 NOTE — Telephone Encounter (Signed)
[facility] sent [place] a fax asking that we resend the Rebif Rx and notate if Titration is needed.  Patient has been using this medication since 2010.  She was unable to tolerate the full dose and has been using .  No titration is needed.  I called [facility] at [phone] (opt 2).  Spoke with Leanne Chang (?-name was difficult to understand).  I explained patient does not need titration and she is unable to tolerate , so she needs to continue her dose of .  She took Rx for as a verbal via phone and said they will process the Rx request.  They will contact patient to set up delivery and will contact us if any further info is needed.

## 2013-01-17 ENCOUNTER — Telehealth: Payer: Self-pay | Admitting: Neurology

## 2013-01-18 NOTE — Telephone Encounter (Signed)
This patient is interested in getting a copy of a disability letter that was filled out in 2011. I have copied this out for her, she can come by and pick it up. The patient has not been seen since May of 2013.

## 2013-03-24 ENCOUNTER — Other Ambulatory Visit: Payer: Self-pay | Admitting: Neurology

## 2013-03-29 ENCOUNTER — Emergency Department: Payer: Self-pay | Admitting: Emergency Medicine

## 2013-03-29 LAB — COMPREHENSIVE METABOLIC PANEL
Albumin: 3.5 g/dL (ref 3.4–5.0)
Anion Gap: 1 — ABNORMAL LOW (ref 7–16)
BUN: 8 mg/dL (ref 7–18)
Chloride: 106 mmol/L (ref 98–107)
Creatinine: 0.83 mg/dL (ref 0.60–1.30)
EGFR (Non-African Amer.): >60
Glucose: 85 mg/dL (ref 65–99)
SGOT(AST): 21 U/L (ref 15–37)
Total Protein: 7.2 g/dL (ref 6.4–8.2)

## 2013-03-29 LAB — URINALYSIS, COMPLETE
Bilirubin,UR: NEGATIVE
Blood: NEGATIVE
Glucose,UR: NEGATIVE mg/dL (ref 0–75)
Nitrite: NEGATIVE
Protein: NEGATIVE
Specific Gravity: 1.024 (ref 1.003–1.030)
Squamous Epithelial: 9
WBC UR: 1 /HPF (ref 0–5)

## 2013-03-29 LAB — CBC
HGB: 14.7 g/dL (ref 12.0–16.0)
MCH: 26.6 pg (ref 26.0–34.0)
MCV: 80 fL (ref 80–100)

## 2013-03-29 LAB — GC/CHLAMYDIA PROBE AMP

## 2013-05-15 ENCOUNTER — Other Ambulatory Visit: Payer: Self-pay

## 2013-05-15 MED ORDER — INTERFERON BETA-1A 22 MCG/0.5ML ~~LOC~~ SOLN: 22.0000 ug | SUBCUTANEOUS | Status: DC

## 2013-05-18 ENCOUNTER — Telehealth: Payer: Self-pay | Admitting: Neurology

## 2013-05-18 ENCOUNTER — Ambulatory Visit: Payer: Self-pay | Admitting: Neurology

## 2013-05-18 NOTE — Telephone Encounter (Signed)
This patient did not show for the revisit appointment today. 

## 2013-06-21 ENCOUNTER — Other Ambulatory Visit: Payer: Self-pay | Admitting: Neurology

## 2013-06-21 NOTE — Telephone Encounter (Signed)
Although patient no showed last appt, she has another one scheduled in March

## 2013-07-26 ENCOUNTER — Ambulatory Visit: Payer: Self-pay | Admitting: Neurology

## 2013-07-26 ENCOUNTER — Telehealth: Payer: Self-pay | Admitting: Neurology

## 2013-07-26 NOTE — Telephone Encounter (Signed)
This is the second no-show for a revisit for this patient, the patient no showed in December of 2014 for a revisit as well.

## 2013-08-11 ENCOUNTER — Encounter: Payer: Self-pay | Admitting: *Deleted

## 2013-08-11 ENCOUNTER — Encounter: Payer: Self-pay | Admitting: Obstetrics and Gynecology

## 2013-08-17 ENCOUNTER — Encounter: Payer: Self-pay | Admitting: Obstetrics and Gynecology

## 2013-08-19 ENCOUNTER — Other Ambulatory Visit: Payer: Self-pay | Admitting: Neurology

## 2013-08-19 NOTE — Telephone Encounter (Signed)
Patient has not been seen since 09/2011.  She has no showed last 3 appts on 08/13/12; 05/18/13 and 07/26/13

## 2013-08-22 ENCOUNTER — Encounter: Payer: Self-pay | Admitting: Gastroenterology

## 2013-08-22 ENCOUNTER — Ambulatory Visit (INDEPENDENT_AMBULATORY_CARE_PROVIDER_SITE_OTHER): Payer: Medicare Other | Admitting: Obstetrics and Gynecology

## 2013-08-22 ENCOUNTER — Ambulatory Visit: Payer: Medicare Other | Admitting: Gastroenterology

## 2013-08-22 ENCOUNTER — Telehealth: Payer: Self-pay | Admitting: Gastroenterology

## 2013-08-22 ENCOUNTER — Encounter: Payer: Self-pay | Admitting: Obstetrics and Gynecology

## 2013-08-22 VITALS — BP 142/90 | Ht 63.0 in | Wt 189.8 lb

## 2013-08-22 DIAGNOSIS — N83209 Unspecified ovarian cyst, unspecified side: Secondary | ICD-10-CM

## 2013-08-22 DIAGNOSIS — N83202 Unspecified ovarian cyst, left side: Secondary | ICD-10-CM | POA: Insufficient documentation

## 2013-08-22 NOTE — Patient Instructions (Signed)

## 2013-08-22 NOTE — Telephone Encounter (Signed)
Pt was a no show

## 2013-08-22 NOTE — Telephone Encounter (Signed)
Mailed letter °

## 2013-08-22 NOTE — Telephone Encounter (Signed)
Please send letter.

## 2013-08-22 NOTE — Progress Notes (Signed)
This chart was scribed by Leone PayorSonum Patel, Medical Scribe, for Dr. Christin BachJohn Alitza Cowman on 08/22/13 at 4:20 PM. This chart was reviewed by Dr. Christin BachJohn Ronita Hargreaves and is accurate.   [facility] Visit  Patient name: Jill DiesStephanie H English MRN [id number]  Date of birth: 02-Feb-1966  CC & HPI:  Jill English is a 48 y.o. female presenting today for suprapubic abdominal pain. [facility] abdominal pain and was diagnosed with a bacterial infection. She was also told she had a left ovarian cyst but no ultrasound was performed. She denies significant weight gain/loss. [facility].   ROS:  Negative except noted above    Pertinent History Reviewed:  Medical & Surgical Hx:  Reviewed: Significant for  Past Medical History  Diagnosis Date  . MS (multiple sclerosis)   . Diverticulitis Sept 2011    CT documented  . Hypertension   . Hyperlipidemia   . Narcolepsy   . GERD (gastroesophageal reflux disease)   . Anxiety   . Hemorrhoids   . Depression   . Anxiety   . moderate obesity   . Headache     Past Surgical History  Procedure Laterality Date  . Partial hysterectomy  2008    with removal of rt ovary  . Carpal tunnel repair  2009  . Tubal ligation  1992  . Back surgery  2004  . Esophagogastroduodenoscopy  04/07/11    SLF: no evidence of h pylori/gastric body and antral mucosa with minimal chronic inflammation, negative H.pylori  . Colonoscopy  04/07/11    [id number]:large internal hemorrhoids/diverticula in the sigmoid colon  . Hemorrhoid surgery  07/21/2011    Procedure: HEMORRHOIDECTOMY;  Surgeon: Dalia HeadingMark A Jenkins, MD;  Location: [facility];  Service: General;  Laterality: N/A;    Medications: Reviewed & Updated - see associated section Social History: Reviewed -  reports that she has never smoked. She has never used smokeless tobacco.  Objective Findings:  Vitals: BP 142/90  Ht 5\' 3"  (1.6 m)  Wt 189 lb 12.8 oz (86.093 kg)  BMI 33.63  kg/m2  Physical Examination: General appearance - alert, well appearing, and in no distress and oriented to person, place, and time Pelvic - VULVA: normal appearing vulva with no masses, tenderness or lesions,  VAGINA: normal appearing vagina with normal color and discharge, no lesions,  CERVIX: surgically absent.  UTERUS: surgically absent, vaginal cuff well healed,  ADNEXA: normal adnexa in size, nontender and no masses  BEDSIDE ULTRASOUND: 2.1 cm x 1.5 cm simple cyst adjacent/superior to left ovary. I am unable to distinguish if this tube or a cyst on ovary. Left ovary is3.1 x 2.6 x 2.1 cm total diameters.(wnl)   Assessment & Plan:   A:1. Simple cyst vs small hydrosalpinx   P: 1. Follow up as needed for pain  Or changing sx.

## 2013-09-21 ENCOUNTER — Ambulatory Visit: Payer: Medicare Other | Admitting: Gastroenterology

## 2013-10-25 ENCOUNTER — Ambulatory Visit: Payer: Medicare Other | Admitting: Gastroenterology

## 2013-11-29 ENCOUNTER — Ambulatory Visit: Payer: Medicare Other | Admitting: Gastroenterology

## 2013-12-06 ENCOUNTER — Emergency Department (HOSPITAL_COMMUNITY)
Admission: EM | Admit: 2013-12-06 | Discharge: 2013-12-06 | Disposition: A | Discharge status: Home / Self Care | Payer: Medicare Other | Attending: Emergency Medicine | Admitting: Emergency Medicine

## 2013-12-06 ENCOUNTER — Encounter (HOSPITAL_COMMUNITY): Payer: Self-pay | Admitting: Emergency Medicine

## 2013-12-06 DIAGNOSIS — E785 Hyperlipidemia, unspecified: Secondary | ICD-10-CM | POA: Diagnosis not present

## 2013-12-06 DIAGNOSIS — S058X9A Other injuries of unspecified eye and orbit, initial encounter: Secondary | ICD-10-CM | POA: Insufficient documentation

## 2013-12-06 DIAGNOSIS — Z8659 Personal history of other mental and behavioral disorders: Secondary | ICD-10-CM | POA: Diagnosis not present

## 2013-12-06 DIAGNOSIS — Y939 Activity, unspecified: Secondary | ICD-10-CM | POA: Diagnosis not present

## 2013-12-06 DIAGNOSIS — Y929 Unspecified place or not applicable: Secondary | ICD-10-CM | POA: Insufficient documentation

## 2013-12-06 DIAGNOSIS — Z792 Long term (current) use of antibiotics: Secondary | ICD-10-CM | POA: Insufficient documentation

## 2013-12-06 DIAGNOSIS — Z79899 Other long term (current) drug therapy: Secondary | ICD-10-CM | POA: Diagnosis not present

## 2013-12-06 DIAGNOSIS — K219 Gastro-esophageal reflux disease without esophagitis: Secondary | ICD-10-CM | POA: Diagnosis not present

## 2013-12-06 DIAGNOSIS — W1809XA Striking against other object with subsequent fall, initial encounter: Secondary | ICD-10-CM | POA: Insufficient documentation

## 2013-12-06 DIAGNOSIS — Z8669 Personal history of other diseases of the nervous system and sense organs: Secondary | ICD-10-CM | POA: Diagnosis not present

## 2013-12-06 DIAGNOSIS — I10 Essential (primary) hypertension: Secondary | ICD-10-CM | POA: Insufficient documentation

## 2013-12-06 DIAGNOSIS — IMO0002 Reserved for concepts with insufficient information to code with codable children: Secondary | ICD-10-CM | POA: Diagnosis not present

## 2013-12-06 DIAGNOSIS — E669 Obesity, unspecified: Secondary | ICD-10-CM | POA: Insufficient documentation

## 2013-12-06 NOTE — ED Provider Notes (Signed)
CSN: [id number]     Arrival date & time 12/06/13  [id number] History  This chart was scribed for Benny LennertJoseph L Hutton Pellicane, MD,  by Ashley JacobsBrittany Andrews, ED Scribe. The patient was seen in room APA03/APA03 and the patient's care was started at 9:45 AM.   First MD Initiated Contact with Patient 12/06/13 0940     Chief Complaint  Patient presents with  . Facial Injury     (Consider location/radiation/quality/duration/timing/severity/associated sxs/prior Treatment) Patient is a 48 y.o. female presenting with facial injury. The history is provided by the patient and medical records. No language interpreter was used.  Facial Injury  HPI Comments: Jill English is a 48 y.o. female who presents to the Emergency Department complaining of right lateral face laceration after a closet door fell onto the right side of her face. The bleeding is controlled. Pt's tetanus shot is not UTD. Denies LOC. Past Medical History  Diagnosis Date  . MS (multiple sclerosis)   . Diverticulitis Sept 2011    CT documented  . Hypertension   . Hyperlipidemia   . Narcolepsy   . GERD (gastroesophageal reflux disease)   . Anxiety   . Hemorrhoids   . Depression   . Anxiety   . moderate obesity   . Headache    Past Surgical History  Procedure Laterality Date  . Partial hysterectomy  2008    with removal of rt ovary  . Carpal tunnel repair  2009  . Tubal ligation  1992  . Back surgery  2004  . Esophagogastroduodenoscopy  04/07/11    SLF: no evidence of h pylori/gastric body and antral mucosa with minimal chronic inflammation, negative H.pylori  . Colonoscopy  04/07/11    JXB:JYNWGSLF:large internal hemorrhoids/diverticula in the sigmoid colon  . Hemorrhoid surgery  07/21/2011    Procedure: HEMORRHOIDECTOMY;  Surgeon: Dalia HeadingMark A Jenkins, MD;  Location: [facility];  Service: General;  Laterality: N/A;   Family History  Problem Relation Age of Onset  . Colon cancer Neg Hx   . Anesthesia problems Neg Hx   . Hypotension Neg Hx   .  Malignant hyperthermia Neg Hx   . Pseudochol deficiency Neg Hx   . Heart disease Father   . Diabetes Father   . Cancer Father     prostate  . Multiple sclerosis Sister   . Diabetes Sister   . Hyperlipidemia Sister   . Cancer Sister   . Cancer Mother     lung  . Hypertension Mother   . Hyperlipidemia Brother   . Other Brother     rare blood disorder   History  Substance Use Topics  . Smoking status: Never Smoker   . Smokeless tobacco: Never Used  . Alcohol Use: No   OB History   Grav Para Term Preterm Abortions TAB SAB Ect Mult Living   4    1  1   2      Review of Systems  Skin: Positive for wound.  Neurological: Negative for syncope.  All other systems reviewed and are negative.     Allergies  Review of patient's allergies indicates no known allergies.  Home Medications   Prior to Admission medications   Medication Sig Start Date End Date Taking? Authorizing Provider  acetaminophen (TYLENOL) 650 MG CR tablet Take 650 mg by mouth as needed for pain.    Historical Provider, MD  buPROPion (WELLBUTRIN) 100 MG tablet Take 100 mg by mouth daily.    Historical Provider, MD  cetirizine (ZYRTEC) 10 MG  tablet Take 10 mg by mouth daily. As needed    Historical Provider, MD  cyclobenzaprine (FLEXERIL) 10 MG tablet Take 1 tablet (10 mg total) by mouth 3 (three) times daily as needed. 08/10/12   Tammy L. Triplett, PA-C  desonide (DESOWEN) 0.05 % lotion Apply 1 application topically Twice daily as needed. Apply to scalp if a flare-up of alopecia 02/10/11   Historical Provider, MD  fluticasone (FLONASE) 50 MCG/ACT nasal spray Place 2 sprays into the nose daily as needed. Sinuses    Historical Provider, MD  HAWTHORN PO Take 1 tablet by mouth daily.    Historical Provider, MD  hydrochlorothiazide (HYDRODIURIL) 25 MG tablet Take 25 mg by mouth daily.      Historical Provider, MD  HYDROcodone-acetaminophen (NORCO/VICODIN) 5-325 MG per tablet Take one-two tabs po q 4-6 hrs prn pain  08/10/12   Tammy L. Triplett, PA-C  ibuprofen (ADVIL,MOTRIN) 800 MG tablet Take 800 mg by mouth every 8 (eight) hours as needed. For pain      Historical Provider, MD  lubiprostone (AMITIZA) 24 MCG capsule Take 1 capsule (24 mcg total) by mouth 2 (two) times daily with a meal. 05/11/12   Nira Retort, NP  metroNIDAZOLE (FLAGYL) 500 MG tablet Take 1 tablet (500 mg total) by mouth 2 (two) times daily. 08/10/12   Tammy L. Triplett, PA-C  Omega-3 Fatty Acids (FISH OIL PO) Take 2 capsules by mouth at bedtime.    Historical Provider, MD  OVER THE COUNTER MEDICATION Take 2 tablets by mouth 2 (two) times daily. G.I. Revive    Historical Provider, MD  pantoprazole (PROTONIX) 40 MG tablet Take 1 tablet (40 mg total) by mouth daily. 30 minutes before first meal of day. 05/11/12   Nira Retort, NP  REBIF 22 MCG/0.5ML injection Inject 22 mcg Sub-q 3 times weekly Rotate site after each injection 06/21/13   York Spaniel, MD  simvastatin (ZOCOR) 40 MG tablet Take 40 mg by mouth every evening.      Historical Provider, MD  SUMAtriptan (IMITREX) 100 MG tablet Take 100 mg by mouth once. May repeat in 2 hours if headache persists or recurs.    Historical Provider, MD   BP 159/93  Pulse 65  Temp(Src) 98.2 F (36.8 C) (Oral)  Resp 16  Ht 5\' 3"  (1.6 m)  Wt 190 lb (86.183 kg)  BMI 33.67 kg/m2  SpO2 100% Physical Exam  Nursing note and vitals reviewed. Constitutional: She appears well-developed and well-nourished. No distress.  HENT:  Head: Normocephalic and atraumatic.  Right Ear: External ear normal.  Left Ear: External ear normal.  Eyes: Conjunctivae are normal.  Neck: Neck supple.  Cardiovascular: Normal rate.   Pulmonary/Chest: Effort normal. No respiratory distress.  Musculoskeletal: She exhibits no edema.  Neurological: She is alert. Cranial nerve deficit: no gross deficits.  Skin: Skin is warm and dry. No rash noted.  1 cm superficial laceration to lateral right eye.  Psychiatric: She has a normal  mood and affect.    ED Course  Procedures (including critical care time) DIAGNOSTIC STUDIES: Oxygen Saturation is 100% on room air, normal by my interpretation.    COORDINATION OF CARE:  9:48 AM Discussed course of care with pt which includes Dermabond  . Pt understands and agrees.   Labs Review Labs Reviewed - No data to display  Imaging Review No results found.   EKG Interpretation None      MDM   10:13 AM LACERATION REPAIR PROCEDURE NOTE  The  patient's identification was confirmed and consent was obtained. This procedure was performed by Benny Lennert, MD at 10:13 AM. Site:lateral aspect of right eye Sterile procedures observed sterilized water Anesthetic used (type and amt): none Antibx ointment applied Site anesthetized, irrigated with NS, explored without evidence of foreign body, wound well approximated, site covered with dry, sterile dressing.  Patient tolerated procedure well without complications. Instructions for care discussed verbally and patient provided with additional written instructions for homecare and f/u. Final diagnoses:  None    The chart was scribed for me under my direct supervision.  I personally performed the history, physical, and medical decision making and all procedures in the evaluation of this patient.Benny Lennert, MD 12/07/13 [phone]

## 2013-12-06 NOTE — ED Notes (Signed)
Pt states that her closet door fell from the track hitting her on right side of face area, pt has small laceration noted beside of left eye, bleeding controlled at present, denies any LOC

## 2013-12-06 NOTE — Discharge Instructions (Signed)
Follow up as needed

## 2014-01-05 ENCOUNTER — Encounter: Payer: Self-pay | Admitting: Gastroenterology

## 2014-01-05 ENCOUNTER — Ambulatory Visit (INDEPENDENT_AMBULATORY_CARE_PROVIDER_SITE_OTHER): Payer: Medicare Other | Admitting: Gastroenterology

## 2014-01-05 ENCOUNTER — Encounter (INDEPENDENT_AMBULATORY_CARE_PROVIDER_SITE_OTHER): Payer: Self-pay

## 2014-01-05 VITALS — BP 127/79 | HR 81 | Temp 97.1°F | Resp 18 | Ht 63.0 in | Wt 192.0 lb

## 2014-01-05 DIAGNOSIS — K589 Irritable bowel syndrome without diarrhea: Secondary | ICD-10-CM

## 2014-01-05 MED ORDER — LUBIPROSTONE 24 MCG PO CAPS: 24.0000 ug | ORAL_CAPSULE | Freq: Two times a day (BID) | ORAL | Status: DC

## 2014-01-05 NOTE — Assessment & Plan Note (Addendum)
Associated with upper abdominal pain and constipation. Sx improved with probiotics and Amitiza.  CONTINUE YOUR WEIGHT LOSS EFFORTS. DRINK WATER TO KEEP YOUR URINE LIGHT YELLOW. FOLLOW A HIGH FIBER/LOW FAT DIET.  HO GIVEN. INCREASE AMITIZA TO BID. CONTINUE ACIDOPHILUS. CALL IN 2 MOS IF SYMPTOMS ARE NOT WELL CONTROLLED. FOLLOW UP IN 4 TO 6 MOS.

## 2014-01-05 NOTE — Progress Notes (Signed)
Reminder in epic °

## 2014-01-05 NOTE — Progress Notes (Signed)
   Subjective:    Patient ID: Jill English, female    DOB: 1965-12-18, 48 y.o.   MRN: [id number]  CLAGGETT,ELIN, PA-C  HPI Bms: once a day, but was 2x/week. STARTED TAKING ACIDOPHILLUS PILLS 1 A DAY. STOPPED AMITIZA FOR AT LEAST 6 MOS BUT HAD SOME LEFT OVER AND WHEN SHE STARTED IT BACK IT HELPED A LOT AT ONCE A DAY. BELLY PAIN(TOP AND MIDDLE, LASTS MINS) GETS BETTER WHEN SHE MOVES BOWELS OR PASSES GAS. CAN FEEL BLOATED AND SORE. MILK: NOT A LOT, 1 Q2 WEEKS. YOGURT: NO ICE CREAM: 1-2X/MO. CHEESE: RARE, BUT NOT REALLY EATING. EXERCISING: 3X/WEEK. MORE DISCOMFORT WHEN SHE EATS GREASY FOODS OR SODAS. STOMACH MAY FEEL HOT. MAY FEEL NAUSEATED(MILD). HEARTBURN: 2X/MO. WORKING INCREASING WATER INTAKE.   PT DENIES FEVER, CHILLS, HEMATOCHEZIA, vomiting, melena, diarrhea, CHEST PAIN, SHORTNESS OF BREATH,  CHANGE IN BOWEL IN HABITS, OR problems swallowing.  Past Medical History  Diagnosis Date  . MS (multiple sclerosis)   . Diverticulitis Sept 2011    CT documented  . Hypertension   . Hyperlipidemia   . Narcolepsy   . GERD (gastroesophageal reflux disease)   . Anxiety   . Hemorrhoids   . Depression   . Anxiety   . moderate obesity   . Headache      Review of Systems     Objective:   Physical Exam  Vitals reviewed. Constitutional: She is oriented to person, place, and time. She appears well-developed and well-nourished. No distress.  HENT:  Head: Normocephalic and atraumatic.  Mouth/Throat: Oropharynx is clear and moist. No oropharyngeal exudate.  Eyes: Pupils are equal, round, and reactive to light. No scleral icterus.  Neck: Normal range of motion. Neck supple.  Cardiovascular: Normal rate, regular rhythm and normal heart sounds.   Pulmonary/Chest: Effort normal and breath sounds normal. No respiratory distress.  Abdominal: Soft. Bowel sounds are normal. She exhibits no distension. There is no tenderness.  Musculoskeletal: She exhibits no edema.  Lymphadenopathy:    She has  no cervical adenopathy.  Neurological: She is alert and oriented to person, place, and time.  NO  NEW FOCAL DEFICITS   Psychiatric: She has a normal mood and affect.          Assessment & Plan:

## 2014-01-05 NOTE — Patient Instructions (Signed)
CONTINUE YOUR WEIGHT LOSS EFFORTS.  DRINK WATER TO KEEP YOUR URINE LIGHT YELLOW.  FOLLOW A HIGH FIBER/LOW FAT DIET.  SEE INFO BELOW ON LOW FAT DIET.  INCREASE AMITIZA TO TWICE A DAY  CONTINUE ACIDOPHILUS.  CALL IN 2 MOS IF YOUR SYMPTOMS ARE NOT WELL CONTROLLED.  FOLLOW UP IN 4 TO 6 MOS.   Low-Fat Diet BREADS, CEREALS, PASTA, RICE, DRIED PEAS, AND BEANS These products are high in carbohydrates and most are low in fat. Therefore, they can be increased in the diet as substitutes for fatty foods. They too, however, contain calories and should not be eaten in excess. Cereals can be eaten for snacks as well as for breakfast.   FRUITS AND VEGETABLES It is good to eat fruits and vegetables. Besides being sources of fiber, both are rich in vitamins and some minerals. They help you get the daily allowances of these nutrients. Fruits and vegetables can be used for snacks and desserts.  MEATS Limit lean meat, chicken, [place], and fish to no more than 6 ounces per day. Beef, Pork, and Lamb Use lean cuts of beef, pork, and lamb. Lean cuts include:  Extra-lean ground beef.  Arm roast.  Sirloin tip.  Center-cut ham.  Round steak.  Loin chops.  Rump roast.  Tenderloin.  Trim all fat off the outside of meats before cooking. It is not necessary to severely decrease the intake of red meat, but lean choices should be made. Lean meat is rich in protein and contains a highly absorbable form of iron. Premenopausal women, in particular, should avoid reducing lean red meat because this could increase the risk for low red blood cells (iron-deficiency anemia).  Chicken and [place] These are good sources of protein. The fat of poultry can be reduced by removing the skin and underlying fat layers before cooking. Chicken and [place] can be substituted for lean red meat in the diet. Poultry should not be fried or covered with high-fat sauces. Fish and Shellfish Fish is a good source of protein. Shellfish contain  cholesterol, but they usually are low in saturated fatty acids. The preparation of fish is important. Like chicken and [place], they should not be fried or covered with high-fat sauces. EGGS Egg whites contain no fat or cholesterol. They can be eaten often. Try 1 to 2 egg whites instead of whole eggs in recipes or use egg substitutes that do not contain yolk. MILK AND DAIRY PRODUCTS Use skim or 1% milk instead of 2% or whole milk. Decrease whole milk, natural, and processed cheeses. Use nonfat or low-fat (2%) cottage cheese or low-fat cheeses made from vegetable oils. Choose nonfat or low-fat (1 to 2%) yogurt. Experiment with evaporated skim milk in recipes that call for heavy cream. Substitute low-fat yogurt or low-fat cottage cheese for sour cream in dips and salad dressings. Have at least 2 servings of low-fat dairy products, such as 2 glasses of skim (or 1%) milk each day to help get your daily calcium intake. FATS AND OILS Reduce the total intake of fats, especially saturated fat. Butterfat, lard, and beef fats are high in saturated fat and cholesterol. These should be avoided as much as possible. Vegetable fats do not contain cholesterol, but certain vegetable fats, such as coconut oil, palm oil, and palm kernel oil are very high in saturated fats. These should be limited. These fats are often used in bakery goods, processed foods, popcorn, oils, and nondairy creamers. Vegetable shortenings and some peanut butters contain hydrogenated oils, which are  also saturated fats. Read the labels on these foods and check for saturated vegetable oils. Unsaturated vegetable oils and fats do not raise blood cholesterol. However, they should be limited because they are fats and are high in calories. Total fat should still be limited to 30% of your daily caloric intake. Desirable liquid vegetable oils are corn oil, cottonseed oil, olive oil, canola oil, safflower oil, soybean oil, and sunflower oil. Peanut oil is not  as good, but small amounts are acceptable. Buy a heart-healthy tub margarine that has no partially hydrogenated oils in the ingredients. Mayonnaise and salad dressings often are made from unsaturated fats, but they should also be limited because of their high calorie and fat content. Seeds, nuts, peanut butter, olives, and avocados are high in fat, but the fat is mainly the unsaturated type. These foods should be limited mainly to avoid excess calories and fat. OTHER EATING TIPS Snacks  Most sweets should be limited as snacks. They tend to be rich in calories and fats, and their caloric content outweighs their nutritional value. Some good choices in snacks are graham crackers, melba toast, soda crackers, bagels (no egg), English muffins, fruits, and vegetables. These snacks are preferable to snack crackers, [place] fries, TORTILLA CHIPS, and POTATO chips. Popcorn should be air-popped or cooked in small amounts of liquid vegetable oil. Desserts Eat fruit, low-fat yogurt, and fruit ices instead of pastries, cake, and cookies. Sherbet, angel food cake, gelatin dessert, frozen low-fat yogurt, or other frozen products that do not contain saturated fat (pure fruit juice bars, frozen ice pops) are also acceptable.  COOKING METHODS Choose those methods that use little or no fat. They include: Poaching.  Braising.  Steaming.  Grilling.  Baking.  Stir-frying.  Broiling.  Microwaving.  Foods can be cooked in a nonstick pan without added fat, or use a nonfat cooking spray in regular cookware. Limit fried foods and avoid frying in saturated fat. Add moisture to lean meats by using water, broth, cooking wines, and other nonfat or low-fat sauces along with the cooking methods mentioned above. Soups and stews should be chilled after cooking. The fat that forms on top after a few hours in the refrigerator should be skimmed off. When preparing meals, avoid using excess salt. Salt can contribute to raising blood  pressure in some people.  EATING AWAY FROM HOME Order entres, potatoes, and vegetables without sauces or butter. When meat exceeds the size of a deck of cards (3 to 4 ounces), the rest can be taken home for another meal. Choose vegetable or fruit salads and ask for low-calorie salad dressings to be served on the side. Use dressings sparingly. Limit high-fat toppings, such as bacon, crumbled eggs, cheese, sunflower seeds, and olives. Ask for heart-healthy tub margarine instead of butter.

## 2014-01-09 NOTE — Progress Notes (Signed)
Cc'd to pcp 

## 2014-01-23 ENCOUNTER — Ambulatory Visit: Payer: Self-pay | Admitting: Neurology

## 2014-03-27 ENCOUNTER — Encounter: Payer: Self-pay | Admitting: Gastroenterology

## 2014-04-04 ENCOUNTER — Encounter: Payer: Self-pay | Admitting: Gastroenterology

## 2014-05-22 ENCOUNTER — Ambulatory Visit: Payer: Self-pay | Admitting: Family

## 2014-06-23 ENCOUNTER — Ambulatory Visit: Payer: Self-pay | Admitting: Family

## 2015-01-08 ENCOUNTER — Other Ambulatory Visit: Payer: Self-pay | Admitting: Gastroenterology

## 2015-09-10 ENCOUNTER — Other Ambulatory Visit: Payer: Self-pay | Admitting: Neurology

## 2015-09-10 DIAGNOSIS — G35 Multiple sclerosis: Secondary | ICD-10-CM

## 2015-10-01 ENCOUNTER — Ambulatory Visit
Admission: RE | Admit: 2015-10-01 | Discharge: 2015-10-01 | Disposition: A | Discharge status: Home / Self Care | Payer: Medicare Other | Source: Ambulatory Visit | Attending: Neurology | Admitting: Neurology

## 2015-10-01 ENCOUNTER — Ambulatory Visit: Payer: Medicare Other

## 2015-10-01 DIAGNOSIS — R9082 White matter disease, unspecified: Secondary | ICD-10-CM | POA: Insufficient documentation

## 2015-10-01 DIAGNOSIS — G35 Multiple sclerosis: Secondary | ICD-10-CM | POA: Insufficient documentation

## 2015-10-01 MED ORDER — GADOBENATE DIMEGLUMINE 529 MG/ML IV SOLN
20.0000 mL | Freq: Once | INTRAVENOUS | Status: AC | PRN
  Administered 2015-10-01: 18 mL via INTRAVENOUS

## 2015-10-02 ENCOUNTER — Ambulatory Visit: Payer: Medicare Other

## 2015-10-10 ENCOUNTER — Ambulatory Visit: Payer: Medicare Other | Attending: Neurology

## 2015-10-10 DIAGNOSIS — M545 Low back pain, unspecified: Secondary | ICD-10-CM

## 2015-10-10 NOTE — Patient Instructions (Signed)
[email or web address] Diaphragmatic breathing (initiating TA contraction) x 5 min  Lumbar support roll for posture

## 2015-10-10 NOTE — Therapy (Signed)
[facility] MAIN [facility] SERVICES [address] [place], [place], [address] Phone: [phone]   Fax:  [phone]  Physical Therapy Evaluation  Patient Details  Name: Jill English MRN: [id number] Date of Birth: 12-12-1965 Referring Provider: Ronalee Red  Encounter Date: 10/10/2015      PT End of Session - 10/10/15 0913    Visit Number 1   Number of Visits 13   Date for PT Re-Evaluation 11/07/15   Authorization Type 1/10   PT Start Time 0810   PT Stop Time 0855   PT Time Calculation (min) 45 min   Activity Tolerance Patient tolerated treatment well   Behavior During Therapy [facility] for tasks assessed/performed      Past Medical History  Diagnosis Date  . MS (multiple sclerosis) (HCC)   . Diverticulitis Sept 2011    CT documented  . Hypertension   . Hyperlipidemia   . Narcolepsy   . GERD (gastroesophageal reflux disease)   . Anxiety   . Hemorrhoids   . Depression   . Anxiety   . moderate obesity   . Headache     Past Surgical History  Procedure Laterality Date  . Partial hysterectomy  2008    with removal of rt ovary  . Carpal tunnel repair  2009  . Tubal ligation  1992  . Back surgery  2004  . Esophagogastroduodenoscopy  04/07/11    SLF: no evidence of h pylori/gastric body and antral mucosa with minimal chronic inflammation, negative H.pylori  . Colonoscopy  04/07/11    ONG:EXBMW internal hemorrhoids/diverticula in the sigmoid colon  . Hemorrhoid surgery  07/21/2011    Procedure: HEMORRHOIDECTOMY;  Surgeon: Dalia Heading, MD;  Location: [facility];  Service: General;  Laterality: N/A;    There were no vitals filed for this visit.       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 she reports she had herniated discs where she had surgery. After that she did not have much pain. pt reports this past August she had liposuction on  her trunk and since then she has had back pain that she reports is getting worse. she reports lying/sitting makes her pain worse and walking around makes it better. she reports not very often she will feel burning into the L hip.    Patient Stated Goals reduce pain, improve strength    Currently in Pain? Yes   Pain Score 3    Pain Location Back   Pain Orientation Lower   Pain Descriptors / Indicators Aching;Sore   Pain Type Chronic pain            [facility] PT Assessment - 10/10/15 0001    Assessment   Medical Diagnosis LBP   Referring Provider [place]   Onset Date/Surgical Date 01/10/15   Prior Therapy PT 2003   Precautions   Precautions None   Restrictions   Weight Bearing Restrictions No   Balance Screen   Has the patient fallen in the past 6 months No   Has the patient had a decrease in activity level because of a fear of falling?  No   Is the patient reluctant to leave their home because of a fear of falling?  No   Home Nurse, mental health Private residence   Living Arrangements Alone   Available Help at Discharge Neighbor   Type  of Home House   Home Access Stairs to enter   Entrance Stairs-Number of Steps 15      POSTURE/OBSERVATION: Reduced lumbar lordosis in sitting   PROM/AROM: Lumbar flexion: 40 deg Lumbar extension: 15 deg Sidebend: 45deg R and L Repeated movement testing no change in symptoms  STRENGTH:  Graded on a 0-5 scale Muscle Group Left Right  Rectus abd 2   Extensor group paraspinals 3                   Hip Flex 4- 4-  Hip Abd 4- 4-  Hip Add 4- 4-  Hip Ext 4- 4-  Hip IR/ER    Knee Flex 5 5  Knee Ext 4 4  Ankle DF 5 5  Ankle PF 4 4   SENSATION: WNL Myotomes: WNL Dermatomes: WNL Reflexes 2+ bilaterally L3/S1  SPECIAL TESTS:   SLR (-) Repeated movements (-)  Palpation: Myofacial restrictions to thoracolumbar fascia and abdominal area Hypomobile lumbar spine Pt is tender over lumbar spinous process and with  palpation to the abdominal musculature.   GAIT: WFL OUTCOME MEASURES: TEST Outcome Interpretation  ODI 50% Moderate disability                                          PT Education - 10/10/15 0913    Education provided Yes   Education Details plan of care   Person(s) Educated Patient   Methods Explanation   Comprehension Verbalized understanding             PT Long Term Goals - 10/10/15 0916    PT LONG TERM GOAL #1   Title pt will be able to improve lumbar flexion by 20 deg to be able to reach the floor to pick up a light item   Time 4   Period Weeks   Status New   PT LONG TERM GOAL #2   Title pt will reduce ODI x 15pts to improve function    Baseline 50%    Time 6   Period Weeks   Status New   PT LONG TERM GOAL #3   Title pt will be independent and compliant with HEP   Time 4   Period Weeks   Status New   PT LONG TERM GOAL #4   Title pt will demonstrates proper lifting mechanics of 15lb item from floor to waist level x 5 with pain <3/10   Time 6   Period Weeks   Status New               Plan - 10/10/15 0914    Clinical Impression Statement pt presents as 50 y/o F with hx of MS and lumbar fusion in 2003. pt has at this point 9 mo history of increased LBP without radiulopathy secondary to what she reports post operative changes following a liposuction. pt reports soreness/tightness throughout the trunk including abdomen. pt has limisted lumbar ROM, impaired hip/leg and core strength as well as myofacial tightness. pt would benefit from skilled PT services to address these deficits to maximize function and reduce pain.    Rehab Potential Good   Clinical Impairments Affecting Rehab Potential lives alone, history of MS, history of lumbar fusion,    PT Frequency 2x / week   PT Duration 6 weeks   PT Treatment/Interventions Aquatic Therapy;ADLs/Self Care Home Management;Electrical Stimulation;Moist  Heat;Cryotherapy;Ultrasound;Traction;Neuromuscular re-education;Therapeutic exercise;Therapeutic activities;Functional  mobility training;Stair training;Gait training;Manual techniques;Scar mobilization;Passive range of motion;Dry needling   Consulted and Agree with Plan of Care Patient      Patient will benefit from skilled therapeutic intervention in order to improve the following deficits and impairments:  Decreased strength, Improper body mechanics, Postural dysfunction, Impaired flexibility, Pain, Hypomobility, Decreased range of motion, Increased fascial restricitons, Decreased scar mobility  Visit Diagnosis: Bilateral low back pain without sciatica - Plan: PT plan of care cert/re-cert      G-Codes - 10/30/15 0920    Functional Assessment Tool Used ODI, ROM   Functional Limitation Changing and maintaining body position   Changing and Maintaining Body Position Current Status (Z6109) At least 40 percent but less than 60 percent impaired, limited or restricted   Changing and Maintaining Body Position Goal Status (U0454) At least 1 percent but less than 20 percent impaired, limited or restricted       Problem List Patient Active Problem List   Diagnosis Date Noted  . Irritable bowel syndrome (IBS) 01/05/2014  . Ovarian cyst, left 08/22/2013  . Epigastric pain 05/11/2012  . Diverticulitis 03/12/2011   Carlyon Shadow. Broden Holt, PT, DPT [phone]  Rolen Conger 10-30-2015, 9:22 AM  [place] MAIN [facility] SERVICES [address] [place], [place], [address] Phone: [phone]   Fax:  [phone]  Name: Berania Blumenshine MRN: [id number] Date of Birth: 05/23/1966

## 2015-10-15 ENCOUNTER — Ambulatory Visit: Payer: Medicare Other

## 2015-10-15 DIAGNOSIS — M545 Low back pain, unspecified: Secondary | ICD-10-CM

## 2015-10-15 NOTE — Patient Instructions (Signed)
Supine Knee-to-Chest, Unilateral    Lie on back, hands clasped behind one knee. Pull knee in toward chest until a comfortable stretch is felt in lower back and buttocks. Hold _10__ seconds.  Repeat _10__ times per session. Do _2__ sessions per day.  Copyright  VHI. All rights reserved.  Knee Roll    Lying on back, with knees bent and feet flat on floor, arms outstretched to sides, slowly roll both knees to side, hold 5 seconds. Back to starting position, hold 5 seconds. Then to opposite side, hold 5 seconds. Return to starting position. Keep shoulders and arms in contact with floor. Do 20 repetitions    Copyright  VHI. All rights reserved.  Pelvic Tilt: Posterior - Legs Bent (Supine)    Tighten stomach and flatten back by rolling pelvis down. Hold __5__ seconds. Relax. Repeat __10__ times per set. Do _1___ sets per session. Do _2___ sessions per day.  http://orth.exer.us/203   Copyright  VHI. All rights reserved.  CORE CONDITIONING ON BALL: Side Stretch    Lay over a pillow- the painful side up- arm overhead - hold 1 min x 3 you can do each side Repeat ___ times. Repeat on other side. Do ___ times per day. To increase intensity, straighten both legs.   Copyright  VHI. All rights reserved.  Spinal Mobility (Cat / Camel): Flexion / Extension    Get ON TARGET. Knees on full roller, hands on flat up roller:  1.Cat: Buttocks up, arch spine segmentally, bottom to top: lift chest as head moves back, look up. 2.Camel: Reverse movement. Close eyes, lower head, tuck chin, compress chest and abdomen, round back. Hold __5_ seconds. Repeat _10__ times. Do _2__ sessions per day.  Copyright  VHI. All rights reserved.  (Home) Extension: Thoracic With Lumbar Lock - Sitting    Sit with back against chair, knees bent, hands locked behind head. Breathe in, extending head and trunk over chair back. Breathe out. Hold position for __2__ breaths. Repeat __10__ times per set. Do _1___  sets per session. Do _2___ sessions per day  Copyright  VHI. All rights reserved.

## 2015-10-16 NOTE — Therapy (Signed)
[facility] MAIN [facility] SERVICES [address] [place], [place], [address] Phone: [phone]   Fax:  [phone]  Physical Therapy Treatment  Patient Details  Name: Jill English MRN: [id number] Date of Birth: 28-Nov-1965 Referring Provider: Ronalee Red  Encounter Date: 10/15/2015      PT End of Session - 10/16/15 0858    Visit Number 2   Number of Visits 13   Date for PT Re-Evaluation 11/07/15   Authorization Type 2/10   PT Start Time 1645   PT Stop Time 1745   PT Time Calculation (min) 60 min   Activity Tolerance Patient tolerated treatment well   Behavior During Therapy [facility] for tasks assessed/performed      Past Medical History  Diagnosis Date  . MS (multiple sclerosis) (HCC)   . Diverticulitis Sept 2011    CT documented  . Hypertension   . Hyperlipidemia   . Narcolepsy   . GERD (gastroesophageal reflux disease)   . Anxiety   . Hemorrhoids   . Depression   . Anxiety   . moderate obesity   . Headache     Past Surgical History  Procedure Laterality Date  . Partial hysterectomy  2008    with removal of rt ovary  . Carpal tunnel repair  2009  . Tubal ligation  1992  . Back surgery  2004  . Esophagogastroduodenoscopy  04/07/11    SLF: no evidence of h pylori/gastric body and antral mucosa with minimal chronic inflammation, negative H.pylori  . Colonoscopy  04/07/11    CLE:[id number] internal hemorrhoids/diverticula in the sigmoid colon  . Hemorrhoid surgery  07/21/2011    Procedure: HEMORRHOIDECTOMY;  Surgeon: Dalia Heading, MD;  Location: [facility];  Service: General;  Laterality: N/A;    There were no vitals filed for this visit.      Subjective Assessment - 10/16/15 0856    Subjective pt reports her lower back and R flank pain has been worse the past day or 2. she did practice her diaphragmatic breathing, but forgot to use the lumbar support when seated.    Patient Stated Goals reduce pain, improve strength    Currently in Pain? Yes   Pain Score 6    Pain Location Back   Pain Orientation Lower;Lateral;Right   Pain Descriptors / Indicators Aching;Tightness       MHP applied to lower back in prone x 5 min no charge  Manual therapy: Soft tissue massage, MFR and ischemic trigger point release to lower back with more focus on the R side. Trigger point release in sidelying to R QL.  CPA mobilizations ot T4-L5 Sunrise stretch (R) with gentle PROM into L lateral flexion to aid with QL stretch  Therex: SKTC 10s x 10 Diaphragmatic breathing (initiating TA contraction) x 2 min Diaphragmatic breathing (initiating TA contraction) x 2 min Diaphragmatic breathing (initiating TA contraction) with posterior pelvic tilt x 15 Cat/camel x 10 Diaphragmatic breathing (initiating TA contraction) with bridging 2x10 repeated thoracic ext over chair x 10 LTR x 20  Pt requires min verbal and tactile cues for proper exercise performance                         PT Education - 10/16/15 0857    Education provided Yes   Education Details HEP progression   Person(s) Educated Patient   Methods Explanation;Handout   Comprehension Verbalized understanding;Returned demonstration  PT Long Term Goals - 10/10/15 0916    PT LONG TERM GOAL #1   Title pt will be able to improve lumbar flexion by 20 deg to be able to reach the floor to pick up a light item   Time 4   Period Weeks   Status New   PT LONG TERM GOAL #2   Title pt will reduce ODI x 15pts to improve function    Baseline 50%    Time 6   Period Weeks   Status New   PT LONG TERM GOAL #3   Title pt will be independent and compliant with HEP   Time 4   Period Weeks   Status New   PT LONG TERM GOAL #4   Title pt will demonstrates proper lifting mechanics of 15lb item from floor to waist level x 5 with pain <3/10   Time 6   Period Weeks   Status New               Plan - 10/16/15 1610    Clinical Impression  Statement pt responded well to manual therapy today including MFR, STM and trigger point release to the R QL which was quite hypertonic. initiated core strengthening and stretching today with focus in lumbar and thoracic mobility. pt reported less pain following session    Rehab Potential Good   Clinical Impairments Affecting Rehab Potential lives alone, history of MS, history of lumbar fusion,    PT Frequency 2x / week   PT Duration 6 weeks   PT Treatment/Interventions Aquatic Therapy;ADLs/Self Care Home Management;Electrical Stimulation;Moist Heat;Cryotherapy;Ultrasound;Traction;Neuromuscular re-education;Therapeutic exercise;Therapeutic activities;Functional mobility training;Stair training;Gait training;Manual techniques;Scar mobilization;Passive range of motion;Dry needling   Consulted and Agree with Plan of Care Patient      Patient will benefit from skilled therapeutic intervention in order to improve the following deficits and impairments:  Decreased strength, Improper body mechanics, Postural dysfunction, Impaired flexibility, Pain, Hypomobility, Decreased range of motion, Increased fascial restricitons, Decreased scar mobility  Visit Diagnosis: Bilateral low back pain without sciatica     Problem List Patient Active Problem List   Diagnosis Date Noted  . Irritable bowel syndrome (IBS) 01/05/2014  . Ovarian cyst, left 08/22/2013  . Epigastric pain 05/11/2012  . Diverticulitis 03/12/2011   Carlyon Shadow. [name], PT, DPT [phone]  [name] 10/16/2015, 9:09 AM  [place] [facility] MAIN [facility] SERVICES [address] [place], [place], [address] Phone: [phone]   Fax:  [phone]  Name: Jill English MRN: [id number] Date of Birth: Apr 05, 1966

## 2015-10-24 ENCOUNTER — Ambulatory Visit: Payer: Medicare Other

## 2015-10-29 ENCOUNTER — Ambulatory Visit: Payer: Medicare Other | Attending: Neurology

## 2015-10-29 DIAGNOSIS — M545 Low back pain, unspecified: Secondary | ICD-10-CM

## 2015-10-29 NOTE — Therapy (Signed)
[place] [facility] MAIN [facility] SERVICES [address] [place], [place], [address] Phone: [phone]   Fax:  [phone]  Physical Therapy Treatment  Patient Details  Name: Jill English MRN: [id number] Date of Birth: [birth year] Referring Provider: Ronalee English  Encounter Date: 10/29/2015      PT End of Session - 10/29/15 1248    Visit Number 3   Number of Visits 13   Date for PT Re-Evaluation 11/07/15   Authorization Type 3/10   PT Start Time 1115   PT Stop Time 1200   PT Time Calculation (min) 45 min   Activity Tolerance Patient tolerated treatment well   Behavior During Therapy [facility] for tasks assessed/performed      Past Medical History  Diagnosis Date  . MS (multiple sclerosis) (HCC)   . Diverticulitis Sept 2011    CT documented  . Hypertension   . Hyperlipidemia   . Narcolepsy   . GERD (gastroesophageal reflux disease)   . Anxiety   . Hemorrhoids   . Depression   . Anxiety   . moderate obesity   . Headache     Past Surgical History  Procedure Laterality Date  . Partial hysterectomy  2008    with removal of rt ovary  . Carpal tunnel repair  2009  . Tubal ligation  1992  . Back surgery  2004  . Esophagogastroduodenoscopy  04/07/11    SLF: no evidence of h pylori/gastric body and antral mucosa with minimal chronic inflammation, negative H.pylori  . Colonoscopy  04/07/11    [id number] internal hemorrhoids/diverticula in the sigmoid colon  . Hemorrhoid surgery  07/21/2011    Procedure: HEMORRHOIDECTOMY;  Surgeon: Dalia Heading, MD;  Location: [facility];  Service: General;  Laterality: N/A;    There were no vitals filed for this visit.      Subjective Assessment - 10/29/15 1246    Subjective pt reports her pain is a little better since starting HEP   Patient Stated Goals reduce pain, improve strength    Currently in Pain? Yes   Pain Score 4    Pain Location Flank   Pain Orientation Right   Pain Descriptors / Indicators  Aching;Sore   Pain Type Chronic pain        Therex: SKTC 10s x 10 Diaphragmatic breathing (initiating TA contraction) x 2 min Diaphragmatic breathing (initiating TA contraction) with posterior pelvic tilt x 15 Cat/camel x 10 Diaphragmatic breathing (initiating TA contraction) with bridging 2x10 repeated thoracic ext over chair x 10 LTR x 20  Pt requires min verbal and tactile cues for proper exercise performance    Manual therapy: MFR to thoracolumbar fascia x 10 min CPA mobs to T5-L5 grade III 3 bouts 20s each Ischemic trigger point release to R QL   x90s Pt reports feeling more mobile after therapy                       PT Education - 10/29/15 1247    Education provided Yes   Education Details repeated fwd flexion    Person(s) Educated Patient   Methods Explanation   Comprehension Verbalized understanding             PT Long Term Goals - 10/10/15 0916    PT LONG TERM GOAL #1   Title pt will be able to improve lumbar flexion by 20 deg to be able to reach the floor to pick up a light item   Time 4  Period Weeks   Status New   PT LONG TERM GOAL #2   Title pt will reduce ODI x 15pts to improve function    Baseline 50%    Time 6   Period Weeks   Status New   PT LONG TERM GOAL #3   Title pt will be independent and compliant with HEP   Time 4   Period Weeks   Status New   PT LONG TERM GOAL #4   Title pt will demonstrates proper lifting mechanics of 15lb item from floor to waist level x 5 with pain <3/10   Time 6   Period Weeks   Status New               Plan - 10/29/15 1248    Clinical Impression Statement pt conitnues to show signficant myofascial restrictions especially to the lumbar area. she is quite hypomobile throughout as well. pt showed good performance of initial HEP needing only min cues to correct    Rehab Potential Good   Clinical Impairments Affecting Rehab Potential lives alone, history of MS, history of lumbar  fusion,    PT Frequency 2x / week   PT Duration 6 weeks   PT Treatment/Interventions Aquatic Therapy;ADLs/Self Care Home Management;Electrical Stimulation;Moist Heat;Cryotherapy;Ultrasound;Traction;Neuromuscular re-education;Therapeutic exercise;Therapeutic activities;Functional mobility training;Stair training;Gait training;Manual techniques;Scar mobilization;Passive range of motion;Dry needling   Consulted and Agree with Plan of Care Patient      Patient will benefit from skilled therapeutic intervention in order to improve the following deficits and impairments:  Decreased strength, Improper body mechanics, Postural dysfunction, Impaired flexibility, Pain, Hypomobility, Decreased range of motion, Increased fascial restricitons, Decreased scar mobility  Visit Diagnosis: Bilateral low back pain without sciatica     Problem List Patient Active Problem List   Diagnosis Date Noted  . Irritable bowel syndrome (IBS) 01/05/2014  . Ovarian cyst, left 08/22/2013  . Epigastric pain 05/11/2012  . Diverticulitis 03/12/2011   Carlyon Shadow. Marjarie Irion, PT, DPT [phone]  [name] 10/29/2015, 12:52 PM  [place] [facility] MAIN [facility] SERVICES [address] [place], [place], [address] Phone: [phone]   Fax:  [phone]  Name: Jill English MRN: [id number] Date of Birth: 1966-02-08

## 2015-10-29 NOTE — Patient Instructions (Addendum)
[email or web address] RFIS x 10 4 x/day

## 2015-10-31 ENCOUNTER — Ambulatory Visit: Payer: Medicare Other

## 2015-10-31 DIAGNOSIS — M545 Low back pain, unspecified: Secondary | ICD-10-CM

## 2015-10-31 NOTE — Patient Instructions (Addendum)
     Lie with rolled towel under spine, letting shoulders relax toward floor. Lie _3__ minutes. Do __2_ times per day.  http://ss.exer.us/355   Copyright  VHI. All rights reserved.

## 2015-11-01 NOTE — Therapy (Signed)
[facility] [facility] MAIN [facility] SERVICES [address] [place], [place], [address] Phone: [phone]   Fax:  [phone]  Physical Therapy Treatment  Patient Details  Name: Jill English MRN: [id number] Date of Birth: 01-Aug-1965 Referring Provider: Ronalee Red  Encounter Date: 10/31/2015      PT End of Session - 11/01/15 0825    Visit Number 4   Number of Visits 13   Date for PT Re-Evaluation 11/07/15   Authorization Type 4/10   PT Start Time 0800   PT Stop Time 0845   PT Time Calculation (min) 45 min   Activity Tolerance Patient tolerated treatment well   Behavior During Therapy [facility] for tasks assessed/performed      Past Medical History  Diagnosis Date  . MS (multiple sclerosis) (HCC)   . Diverticulitis Sept 2011    CT documented  . Hypertension   . Hyperlipidemia   . Narcolepsy   . GERD (gastroesophageal reflux disease)   . Anxiety   . Hemorrhoids   . Depression   . Anxiety   . moderate obesity   . Headache     Past Surgical History  Procedure Laterality Date  . Partial hysterectomy  2008    with removal of rt ovary  . Carpal tunnel repair  2009  . Tubal ligation  1992  . Back surgery  2004  . Esophagogastroduodenoscopy  04/07/11    SLF: no evidence of h pylori/gastric body and antral mucosa with minimal chronic inflammation, negative H.pylori  . Colonoscopy  04/07/11    [id number] internal hemorrhoids/diverticula in the sigmoid colon  . Hemorrhoid surgery  07/21/2011    Procedure: HEMORRHOIDECTOMY;  Surgeon: Dalia Heading, MD;  Location: [facility];  Service: General;  Laterality: N/A;    There were no vitals filed for this visit.      Subjective Assessment - 11/01/15 0823    Subjective reports her back does feel a little better. she is compliant with HEP   Patient Stated Goals reduce pain, improve strength    Currently in Pain? Yes   Pain Score 4    Pain Location Back   Pain Orientation Right;Lower   Pain  Descriptors / Indicators Aching;Sore       Therex: Bridges 2x10, min verbal cues for breathing Supine pec stretch over half foam roll x 2 min; min verbal cues for proper technique Seated thoracic extension over towel roll x 25  Cat and camel stretch x 15 Prayer stretch x 2 min; min verbal cues for technique  Manual therapy: Deep tissue effleurage to R lumbar erector spinae and sacroiliac joint; moderate restrictions noted which decreased after treatment; pt reported decreased pain after treatment           PT Education - 11/01/15 0824    Education provided Yes   Education Details prayer stretch, supine pec stretch    Person(s) Educated Patient   Methods Explanation;Handout   Comprehension Verbalized understanding;Returned demonstration             PT Long Term Goals - 10/10/15 0916    PT LONG TERM GOAL #1   Title pt will be able to improve lumbar flexion by 20 deg to be able to reach the floor to pick up a light item   Time 4   Period Weeks   Status New   PT LONG TERM GOAL #2   Title pt will reduce ODI x 15pts to improve function    Baseline 50%  Time 6   Period Weeks   Status New   PT LONG TERM GOAL #3   Title pt will be independent and compliant with HEP   Time 4   Period Weeks   Status New   PT LONG TERM GOAL #4   Title pt will demonstrates proper lifting mechanics of 15lb item from floor to waist level x 5 with pain <3/10   Time 6   Period Weeks   Status New               Plan - 11/01/15 0825    Clinical Impression Statement pt responded well to manual therapy with less pain and tightness. progressed HEP to include prayer stretch /childs pose and pectoralis stretching for posutre. pt demonstrates good understanding. next f/u visit will be for pt education regarding abdominal massage/scar tissue/ soft tissue release.    Rehab Potential Good   Clinical Impairments Affecting Rehab Potential lives alone, history of MS, history of lumbar fusion,     PT Frequency 2x / week   PT Duration 6 weeks   PT Treatment/Interventions Aquatic Therapy;ADLs/Self Care Home Management;Electrical Stimulation;Moist Heat;Cryotherapy;Ultrasound;Traction;Neuromuscular re-education;Therapeutic exercise;Therapeutic activities;Functional mobility training;Stair training;Gait training;Manual techniques;Scar mobilization;Passive range of motion;Dry needling   Consulted and Agree with Plan of Care Patient      Patient will benefit from skilled therapeutic intervention in order to improve the following deficits and impairments:  Decreased strength, Improper body mechanics, Postural dysfunction, Impaired flexibility, Pain, Hypomobility, Decreased range of motion, Increased fascial restricitons, Decreased scar mobility  Visit Diagnosis: Bilateral low back pain without sciatica     Problem List Patient Active Problem List   Diagnosis Date Noted  . Irritable bowel syndrome (IBS) 01/05/2014  . Ovarian cyst, left 08/22/2013  . Epigastric pain 05/11/2012  . Diverticulitis 03/12/2011   Carlyon Shadow. Treyvone Chelf, PT, DPT [phone]  Danis Pembleton 11/01/2015, 8:30 AM  [place] [facility] MAIN [facility] SERVICES [address] [place], [place], [address] Phone: [phone]   Fax:  [phone]  Name: Jamilette Colyar MRN: [id number] Date of Birth: 1965-07-18

## 2015-11-05 ENCOUNTER — Encounter: Payer: Self-pay | Admitting: Physical Therapy

## 2015-11-05 ENCOUNTER — Ambulatory Visit: Payer: Medicare Other | Admitting: Physical Therapy

## 2015-11-05 DIAGNOSIS — M545 Low back pain, unspecified: Secondary | ICD-10-CM

## 2015-11-05 NOTE — Patient Instructions (Addendum)
Hand out on scar massage   Pelvic tilt forward and back to stretch the scar when seated with feet under knees  10 x 3  Or standing with knees slightly unlocked

## 2015-11-06 NOTE — Therapy (Signed)
[place] [facility] MAIN [facility] SERVICES [address] [place], [place], [address] Phone: [phone]   Fax:  [phone]  Physical Therapy Treatment  Patient Details  Name: Jill English MRN: [id number] Date of Birth: [birth year] Referring Provider: Ronalee Red  Encounter Date: 11/05/2015      PT End of Session - 11/06/15 2104    Visit Number 5   Number of Visits 13   Date for PT Re-Evaluation 11/07/15   Authorization Type 5/10   PT Start Time 0912   PT Stop Time 0950   PT Time Calculation (min) 38 min   Activity Tolerance Patient tolerated treatment well   Behavior During Therapy [facility] for tasks assessed/performed      Past Medical History  Diagnosis Date  . MS (multiple sclerosis) (HCC)   . Diverticulitis Sept 2011    CT documented  . Hypertension   . Hyperlipidemia   . Narcolepsy   . GERD (gastroesophageal reflux disease)   . Anxiety   . Hemorrhoids   . Depression   . Anxiety   . moderate obesity   . Headache     Past Surgical History  Procedure Laterality Date  . Partial hysterectomy  2008    with removal of rt ovary  . Carpal tunnel repair  2009  . Tubal ligation  1992  . Back surgery  2004  . Esophagogastroduodenoscopy  04/07/11    SLF: no evidence of h pylori/gastric body and antral mucosa with minimal chronic inflammation, negative H.pylori  . Colonoscopy  04/07/11    [id number] internal hemorrhoids/diverticula in the sigmoid colon  . Hemorrhoid surgery  07/21/2011    Procedure: HEMORRHOIDECTOMY;  Surgeon: Dalia Heading, MD;  Location: [facility];  Service: General;  Laterality: N/A;    There were no vitals filed for this visit.      Subjective Assessment - 11/06/15 2101    Subjective Pt reports feeling sore in her stomach and lower back area. Pt states since her lyposuction surgery, she feels in her low back feels "drawing in".     Patient Stated Goals reduce pain, improve strength             [facility] PT  Assessment - 11/06/15 2102    Palpation   Palpation comment increased scar restrictions along lumbar scar (decreased post-Tx)                      [facility] Adult PT Treatment/Exercise - 11/06/15 2102    Therapeutic Activites    Therapeutic Activities --  see pt instructions   Manual Therapy   Manual therapy comments scar massage (transverse) and fascial release along lumbar scar and fascial release along R flank                 PT Education - 11/06/15 2103    Education provided Yes   Education Details HEP, self scar massage, pelvic tilt seated and standing   Person(s) Educated Patient   Methods Explanation;Demonstration;Tactile cues;Verbal cues;Handout   Comprehension Returned demonstration;Verbalized understanding             PT Long Term Goals - 10/10/15 0916    PT LONG TERM GOAL #1   Title pt will be able to improve lumbar flexion by 20 deg to be able to reach the floor to pick up a light item   Time 4   Period Weeks   Status New   PT LONG TERM GOAL #2   Title pt  will reduce ODI x 15pts to improve function    Baseline 50%    Time 6   Period Weeks   Status New   PT LONG TERM GOAL #3   Title pt will be independent and compliant with HEP   Time 4   Period Weeks   Status New   PT LONG TERM GOAL #4   Title pt will demonstrates proper lifting mechanics of 15lb item from floor to waist level x 5 with pain <3/10   Time 6   Period Weeks   Status New               Plan - 11/06/15 2104    Clinical Impression Statement Pt reported decrease lumbar pain from 4/10 to 0/10 following manual Tx today. Pt demo'd self-massage technique correctly and voiced understanding on how to practice at home. Pt also found relief with pelvic tilts in seated and standing positions with postural cues for alignment. Pt continues to benefit from skilled PT.    Rehab Potential Good   Clinical Impairments Affecting Rehab Potential lives alone, history of MS, history of  lumbar fusion,    PT Frequency 2x / week   PT Duration 6 weeks   PT Treatment/Interventions Aquatic Therapy;ADLs/Self Care Home Management;Electrical Stimulation;Moist Heat;Cryotherapy;Ultrasound;Traction;Neuromuscular re-education;Therapeutic exercise;Therapeutic activities;Functional mobility training;Stair training;Gait training;Manual techniques;Scar mobilization;Passive range of motion;Dry needling   Consulted and Agree with Plan of Care Patient      Patient will benefit from skilled therapeutic intervention in order to improve the following deficits and impairments:  Decreased strength, Improper body mechanics, Postural dysfunction, Impaired flexibility, Pain, Hypomobility, Decreased range of motion, Increased fascial restricitons, Decreased scar mobility  Visit Diagnosis: Bilateral low back pain without sciatica     Problem List Patient Active Problem List   Diagnosis Date Noted  . Irritable bowel syndrome (IBS) 01/05/2014  . Ovarian cyst, left 08/22/2013  . Epigastric pain 05/11/2012  . Diverticulitis 03/12/2011    Mariane Masters ,PT, DPT, [id number]-[id number]  11/06/2015, 9:06 PM  [place] [facility] MAIN [facility] SERVICES [address] [place], [place], [address] Phone: [phone]   Fax:  [phone]  Name: Jill English MRN: [id number] Date of Birth: 13-Nov-1965

## 2015-11-21 ENCOUNTER — Ambulatory Visit: Payer: Medicare Other | Admitting: Physical Therapy

## 2015-11-23 ENCOUNTER — Other Ambulatory Visit: Payer: Self-pay | Admitting: Internal Medicine

## 2015-11-23 DIAGNOSIS — Z1231 Encounter for screening mammogram for malignant neoplasm of breast: Secondary | ICD-10-CM

## 2015-12-13 ENCOUNTER — Ambulatory Visit: Payer: Medicare Other | Admitting: Physical Therapy

## 2015-12-18 ENCOUNTER — Ambulatory Visit: Payer: Medicare Other

## 2015-12-20 ENCOUNTER — Ambulatory Visit: Payer: Medicare Other | Admitting: Physical Therapy

## 2015-12-31 ENCOUNTER — Ambulatory Visit: Payer: Medicare Other | Attending: Internal Medicine

## 2016-01-09 ENCOUNTER — Ambulatory Visit: Payer: Medicare Other | Admitting: Physical Therapy

## 2016-01-21 ENCOUNTER — Encounter: Payer: Medicare Other | Admitting: Physical Therapy

## 2016-05-08 ENCOUNTER — Ambulatory Visit: Payer: Medicare Other | Attending: Internal Medicine

## 2016-07-01 ENCOUNTER — Ambulatory Visit: Payer: Medicare Other

## 2016-07-21 ENCOUNTER — Ambulatory Visit
Admission: RE | Admit: 2016-07-21 | Discharge: 2016-07-21 | Disposition: A | Discharge status: Home / Self Care | Payer: Medicare Other | Source: Ambulatory Visit | Attending: Internal Medicine | Admitting: Internal Medicine

## 2016-07-21 DIAGNOSIS — Z1231 Encounter for screening mammogram for malignant neoplasm of breast: Secondary | ICD-10-CM | POA: Insufficient documentation

## 2017-02-17 ENCOUNTER — Emergency Department
Admission: EM | Admit: 2017-02-17 | Discharge: 2017-02-18 | Disposition: A | Discharge status: Home / Self Care | Payer: Medicare Other | Attending: Emergency Medicine | Admitting: Emergency Medicine

## 2017-02-17 ENCOUNTER — Emergency Department: Payer: Medicare Other

## 2017-02-17 DIAGNOSIS — K209 Esophagitis, unspecified without bleeding: Secondary | ICD-10-CM

## 2017-02-17 DIAGNOSIS — E876 Hypokalemia: Secondary | ICD-10-CM | POA: Diagnosis not present

## 2017-02-17 DIAGNOSIS — R079 Chest pain, unspecified: Secondary | ICD-10-CM

## 2017-02-17 DIAGNOSIS — R51 Headache: Secondary | ICD-10-CM | POA: Insufficient documentation

## 2017-02-17 DIAGNOSIS — I10 Essential (primary) hypertension: Secondary | ICD-10-CM | POA: Diagnosis not present

## 2017-02-17 DIAGNOSIS — Z79899 Other long term (current) drug therapy: Secondary | ICD-10-CM | POA: Insufficient documentation

## 2017-02-17 DIAGNOSIS — R519 Headache, unspecified: Secondary | ICD-10-CM

## 2017-02-17 LAB — URINALYSIS, COMPLETE (UACMP) WITH MICROSCOPIC
BACTERIA UA: NONE SEEN
BILIRUBIN URINE: NEGATIVE
Glucose, UA: NEGATIVE mg/dL
KETONES UR: 20 mg/dL — AB
Leukocytes, UA: NEGATIVE
NITRITE: NEGATIVE
PROTEIN: NEGATIVE mg/dL
Specific Gravity, Urine: 1.013 (ref 1.005–1.030)
pH: 6 (ref 5.0–8.0)

## 2017-02-17 LAB — FIBRIN DERIVATIVES D-DIMER (ARMC ONLY): FIBRIN DERIVATIVES D-DIMER (ARMC): 497.05 (ref 0.00–499.00)

## 2017-02-17 LAB — CBC
HEMATOCRIT: 44.2 % (ref 35.0–47.0)
Hemoglobin: 14.9 g/dL (ref 12.0–16.0)
MCH: 26.8 pg (ref 26.0–34.0)
MCHC: 33.6 g/dL (ref 32.0–36.0)
MCV: 79.7 fL — AB (ref 80.0–100.0)
Platelets: 241 10*3/uL (ref 150–440)
RBC: 5.55 MIL/uL — ABNORMAL HIGH (ref 3.80–5.20)
RDW: 15.3 % — AB (ref 11.5–14.5)
WBC: 7.4 10*3/uL (ref 3.6–11.0)

## 2017-02-17 LAB — BASIC METABOLIC PANEL
Anion gap: 10 (ref 5–15)
BUN: 13 mg/dL (ref 6–20)
CHLORIDE: 101 mmol/L (ref 101–111)
CO2: 27 mmol/L (ref 22–32)
Calcium: 9.6 mg/dL (ref 8.9–10.3)
Creatinine, Ser: 0.82 mg/dL (ref 0.44–1.00)
GFR calc Af Amer: >60 mL/min (ref >60)
GFR calc non Af Amer: >60 mL/min (ref >60)
GLUCOSE: 106 mg/dL — AB (ref 65–99)
POTASSIUM: 3.1 mmol/L — AB (ref 3.5–5.1)
Sodium: 138 mmol/L (ref 135–145)

## 2017-02-17 LAB — TROPONIN I: Troponin I: <0.03 ng/mL (ref <0.03)

## 2017-02-17 MED ORDER — SODIUM CHLORIDE 0.9 % IV BOLUS (SEPSIS)
1000.0000 mL | Freq: Once | INTRAVENOUS | Status: AC
  Administered 2017-02-17: 1000 mL via INTRAVENOUS

## 2017-02-17 MED ORDER — METOCLOPRAMIDE HCL 5 MG/ML IJ SOLN
10.0000 mg | Freq: Once | INTRAMUSCULAR | Status: AC
  Administered 2017-02-17: 10 mg via INTRAVENOUS
  Filled 2017-02-17: qty 2

## 2017-02-17 NOTE — ED Triage Notes (Signed)
Patient to ED for headache and chest pain since yesterday. "Just doesn't feel right". Her PCP is "in the process of finding out if she has anything wrong with her heart" but hasn't told her anything yet. States central chest pain without radiation, no NVD. No diaphoresis. Patient states she feels winded when she walks to the bathroom or any short distance. No respiratory distress present at this time.

## 2017-02-17 NOTE — ED Provider Notes (Signed)
[facility] Emergency Department Provider Note ____________________________________________   First MD Initiated Contact with Patient 02/17/17 2145     (approximate)  I have reviewed the triage vital signs and the nursing notes.   HISTORY  Chief Complaint Headache and Chest Pain    HPI Jill English is a 51 y.o. female Past medical history as below who presents with headache for the last day, gradual onset, bilateral frontal, and associated with some nausea patient states he had similar headaches multiple times in the past and refers to them as migraines, and states in fact she has had significantly worse headaches but this one has not improved after she took her blood pressure medication or Excedrin. Patient also reports some crampy epigastric abdominal pain which is now resolved, some bilateral flank and lower back pain, and minimal chest discomfort which is also now resolved.  She reports shortness of breath, especially on exertion, for the last week.   Past Medical History:  Diagnosis Date  . Anxiety   . Anxiety   . Depression   . Diverticulitis Sept 2011   CT documented  . GERD (gastroesophageal reflux disease)   . Headache   . Hemorrhoids   . Hyperlipidemia   . Hypertension   . moderate obesity   . MS (multiple sclerosis) (HCC)   . Narcolepsy     Patient Active Problem List   Diagnosis Date Noted  . Irritable bowel syndrome (IBS) 01/05/2014  . Ovarian cyst, left 08/22/2013  . Epigastric pain 05/11/2012  . Diverticulitis 03/12/2011    Past Surgical History:  Procedure Laterality Date  . BACK SURGERY  2004  . carpal tunnel repair  2009  . COLONOSCOPY  04/07/11   ZOX:WRUEA internal hemorrhoids/diverticula in the sigmoid colon  . ESOPHAGOGASTRODUODENOSCOPY  04/07/11   SLF: no evidence of h pylori/gastric body and antral mucosa with minimal chronic inflammation, negative H.pylori  . HEMORRHOID SURGERY  07/21/2011   Procedure:  HEMORRHOIDECTOMY;  Surgeon: Dalia Heading, MD;  Location: [facility];  Service: General;  Laterality: N/A;  . PARTIAL HYSTERECTOMY  2008   with removal of rt ovary  . TUBAL LIGATION  1992    Prior to Admission medications   Medication Sig Start Date End Date Taking? Authorizing Provider  acetaminophen (TYLENOL) 650 MG CR tablet Take 650 mg by mouth as needed for pain.    [provider]  AMITIZA 24 MCG capsule TAKE 1 CAPSULE BY MOUTH TWICE DAILY WITH A MEAL. 01/10/15   Tiffany Kocher, PA-C  buPROPion (WELLBUTRIN) 100 MG tablet Take 100 mg by mouth daily.    [provider]  cetirizine (ZYRTEC) 10 MG tablet Take 10 mg by mouth daily. As needed    [provider]  cyclobenzaprine (FLEXERIL) 10 MG tablet Take 1 tablet (10 mg total) by mouth 3 (three) times daily as needed. 08/10/12   Triplett, Tammy, PA-C  desonide (DESOWEN) 0.05 % lotion Apply 1 application topically Twice daily as needed. Apply to scalp if a flare-up of alopecia 02/10/11   [provider]  fluticasone (FLONASE) 50 MCG/ACT nasal spray Place 2 sprays into the nose daily as needed. Sinuses    [provider]  HAWTHORN PO Take 1 tablet by mouth daily.    [provider]  hydrochlorothiazide (HYDRODIURIL) 25 MG tablet Take 25 mg by mouth daily.      [provider]  HYDROcodone-acetaminophen (NORCO/VICODIN) 5-325 MG per tablet Take one-two tabs po q 4-6 hrs prn pain  08/10/12   Triplett, Tammy, PA-C  ibuprofen (ADVIL,MOTRIN) 800 MG tablet Take 800 mg by mouth every 8 (eight) hours as needed. For pain      [provider]  metroNIDAZOLE (FLAGYL) 500 MG tablet Take 1 tablet (500 mg total) by mouth 2 (two) times daily. 08/10/12   Triplett, Tammy, PA-C  Omega-3 Fatty Acids (FISH OIL PO) Take 2 capsules by mouth at bedtime.    [provider]  OVER THE COUNTER MEDICATION Take 2 tablets by mouth 2 (two) times daily. G.I. Revive    [provider]    pantoprazole (PROTONIX) 40 MG tablet Take 1 tablet (40 mg total) by mouth daily. 30 minutes before first meal of day. 05/11/12   Gelene Mink, NP  REBIF 22 MCG/0.5ML injection Inject 22 mcg Sub-q 3 times weekly Rotate site after each injection 06/21/13   York Spaniel, MD  simvastatin (ZOCOR) 40 MG tablet Take 40 mg by mouth every evening.      [provider]  SUMAtriptan (IMITREX) 100 MG tablet Take 100 mg by mouth once. May repeat in 2 hours if headache persists or recurs.    [provider]    Allergies Gadolinium derivatives  Family History  Problem Relation Age of Onset  . Heart disease Father   . Diabetes Father   . Cancer Father        prostate  . Multiple sclerosis Sister   . Diabetes Sister   . Hyperlipidemia Sister   . Cancer Sister   . Cancer Mother        lung  . Hypertension Mother   . Hyperlipidemia Brother   . Other Brother        rare blood disorder  . Colon cancer Neg Hx   . Anesthesia problems Neg Hx   . Hypotension Neg Hx   . Malignant hyperthermia Neg Hx   . Pseudochol deficiency Neg Hx     Social History Social History  Substance Use Topics  . Smoking status: Never Smoker  . Smokeless tobacco: Never Used  . Alcohol use No    Review of Systems  Constitutional: No fever. Eyes: No visual changes. ENT: No sore throat. Cardiovascular: Positive for resolved chest pain. Respiratory: Positive for shortness of breath. Gastrointestinal: No vomiting.  Genitourinary: Negative for dysuria.  Musculoskeletal: Positive for back pain. Skin: Negative for rash. Neurological: Positive for headache.   ____________________________________________   PHYSICAL EXAM:  VITAL SIGNS: ED Triage Vitals [02/17/17 2133]  Enc Vitals Group     BP (!) 155/80     Pulse Rate (!) 110     Resp 20     Temp 98.1 F (36.7 C)     Temp Source Oral     SpO2 99 %     Weight 197 lb (89.4 kg)     Height  (1.6 m)     Head Circumference       Peak Flow      Pain Score 8     Pain Loc      Pain Edu?      Excl. in GC?     Constitutional: Alert and oriented. Well appearing and in no acute distress. Eyes: Conjunctivae are normal. EOMI.  PERRLA.  No photophobia.  Head: Atraumatic. Nose: No congestion/rhinnorhea. Mouth/Throat: Mucous membranes are moist.   Neck: Normal range of motion.  No meningeal signs.   Cardiovascular: Normal rate, regular rhythm. Grossly normal heart sounds.  Good peripheral circulation. Respiratory: Normal  respiratory effort.  No retractions. Lungs CTAB. Gastrointestinal: Soft and nontender. No distention.  Genitourinary: No CVA tenderness. Musculoskeletal: No lower extremity edema.  Extremities warm and well perfused.  Neurologic:  Normal speech and language. No gross focal neurologic deficits are appreciated.  Skin:  Skin is warm and dry. No rash noted. Psychiatric: Mood and affect are normal. Speech and behavior are normal.  ____________________________________________   LABS (all labs ordered are listed, but only abnormal results are displayed)  Labs Reviewed  BASIC METABOLIC PANEL - Abnormal; Notable for the following:       Result Value   Potassium 3.1 (*)    Glucose, Bld 106 (*)    All other components within normal limits  CBC - Abnormal; Notable for the following:    RBC 5.55 (*)    MCV 79.7 (*)    RDW 15.3 (*)    All other components within normal limits  URINALYSIS, COMPLETE (UACMP) WITH MICROSCOPIC - Abnormal; Notable for the following:    Color, Urine YELLOW (*)    APPearance CLEAR (*)    Hgb urine dipstick SMALL (*)    Ketones, ur 20 (*)    Squamous Epithelial / LPF 0-5 (*)    All other components within normal limits  TROPONIN I  FIBRIN DERIVATIVES D-DIMER ([facility] ONLY)   ____________________________________________  EKG  ED ECG REPORT I, Dionne Bucy, the attending physician, personally viewed and interpreted this ECG.  Date: 02/17/2017 EKG Time: 2149 Rate:  95 Rhythm: normal sinus rhythm QRS Axis: right axis deviation Intervals: normal ST/T Wave abnormalities: normal Narrative Interpretation: no evidence of acute ischemia  ____________________________________________  RADIOLOGY  Chest x-ray with no infiltrate or other acute findings.  ____________________________________________   PROCEDURES  Procedure(s) performed: No    Critical Care performed: No ____________________________________________   INITIAL IMPRESSION / ASSESSMENT AND PLAN / ED COURSE  Pertinent labs & imaging results that were available during my care of the patient were reviewed by me and considered in my medical decision making (see chart for details).  51 year old female with past medical history as noted presents with primary complaint of headache for the last day which is similar to prior headaches that she refers to his migraines, and reports multiple other symptoms the most significant of which is shortness of breath with exertion for the last week. Patient reports mild resolved chest pain, some resolved abdominal crampy pain, as well as lower back pain. She states her blood pressures been elevated for the last day despite her taking her blood pressure medications. Patient states she was previously noncompliant with her medications but in last 2 weeks has been taking her blood pressure medications as prescribed. On exam, patient has elevated blood pressure and borderline tachycardia with otherwise normal vital signs. Nonfocal neuro exam, and no other significant exam findings. No meningeal signs.    1. Headache: most consistent with migraine.  Pt has prior hx of similar, no fever or meningeal signs, and normal neuro exam.  No indication for imaging.  Plan for symptomatic tx with reglan and fluids.  2. Sob/chest pain: given pt's tachycardia and negative CXR, I am somewhat concerned for PE.  Pt is overall low risk but cannot be r/o by [facility].  Will obtain d-dimer.   Low susp for ACS.  Troponin is negative, and given duration of sx there is no indication for repeat or prolonged observation.  3. Hypertension: BP improved in ED, and no e/o end organ dysfunction.  No indication for further acute intervention.  4. Back pain: most likely msk.  Plan for UA to r/o UTI.    Anticipate d/c home if negative workup and symptoms improved.    ----------------------------------------- 12:09 AM on 02/18/2017 -----------------------------------------  Patient's UA, CBC, BMP and troponin are unremarkable except for low K.  CXR is clear.  D-dimer is on the high upper limit of normal, but pt is persistently tachycardic - so I am still somewhat suspicious for PE.  Discussed this with patient and she agrees to CT.  Will obtain CT chest to r/o PE, and likely d/c home if negative.  Headache improved.   ____________________________________________   FINAL CLINICAL IMPRESSION(S) / ED DIAGNOSES  Final diagnoses:  Acute nonintractable headache, unspecified headache type  Nonspecific chest pain      NEW MEDICATIONS STARTED DURING THIS VISIT:  New Prescriptions   No medications on file     Note:  This document was prepared using Dragon voice recognition software and may include unintentional dictation errors.    Dionne Bucy, MD 02/18/17 [phone]

## 2017-02-17 NOTE — ED Notes (Signed)
Patient transported to X-ray at this time 

## 2017-02-18 ENCOUNTER — Emergency Department: Payer: Medicare Other

## 2017-02-18 DIAGNOSIS — R51 Headache: Secondary | ICD-10-CM | POA: Diagnosis not present

## 2017-02-18 MED ORDER — HYDROCORTISONE NA SUCCINATE PF 100 MG IJ SOLR
200.0000 mg | Freq: Once | INTRAMUSCULAR | Status: AC
  Administered 2017-02-18: 200 mg via INTRAVENOUS
  Filled 2017-02-18: qty 4

## 2017-02-18 MED ORDER — SUCRALFATE 1 G PO TABS: 1.0000 g | ORAL_TABLET | Freq: Two times a day (BID) | ORAL | 0 refills | Status: AC

## 2017-02-18 MED ORDER — DIPHENHYDRAMINE HCL 50 MG/ML IJ SOLN
50.0000 mg | Freq: Once | INTRAMUSCULAR | Status: AC
  Administered 2017-02-18: 50 mg via INTRAVENOUS
  Filled 2017-02-18: qty 1

## 2017-02-18 MED ORDER — IOPAMIDOL (ISOVUE-370) INJECTION 76%
75.0000 mL | Freq: Once | INTRAVENOUS | Status: AC | PRN
  Administered 2017-02-18: 75 mL via INTRAVENOUS

## 2017-02-18 MED ORDER — POTASSIUM CHLORIDE ER 10 MEQ PO TBCR: 10.0000 meq | EXTENDED_RELEASE_TABLET | Freq: Two times a day (BID) | ORAL | 0 refills | Status: AC

## 2017-02-18 MED ORDER — POTASSIUM CHLORIDE CRYS ER 20 MEQ PO TBCR
20.0000 meq | EXTENDED_RELEASE_TABLET | Freq: Once | ORAL | Status: AC
  Administered 2017-02-18: 20 meq via ORAL
  Filled 2017-02-18: qty 1

## 2017-02-18 NOTE — ED Provider Notes (Signed)
-----------------------------------------   5:34 AM on 02/18/2017 -----------------------------------------   Blood pressure 129/75, pulse (!) 110, temperature 98.1 F (36.7 C), temperature source Oral, resp. rate 20, height 5\' 3"  (1.6 m), weight 89.4 kg (197 lb), SpO2 98 %.  Assuming care from Dr. Marisa Severin.  In short, Jill English is a 51 y.o. female with a chief complaint of Headache and Chest Pain .  Refer to the original H&P for additional details.  The current plan of care is to follow up the results of the patient's CT scan of her chest.  the patient has an allergy to gadolinium. I did contact the radiologist and they state that although the allergy to gadolinium is rare is not uncommon. They report though that the contrast allergy is more frequent and to be cautious that we should do the 4 hour pretreatment protocol prior to the patient receiving her CT scan.  Clinical Course as of Feb 18 533  Wed Feb 18, 2017  6803 the patient has no pulmonary embolus. She does have some distal esophageal thickening with a concern for reflux. I discussed the results with the patient and informed her that she would be discharged to follow-up with her primary care physician. She is comfortable at this time and agrees with the plans as stated.  [AW]    Clinical Course User Index [AW] Rebecka Apley, MD      Rebecka Apley, MD 02/18/17 [phone]

## 2017-06-10 ENCOUNTER — Other Ambulatory Visit: Payer: Self-pay | Admitting: Student

## 2017-06-10 DIAGNOSIS — R1013 Epigastric pain: Secondary | ICD-10-CM

## 2017-06-10 DIAGNOSIS — K30 Functional dyspepsia: Secondary | ICD-10-CM

## 2017-06-17 ENCOUNTER — Ambulatory Visit: Payer: Medicare Other

## 2017-06-29 ENCOUNTER — Other Ambulatory Visit: Payer: Self-pay | Admitting: Student

## 2017-06-29 ENCOUNTER — Ambulatory Visit
Admission: RE | Admit: 2017-06-29 | Discharge: 2017-06-29 | Disposition: A | Discharge status: Home / Self Care | Payer: Medicare Other | Source: Ambulatory Visit | Attending: Student | Admitting: Student

## 2017-06-29 DIAGNOSIS — K449 Diaphragmatic hernia without obstruction or gangrene: Secondary | ICD-10-CM | POA: Diagnosis not present

## 2017-06-29 DIAGNOSIS — R1013 Epigastric pain: Secondary | ICD-10-CM | POA: Insufficient documentation

## 2017-06-29 DIAGNOSIS — K30 Functional dyspepsia: Secondary | ICD-10-CM | POA: Insufficient documentation

## 2017-11-09 ENCOUNTER — Other Ambulatory Visit: Payer: Self-pay | Admitting: Internal Medicine

## 2017-11-09 ENCOUNTER — Other Ambulatory Visit: Payer: Self-pay | Admitting: Neurology

## 2017-11-09 DIAGNOSIS — G35 Multiple sclerosis: Secondary | ICD-10-CM

## 2017-11-09 DIAGNOSIS — Z1231 Encounter for screening mammogram for malignant neoplasm of breast: Secondary | ICD-10-CM

## 2017-11-23 ENCOUNTER — Ambulatory Visit
Admission: RE | Admit: 2017-11-23 | Discharge: 2017-11-23 | Disposition: A | Discharge status: Home / Self Care | Payer: Medicare Other | Source: Ambulatory Visit | Attending: Neurology | Admitting: Neurology

## 2017-11-23 ENCOUNTER — Encounter: Payer: Self-pay | Admitting: Radiology

## 2017-11-23 DIAGNOSIS — R9082 White matter disease, unspecified: Secondary | ICD-10-CM | POA: Diagnosis not present

## 2017-11-23 DIAGNOSIS — G35 Multiple sclerosis: Secondary | ICD-10-CM

## 2017-11-23 DIAGNOSIS — M50322 Other cervical disc degeneration at C5-C6 level: Secondary | ICD-10-CM | POA: Diagnosis not present

## 2017-12-14 ENCOUNTER — Ambulatory Visit
Admission: RE | Admit: 2017-12-14 | Discharge: 2017-12-14 | Disposition: A | Discharge status: Home / Self Care | Payer: Medicare Other | Source: Ambulatory Visit | Attending: Internal Medicine | Admitting: Internal Medicine

## 2017-12-14 DIAGNOSIS — Z1231 Encounter for screening mammogram for malignant neoplasm of breast: Secondary | ICD-10-CM | POA: Insufficient documentation

## 2018-03-04 ENCOUNTER — Other Ambulatory Visit: Payer: Self-pay | Admitting: Family

## 2018-03-04 DIAGNOSIS — N2889 Other specified disorders of kidney and ureter: Secondary | ICD-10-CM

## 2018-03-08 ENCOUNTER — Ambulatory Visit
Admission: RE | Admit: 2018-03-08 | Discharge: 2018-03-08 | Disposition: A | Discharge status: Home / Self Care | Payer: Medicare Other | Source: Ambulatory Visit | Attending: Family | Admitting: Family

## 2018-03-08 DIAGNOSIS — N2889 Other specified disorders of kidney and ureter: Secondary | ICD-10-CM | POA: Diagnosis present

## 2018-03-22 ENCOUNTER — Other Ambulatory Visit: Payer: Self-pay | Admitting: Internal Medicine

## 2018-03-22 DIAGNOSIS — R109 Unspecified abdominal pain: Secondary | ICD-10-CM

## 2018-03-24 ENCOUNTER — Ambulatory Visit: Payer: Medicare Other

## 2018-03-26 ENCOUNTER — Other Ambulatory Visit: Payer: Self-pay | Admitting: Student

## 2018-03-26 DIAGNOSIS — R1013 Epigastric pain: Secondary | ICD-10-CM

## 2018-04-05 ENCOUNTER — Ambulatory Visit
Admission: RE | Admit: 2018-04-05 | Discharge: 2018-04-05 | Disposition: A | Discharge status: Home / Self Care | Payer: Medicare Other | Source: Ambulatory Visit | Attending: Internal Medicine | Admitting: Internal Medicine

## 2018-04-05 DIAGNOSIS — R109 Unspecified abdominal pain: Secondary | ICD-10-CM | POA: Diagnosis present

## 2018-06-14 ENCOUNTER — Ambulatory Visit
Admission: RE | Admit: 2018-06-14 | Payer: Medicare Other | Source: Home / Self Care | Admitting: Unknown Physician Specialty

## 2018-06-14 ENCOUNTER — Encounter: Payer: Self-pay | Admitting: Anesthesiology

## 2018-06-14 ENCOUNTER — Encounter: Admission: RE | Payer: Self-pay | Source: Home / Self Care

## 2018-06-14 SURGERY — ESOPHAGOGASTRODUODENOSCOPY (EGD) WITH PROPOFOL
Anesthesia: General

## 2018-07-14 ENCOUNTER — Encounter: Admission: RE | Payer: Self-pay | Source: Home / Self Care

## 2018-07-14 ENCOUNTER — Ambulatory Visit
Admission: RE | Admit: 2018-07-14 | Payer: Medicare Other | Source: Home / Self Care | Admitting: Unknown Physician Specialty

## 2018-07-14 SURGERY — ESOPHAGOGASTRODUODENOSCOPY (EGD) WITH PROPOFOL
Anesthesia: General

## 2018-12-27 ENCOUNTER — Other Ambulatory Visit: Payer: Self-pay | Admitting: Family

## 2018-12-27 DIAGNOSIS — Z1231 Encounter for screening mammogram for malignant neoplasm of breast: Secondary | ICD-10-CM

## 2019-02-28 ENCOUNTER — Other Ambulatory Visit: Payer: Self-pay | Admitting: Neurology

## 2019-02-28 DIAGNOSIS — G35 Multiple sclerosis: Secondary | ICD-10-CM

## 2019-03-06 ENCOUNTER — Ambulatory Visit: Payer: Medicare Other

## 2019-03-20 ENCOUNTER — Other Ambulatory Visit: Payer: Self-pay

## 2019-03-20 ENCOUNTER — Ambulatory Visit
Admission: RE | Admit: 2019-03-20 | Discharge: 2019-03-20 | Disposition: A | Discharge status: Home / Self Care | Payer: Medicare Other | Source: Ambulatory Visit | Attending: Neurology | Admitting: Neurology

## 2019-03-20 DIAGNOSIS — G35 Multiple sclerosis: Secondary | ICD-10-CM | POA: Diagnosis not present

## 2019-08-31 ENCOUNTER — Encounter: Payer: Self-pay | Admitting: Emergency Medicine

## 2019-08-31 ENCOUNTER — Emergency Department
Admission: EM | Admit: 2019-08-31 | Discharge: 2019-08-31 | Disposition: A | Discharge status: Home / Self Care | Payer: Medicare Other | Attending: Emergency Medicine | Admitting: Emergency Medicine

## 2019-08-31 ENCOUNTER — Other Ambulatory Visit: Payer: Self-pay

## 2019-08-31 DIAGNOSIS — Z79899 Other long term (current) drug therapy: Secondary | ICD-10-CM | POA: Diagnosis not present

## 2019-08-31 DIAGNOSIS — G35 Multiple sclerosis: Secondary | ICD-10-CM | POA: Insufficient documentation

## 2019-08-31 DIAGNOSIS — I10 Essential (primary) hypertension: Secondary | ICD-10-CM | POA: Insufficient documentation

## 2019-08-31 DIAGNOSIS — K529 Noninfective gastroenteritis and colitis, unspecified: Secondary | ICD-10-CM | POA: Insufficient documentation

## 2019-08-31 DIAGNOSIS — R1010 Upper abdominal pain, unspecified: Secondary | ICD-10-CM | POA: Diagnosis present

## 2019-08-31 LAB — URINALYSIS, COMPLETE (UACMP) WITH MICROSCOPIC
Bilirubin Urine: NEGATIVE
Glucose, UA: NEGATIVE mg/dL
Hgb urine dipstick: NEGATIVE
Ketones, ur: NEGATIVE mg/dL
Leukocytes,Ua: NEGATIVE
Nitrite: NEGATIVE
Protein, ur: NEGATIVE mg/dL
Specific Gravity, Urine: 1.006 (ref 1.005–1.030)
pH: 7 (ref 5.0–8.0)

## 2019-08-31 LAB — COMPREHENSIVE METABOLIC PANEL
ALT: 18 U/L (ref 0–44)
AST: 19 U/L (ref 15–41)
Albumin: 4 g/dL (ref 3.5–5.0)
Alkaline Phosphatase: 81 U/L (ref 38–126)
Anion gap: 8 (ref 5–15)
BUN: 13 mg/dL (ref 6–20)
CO2: 26 mmol/L (ref 22–32)
Calcium: 8.6 mg/dL — ABNORMAL LOW (ref 8.9–10.3)
Chloride: 103 mmol/L (ref 98–111)
Creatinine, Ser: 0.75 mg/dL (ref 0.44–1.00)
GFR calc Af Amer: >60 mL/min (ref >60)
GFR calc non Af Amer: >60 mL/min (ref >60)
Glucose, Bld: 92 mg/dL (ref 70–99)
Potassium: 3.4 mmol/L — ABNORMAL LOW (ref 3.5–5.1)
Sodium: 137 mmol/L (ref 135–145)
Total Bilirubin: 0.8 mg/dL (ref 0.3–1.2)
Total Protein: 7.6 g/dL (ref 6.5–8.1)

## 2019-08-31 LAB — CBC
HCT: 42.9 % (ref 36.0–46.0)
Hemoglobin: 13.7 g/dL (ref 12.0–15.0)
MCH: 25.1 pg — ABNORMAL LOW (ref 26.0–34.0)
MCHC: 31.9 g/dL (ref 30.0–36.0)
MCV: 78.7 fL — ABNORMAL LOW (ref 80.0–100.0)
Platelets: 270 10*3/uL (ref 150–400)
RBC: 5.45 MIL/uL — ABNORMAL HIGH (ref 3.87–5.11)
RDW: 14.6 % (ref 11.5–15.5)
WBC: 4.1 10*3/uL (ref 4.0–10.5)
nRBC: 0 % (ref 0.0–0.2)

## 2019-08-31 LAB — LIPASE, BLOOD: Lipase: 22 U/L (ref 11–51)

## 2019-08-31 MED ORDER — DICYCLOMINE HCL 10 MG PO CAPS: 10.0000 mg | ORAL_CAPSULE | Freq: Three times a day (TID) | ORAL | 0 refills | Status: DC | PRN

## 2019-08-31 MED ORDER — SODIUM CHLORIDE 0.9% FLUSH: 3.0000 mL | Freq: Once | INTRAVENOUS | Status: DC

## 2019-08-31 MED ORDER — SODIUM CHLORIDE 0.9 % IV BOLUS
500.0000 mL | Freq: Once | INTRAVENOUS | Status: AC
  Administered 2019-08-31: 500 mL via INTRAVENOUS

## 2019-08-31 MED ORDER — DICYCLOMINE HCL 10 MG PO CAPS: 10.0000 mg | ORAL_CAPSULE | Freq: Four times a day (QID) | ORAL | 0 refills | Status: DC

## 2019-08-31 NOTE — ED Notes (Signed)
This RN to bedside due to patient calling out. Pt states she has to go to the bathroom. This RN unhooked patient to go to the bathroom and provided urine cup, explained to patient UA was needed at this time.

## 2019-08-31 NOTE — ED Notes (Signed)
Pt reports last dose Tylenol last night at approx 2130 for intermittent fevers Tmax at home 100.2.

## 2019-08-31 NOTE — Discharge Instructions (Signed)
Return to the ER for new, worsening, or persistent severe abdominal pain, vomiting, fever, blood in the stool, worsening swelling or pus drainage in the mouth, or any other new or worsening symptoms that concern you.

## 2019-08-31 NOTE — ED Notes (Signed)
Pt comfortable, VSS, temp WDL. No c/o abdominal pain at this time. No c/o vomiting, pt stated took peptobismol this AM. Pt is nauseous and reports multiple diarrhea episodes x2 days.  Call bell within reach. Pt denies further needs.

## 2019-08-31 NOTE — ED Notes (Signed)
Meds administered per MD order. Pt provided with 2 warm blankets. Pt denies further needs. Pt currently on phone speaking with family at this time. Pt A&O x4, NAD noted. Call bell remains within reach of patient at this time.

## 2019-08-31 NOTE — ED Provider Notes (Signed)
[facility] Emergency Department Provider Note ____________________________________________   First MD Initiated Contact with Patient 08/31/19 0848     (approximate)  I have reviewed the triage vital signs and the nursing notes.   HISTORY  Chief Complaint Abdominal Pain    HPI Jill English is a 54 y.o. female with PMH as noted below who presents with upper abdominal pain, intermittent course over the last few days, initially associated with vomiting which has now resolved, and associated with some diarrhea.  The patient denies any blood in the stool.  She states the symptoms started after she was at a relatives house and ate chicken that may have been contaminated.  She denies any fever chills, chest pain or shortness of breath, or any urinary symptoms.  She also reports that she has been on an antibiotic (she does not know which one) for dental infection for approximately last 3 weeks.  Past Medical History:  Diagnosis Date  . Anxiety   . Anxiety   . Depression   . Diverticulitis Sept 2011   CT documented  . GERD (gastroesophageal reflux disease)   . Headache   . Hemorrhoids   . Hyperlipidemia   . Hypertension   . moderate obesity   . MS (multiple sclerosis) (HCC)   . Narcolepsy     Patient Active Problem List   Diagnosis Date Noted  . Irritable bowel syndrome (IBS) 01/05/2014  . Ovarian cyst, left 08/22/2013  . Epigastric pain 05/11/2012  . Diverticulitis 03/12/2011    Past Surgical History:  Procedure Laterality Date  . ABDOMINAL HYSTERECTOMY    . BACK SURGERY  2004  . carpal tunnel repair  2009  . COLONOSCOPY  04/07/11   JEH:UDJSH internal hemorrhoids/diverticula in the sigmoid colon  . ESOPHAGOGASTRODUODENOSCOPY  04/07/11   SLF: no evidence of h pylori/gastric body and antral mucosa with minimal chronic inflammation, negative H.pylori  . HEMORRHOID SURGERY  07/21/2011   Procedure: HEMORRHOIDECTOMY;  Surgeon: Dalia Heading,  MD;  Location: [facility];  Service: General;  Laterality: N/A;  . PARTIAL HYSTERECTOMY  2008   with removal of rt ovary  . TUBAL LIGATION  1992    Prior to Admission medications   Medication Sig Start Date End Date Taking? Authorizing Provider  acetaminophen (TYLENOL) 650 MG CR tablet Take 650 mg by mouth as needed for pain.    [provider]  AMITIZA 24 MCG capsule TAKE 1 CAPSULE BY MOUTH TWICE DAILY WITH A MEAL. 01/10/15   Tiffany Kocher, PA-C  buPROPion (WELLBUTRIN) 100 MG tablet Take 100 mg by mouth daily.    [provider]  cetirizine (ZYRTEC) 10 MG tablet Take 10 mg by mouth daily. As needed    [provider]  cyclobenzaprine (FLEXERIL) 10 MG tablet Take 1 tablet (10 mg total) by mouth 3 (three) times daily as needed. 08/10/12   Triplett, Tammy, PA-C  desonide (DESOWEN) 0.05 % lotion Apply 1 application topically Twice daily as needed. Apply to scalp if a flare-up of alopecia 02/10/11   [provider]  dicyclomine (BENTYL) 10 MG capsule Take 1 capsule (10 mg total) by mouth 3 (three) times daily as needed for up to 5 days (abdominal pain/cramping). 08/31/19 09/05/19  Dionne Bucy, MD  fluticasone (FLONASE) 50 MCG/ACT nasal spray Place 2 sprays into the nose daily as needed. Sinuses    [provider]  HAWTHORN PO Take 1 tablet by mouth daily.    [provider]  hydrochlorothiazide (HYDRODIURIL)  25 MG tablet Take 25 mg by mouth daily.      [provider]  HYDROcodone-acetaminophen (NORCO/VICODIN) 5-325 MG per tablet Take one-two tabs po q 4-6 hrs prn pain 08/10/12   Triplett, Tammy, PA-C  ibuprofen (ADVIL,MOTRIN) 800 MG tablet Take 800 mg by mouth every 8 (eight) hours as needed. For pain      [provider]  metroNIDAZOLE (FLAGYL) 500 MG tablet Take 1 tablet (500 mg total) by mouth 2 (two) times daily. 08/10/12   Triplett, Tammy, PA-C  Omega-3 Fatty Acids (FISH OIL PO) Take 2 capsules by mouth at bedtime.     [provider]  OVER THE COUNTER MEDICATION Take 2 tablets by mouth 2 (two) times daily. G.I. Revive    [provider]  pantoprazole (PROTONIX) 40 MG tablet Take 1 tablet (40 mg total) by mouth daily. 30 minutes before first meal of day. 05/11/12   Gelene Mink, NP  potassium chloride (K-DUR) 10 MEQ tablet Take 1 tablet (10 mEq total) by mouth 2 (two) times daily. 02/18/17   Dionne Bucy, MD  REBIF 22 MCG/0.5ML injection Inject 22 mcg Sub-q 3 times weekly Rotate site after each injection 06/21/13   York Spaniel, MD  simvastatin (ZOCOR) 40 MG tablet Take 40 mg by mouth every evening.      [provider]  sucralfate (CARAFATE) 1 g tablet Take 1 tablet (1 g total) by mouth 2 (two) times daily. 02/18/17   Rebecka Apley, MD  SUMAtriptan (IMITREX) 100 MG tablet Take 100 mg by mouth once. May repeat in 2 hours if headache persists or recurs.    [provider]    Allergies Gadolinium derivatives  Family History  Problem Relation Age of Onset  . Heart disease Father   . Diabetes Father   . Cancer Father        prostate  . Multiple sclerosis Sister   . Diabetes Sister   . Hyperlipidemia Sister   . Cancer Sister   . Cancer Mother        lung  . Hypertension Mother   . Hyperlipidemia Brother   . Other Brother        rare blood disorder  . Colon cancer Neg Hx   . Anesthesia problems Neg Hx   . Hypotension Neg Hx   . Malignant hyperthermia Neg Hx   . Pseudochol deficiency Neg Hx     Social History Social History   Tobacco Use  . Smoking status: Never Smoker  . Smokeless tobacco: Never Used  Substance Use Topics  . Alcohol use: No  . Drug use: No    Review of Systems  Constitutional: No fever/chills Eyes: No visual changes. ENT: No sore throat. Cardiovascular: Denies chest pain. Respiratory: Denies shortness of breath. Gastrointestinal: Positive for diarrhea. Genitourinary: Negative for dysuria.  Musculoskeletal: Negative  for back pain. Skin: Negative for rash. Neurological: Negative for headache.   ____________________________________________   PHYSICAL EXAM:  VITAL SIGNS: ED Triage Vitals  Enc Vitals Group     BP 08/31/19 0837 (!) 121/59     Pulse Rate 08/31/19 0835 74     Resp 08/31/19 0835 16     Temp 08/31/19 0835 98.4 F (36.9 C)     Temp Source 08/31/19 0835 Oral     SpO2 08/31/19 0835 99 %     Weight 08/31/19 0833 190 lb (86.2 kg)     Height 08/31/19 0833 5\' 3"  (1.6 m)     Head  Circumference --      Peak Flow --      Pain Score 08/31/19 0833 0     Pain Loc --      Pain Edu? --      Excl. in GC? --     Constitutional: Alert and oriented. Well appearing and in no acute distress. Eyes: Conjunctivae are normal.  No scleral icterus. Head: Atraumatic. Nose: No congestion/rhinnorhea. Mouth/Throat: Mucous membranes are moist.   Neck: Normal range of motion.  Cardiovascular: Normal rate, regular rhythm.  Good peripheral circulation. Respiratory: Normal respiratory effort.  No retractions.  Gastrointestinal: Soft and nontender. No distention.  Genitourinary: No flank tenderness. Musculoskeletal:  Extremities warm and well perfused.  Neurologic:  Normal speech and language. No gross focal neurologic deficits are appreciated.  Skin:  Skin is warm and dry. No rash noted. Psychiatric: Mood and affect are normal. Speech and behavior are normal.  ____________________________________________   LABS (all labs ordered are listed, but only abnormal results are displayed)  Labs Reviewed  COMPREHENSIVE METABOLIC PANEL - Abnormal; Notable for the following components:      Result Value   Potassium 3.4 (*)    Calcium 8.6 (*)    All other components within normal limits  CBC - Abnormal; Notable for the following components:   RBC 5.45 (*)    MCV 78.7 (*)    MCH 25.1 (*)    All other components within normal limits  URINALYSIS, COMPLETE (UACMP) WITH MICROSCOPIC - Abnormal; Notable for the  following components:   Color, Urine YELLOW (*)    APPearance HAZY (*)    Bacteria, UA RARE (*)    All other components within normal limits  LIPASE, BLOOD   ____________________________________________  EKG  ED ECG REPORT I, Dionne Bucy, the attending physician, personally viewed and interpreted this ECG.  Date: 08/31/2019 EKG Time: 0856 Rate: 75 Rhythm: normal sinus rhythm QRS Axis: normal Intervals: normal ST/T Wave abnormalities: Nonspecific T wave inversions inferior Narrative Interpretation: Nonspecific abnormalities with no evidence of acute ischemia  ____________________________________________  RADIOLOGY    ____________________________________________   PROCEDURES  Procedure(s) performed: No  Procedures  Critical Care performed: No ____________________________________________   INITIAL IMPRESSION / ASSESSMENT AND PLAN / ED COURSE  Pertinent labs & imaging results that were available during my care of the patient were reviewed by me and considered in my medical decision making (see chart for details).  54 year old female with PMH as noted above presents with intermittent upper abdominal pain over the last 2 days associated with vomiting initially, and now with diarrhea.  She has been feeling somewhat weak.  She has been on an antibiotic for dental infection for a few weeks, and also reports that she had chicken a few days ago which may have been contaminated.  On exam, the patient is well-appearing.  Her vital signs are normal.  The abdomen is soft and nontender.  She is not having any pain currently.  Overall I suspect most likely viral gastroenteritis versus bacterial/foodborne illness, or gastritis.  We will obtain lab work-up and give a fluid bolus.  If the lab work-up is reassuring, I anticipate discharge home.  There is no indication for imaging.  ----------------------------------------- 11:35 AM on  08/31/2019 -----------------------------------------  Lab work-up was unremarkable.  The patient continued to appear well and had no further abdominal pain.  She is stable for discharge, and I have prescribed Bentyl for symptomatic treatment.  She also inquired about her dental abscesses.  She states that there  has been some drainage of pus, but the swelling has gone down while she has been on antibiotics.  I examined her mouth and gums, and there is no current fluctuance or drainable abscess.  I counseled the patient on the results of the work-up.  Return precautions given, and she expressed understanding. ____________________________________________   FINAL CLINICAL IMPRESSION(S) / ED DIAGNOSES  Final diagnoses:  Gastroenteritis      NEW MEDICATIONS STARTED DURING THIS VISIT:  Discharge Medication List as of 08/31/2019 11:34 AM    START taking these medications   Details  dicyclomine (BENTYL) 10 MG capsule Take 1 capsule (10 mg total) by mouth 4 (four) times daily for 14 days., Starting Wed 08/31/2019, Until Wed 09/14/2019, Normal         Note:  This document was prepared using Dragon voice recognition software and may include unintentional dictation errors.   Arta Silence, MD 08/31/19 1434

## 2019-08-31 NOTE — ED Notes (Signed)
Lily Peer, PA-student bedside.

## 2019-08-31 NOTE — ED Triage Notes (Signed)
Pt c/o upper abd pain with N/V/D for the past 2 days with a fever, pt is currently taking abx for infection of dental implants.

## 2019-09-19 ENCOUNTER — Other Ambulatory Visit: Payer: Self-pay | Admitting: Internal Medicine

## 2019-09-19 DIAGNOSIS — Z1231 Encounter for screening mammogram for malignant neoplasm of breast: Secondary | ICD-10-CM

## 2019-09-26 ENCOUNTER — Ambulatory Visit
Admission: RE | Admit: 2019-09-26 | Discharge: 2019-09-26 | Disposition: A | Discharge status: Home / Self Care | Payer: Medicare Other | Source: Ambulatory Visit | Attending: Internal Medicine | Admitting: Internal Medicine

## 2019-09-26 DIAGNOSIS — Z1231 Encounter for screening mammogram for malignant neoplasm of breast: Secondary | ICD-10-CM | POA: Diagnosis present

## 2019-11-01 ENCOUNTER — Emergency Department: Payer: Medicare Other

## 2019-11-01 ENCOUNTER — Emergency Department
Admission: EM | Admit: 2019-11-01 | Discharge: 2019-11-02 | Disposition: A | Discharge status: Other Acute Inpatient Hospital | Payer: Medicare Other | Attending: Emergency Medicine | Admitting: Emergency Medicine

## 2019-11-01 ENCOUNTER — Encounter: Payer: Self-pay | Admitting: Intensive Care

## 2019-11-01 ENCOUNTER — Other Ambulatory Visit: Payer: Self-pay

## 2019-11-01 DIAGNOSIS — M2761 Osseointegration failure of dental implant: Secondary | ICD-10-CM | POA: Insufficient documentation

## 2019-11-01 DIAGNOSIS — M272 Inflammatory conditions of jaws: Secondary | ICD-10-CM | POA: Diagnosis not present

## 2019-11-01 DIAGNOSIS — Z20822 Contact with and (suspected) exposure to covid-19: Secondary | ICD-10-CM | POA: Insufficient documentation

## 2019-11-01 DIAGNOSIS — K0889 Other specified disorders of teeth and supporting structures: Secondary | ICD-10-CM | POA: Diagnosis present

## 2019-11-01 LAB — CBC WITH DIFFERENTIAL/PLATELET
Abs Immature Granulocytes: 0.01 10*3/uL (ref 0.00–0.07)
Basophils Absolute: 0 10*3/uL (ref 0.0–0.1)
Basophils Relative: 0 %
Eosinophils Absolute: 0.1 10*3/uL (ref 0.0–0.5)
Eosinophils Relative: 2 %
HCT: 41.1 % (ref 36.0–46.0)
Hemoglobin: 13.2 g/dL (ref 12.0–15.0)
Immature Granulocytes: 0 %
Lymphocytes Relative: 44 %
Lymphs Abs: 2.8 10*3/uL (ref 0.7–4.0)
MCH: 25.7 pg — ABNORMAL LOW (ref 26.0–34.0)
MCHC: 32.1 g/dL (ref 30.0–36.0)
MCV: 80.1 fL (ref 80.0–100.0)
Monocytes Absolute: 0.5 10*3/uL (ref 0.1–1.0)
Monocytes Relative: 8 %
Neutro Abs: 3 10*3/uL (ref 1.7–7.7)
Neutrophils Relative %: 46 %
Platelets: 312 10*3/uL (ref 150–400)
RBC: 5.13 MIL/uL — ABNORMAL HIGH (ref 3.87–5.11)
RDW: 15.3 % (ref 11.5–15.5)
WBC: 6.4 10*3/uL (ref 4.0–10.5)
nRBC: 0 % (ref 0.0–0.2)

## 2019-11-01 LAB — COMPREHENSIVE METABOLIC PANEL
ALT: 14 U/L (ref 0–44)
AST: 16 U/L (ref 15–41)
Albumin: 4.1 g/dL (ref 3.5–5.0)
Alkaline Phosphatase: 84 U/L (ref 38–126)
Anion gap: 9 (ref 5–15)
BUN: 17 mg/dL (ref 6–20)
CO2: 27 mmol/L (ref 22–32)
Calcium: 9.3 mg/dL (ref 8.9–10.3)
Chloride: 106 mmol/L (ref 98–111)
Creatinine, Ser: 1.02 mg/dL — ABNORMAL HIGH (ref 0.44–1.00)
GFR calc Af Amer: >60 mL/min (ref >60)
GFR calc non Af Amer: >60 mL/min (ref >60)
Glucose, Bld: 119 mg/dL — ABNORMAL HIGH (ref 70–99)
Potassium: 3.7 mmol/L (ref 3.5–5.1)
Sodium: 142 mmol/L (ref 135–145)
Total Bilirubin: 0.6 mg/dL (ref 0.3–1.2)
Total Protein: 7.8 g/dL (ref 6.5–8.1)

## 2019-11-01 LAB — URINALYSIS, COMPLETE (UACMP) WITH MICROSCOPIC
Bacteria, UA: NONE SEEN
Glucose, UA: NEGATIVE mg/dL
Hgb urine dipstick: NEGATIVE
Ketones, ur: 5 mg/dL — AB
Leukocytes,Ua: NEGATIVE
Nitrite: NEGATIVE
Protein, ur: 30 mg/dL — AB
Specific Gravity, Urine: 1.035 — ABNORMAL HIGH (ref 1.005–1.030)
pH: 5 (ref 5.0–8.0)

## 2019-11-01 LAB — LACTIC ACID, PLASMA: Lactic Acid, Venous: 0.8 mmol/L (ref 0.5–1.9)

## 2019-11-01 MED ORDER — PIPERACILLIN-TAZOBACTAM 3.375 G IVPB 30 MIN
3.3750 g | Freq: Once | INTRAVENOUS | Status: AC
  Administered 2019-11-01: 3.375 g via INTRAVENOUS
  Filled 2019-11-01: qty 50

## 2019-11-01 MED ORDER — VANCOMYCIN HCL IN DEXTROSE 1-5 GM/200ML-% IV SOLN
1000.0000 mg | Freq: Once | INTRAVENOUS | Status: AC
  Administered 2019-11-02: 1000 mg via INTRAVENOUS
  Filled 2019-11-01: qty 200

## 2019-11-01 NOTE — ED Triage Notes (Signed)
Patient reports having dental implants April 11,2021 in [place] and had another surgery about a month lately. Had an implant removed yesterday due to infection at [facility] dental. Reports breaking out in sweats the past month. Reports she has taken many antibiotics.

## 2019-11-01 NOTE — ED Provider Notes (Signed)
[facility] Emergency Department Provider Note  ____________________________________________   First MD Initiated Contact with Patient 11/01/19 2305     (approximate)  I have reviewed the triage vital signs and the nursing notes.   HISTORY  Chief Complaint No chief complaint on file.    HPI Jill English is a 54 y.o. female with below list of previous medical conditions presents to the emergency department secondary to infected dental implants.  Patient states that she had dental implants placed in [place] on September 04, 2019 and then subsequently additional implants placed on Oct 06, 2019.  In total patient has had 17 implants placed.  Patient was seen by dentist yesterday at which point one implant was removed.  Patient states that she has been given clindamycin and subsequently Augmentin and amoxicillin with worsening infection per the dentist.  As such patient was referred to the emergency department for further management.  Patient's current pain score 6 out of 10.  Patient also admits to a foul taste in her mouth as well asked chills.  Denies any fever no difficulty breathing or swallowing.        Past Medical History:  Diagnosis Date  . Anxiety   . Anxiety   . Depression   . Diverticulitis Sept 2011   CT documented  . GERD (gastroesophageal reflux disease)   . Headache   . Hemorrhoids   . Hyperlipidemia   . Hypertension   . moderate obesity   . MS (multiple sclerosis) (HCC)   . Narcolepsy     Patient Active Problem List   Diagnosis Date Noted  . Irritable bowel syndrome (IBS) 01/05/2014  . Ovarian cyst, left 08/22/2013  . Epigastric pain 05/11/2012  . Diverticulitis 03/12/2011    Past Surgical History:  Procedure Laterality Date  . ABDOMINAL HYSTERECTOMY    . BACK SURGERY  2004  . carpal tunnel repair  2009  . COLONOSCOPY  04/07/11   GOT:LXBWI internal hemorrhoids/diverticula in the sigmoid colon  .  ESOPHAGOGASTRODUODENOSCOPY  04/07/11   SLF: no evidence of h pylori/gastric body and antral mucosa with minimal chronic inflammation, negative H.pylori  . HEMORRHOID SURGERY  07/21/2011   Procedure: HEMORRHOIDECTOMY;  Surgeon: Dalia Heading, MD;  Location: [facility];  Service: General;  Laterality: N/A;  . PARTIAL HYSTERECTOMY  2008   with removal of rt ovary  . TUBAL LIGATION  1992    Prior to Admission medications   Medication Sig Start Date End Date Taking? Authorizing Provider  acetaminophen (TYLENOL) 650 MG CR tablet Take 650 mg by mouth as needed for pain.    [provider]  AMITIZA 24 MCG capsule TAKE 1 CAPSULE BY MOUTH TWICE DAILY WITH A MEAL. 01/10/15   Tiffany Kocher, PA-C  buPROPion (WELLBUTRIN) 100 MG tablet Take 100 mg by mouth daily.    [provider]  cetirizine (ZYRTEC) 10 MG tablet Take 10 mg by mouth daily. As needed    [provider]  cyclobenzaprine (FLEXERIL) 10 MG tablet Take 1 tablet (10 mg total) by mouth 3 (three) times daily as needed. 08/10/12   Triplett, Tammy, PA-C  desonide (DESOWEN) 0.05 % lotion Apply 1 application topically Twice daily as needed. Apply to scalp if a flare-up of alopecia 02/10/11   [provider]  dicyclomine (BENTYL) 10 MG capsule Take 1 capsule (10 mg total) by mouth 3 (three) times daily as needed for up to 5 days (abdominal pain/cramping). 08/31/19 09/05/19  Dionne Bucy, MD  fluticasone (  FLONASE) 50 MCG/ACT nasal spray Place 2 sprays into the nose daily as needed. Sinuses    [provider]  HAWTHORN PO Take 1 tablet by mouth daily.    [provider]  hydrochlorothiazide (HYDRODIURIL) 25 MG tablet Take 25 mg by mouth daily.      [provider]  HYDROcodone-acetaminophen (NORCO/VICODIN) 5-325 MG per tablet Take one-two tabs po q 4-6 hrs prn pain 08/10/12   Triplett, Tammy, PA-C  ibuprofen (ADVIL,MOTRIN) 800 MG tablet Take 800 mg by mouth every 8 (eight) hours as needed. For  pain      [provider]  metroNIDAZOLE (FLAGYL) 500 MG tablet Take 1 tablet (500 mg total) by mouth 2 (two) times daily. 08/10/12   Triplett, Tammy, PA-C  Omega-3 Fatty Acids (FISH OIL PO) Take 2 capsules by mouth at bedtime.    [provider]  OVER THE COUNTER MEDICATION Take 2 tablets by mouth 2 (two) times daily. G.I. Revive    [provider]  pantoprazole (PROTONIX) 40 MG tablet Take 1 tablet (40 mg total) by mouth daily. 30 minutes before first meal of day. 05/11/12   Gelene Mink, NP  potassium chloride (K-DUR) 10 MEQ tablet Take 1 tablet (10 mEq total) by mouth 2 (two) times daily. 02/18/17   Dionne Bucy, MD  REBIF 22 MCG/0.5ML injection Inject 22 mcg Sub-q 3 times weekly Rotate site after each injection 06/21/13   York Spaniel, MD  simvastatin (ZOCOR) 40 MG tablet Take 40 mg by mouth every evening.      [provider]  sucralfate (CARAFATE) 1 g tablet Take 1 tablet (1 g total) by mouth 2 (two) times daily. 02/18/17   Rebecka Apley, MD  SUMAtriptan (IMITREX) 100 MG tablet Take 100 mg by mouth once. May repeat in 2 hours if headache persists or recurs.    [provider]    Allergies Latex and Gadolinium derivatives  Family History  Problem Relation Age of Onset  . Heart disease Father   . Diabetes Father   . Cancer Father        prostate  . Multiple sclerosis Sister   . Diabetes Sister   . Hyperlipidemia Sister   . Cancer Sister   . Cancer Mother        lung  . Hypertension Mother   . Hyperlipidemia Brother   . Other Brother        rare blood disorder  . Breast cancer Paternal Aunt   . Colon cancer Neg Hx   . Anesthesia problems Neg Hx   . Hypotension Neg Hx   . Malignant hyperthermia Neg Hx   . Pseudochol deficiency Neg Hx     Social History Social History   Tobacco Use  . Smoking status: Never Smoker  . Smokeless tobacco: Never Used  Substance Use Topics  . Alcohol use: No  . Drug use: No     Review of Systems Constitutional: No fever/chills Eyes: No visual changes. ENT: No sore throat.  Positive for oral pain Cardiovascular: Denies chest pain. Respiratory: Denies shortness of breath. Gastrointestinal: No abdominal pain.  No nausea, no vomiting.  No diarrhea.  No constipation. Genitourinary: Negative for dysuria. Musculoskeletal: Negative for neck pain.  Negative for back pain. Integumentary: Negative for rash. Neurological: Negative for headaches, focal weakness or numbness.   ____________________________________________   PHYSICAL EXAM:  VITAL SIGNS: ED Triage Vitals  Enc Vitals Group     BP 11/01/19 1746 (!) 155/82  Pulse Rate 11/01/19 1746 (!) 104     Resp 11/01/19 1746 16     Temp 11/01/19 1746 100.1 F (37.8 C)     Temp Source 11/01/19 1746 Oral     SpO2 11/01/19 1746 98 %     Weight 11/01/19 1747 83.9 kg (185 lb)     Height 11/01/19 1747 1.6 m (5\' 3" )     Head Circumference --      Peak Flow --      Pain Score 11/01/19 1747 6     Pain Loc --      Pain Edu? --      Excl. in GC? --     Constitutional: Alert and oriented.  Eyes: Conjunctivae are normal.  Head: Atraumatic. Nose: No congestion/rhinnorhea. Mouth/Throat: Dentulous, considerable gum erythema along the right mandibular and right maxillary gumline. Neck: No stridor.  No meningeal signs.   Cardiovascular: Normal rate, regular rhythm. Good peripheral circulation. Grossly normal heart sounds. Respiratory: Normal respiratory effort.  No retractions. Gastrointestinal: Soft and nontender. No distention.   Musculoskeletal: No lower extremity tenderness nor edema. No gross deformities of extremities. Neurologic:  Normal speech and language. No gross focal neurologic deficits are appreciated.  Skin:  Skin is warm, dry and intact. Psychiatric: Mood and affect are normal. Speech and behavior are normal.  ____________________________________________   LABS (all labs ordered are listed, but  only abnormal results are displayed)  Labs Reviewed  COMPREHENSIVE METABOLIC PANEL - Abnormal; Notable for the following components:      Result Value   Glucose, Bld 119 (*)    Creatinine, Ser 1.02 (*)    All other components within normal limits  CBC WITH DIFFERENTIAL/PLATELET - Abnormal; Notable for the following components:   RBC 5.13 (*)    MCH 25.7 (*)    All other components within normal limits  URINALYSIS, COMPLETE (UACMP) WITH MICROSCOPIC - Abnormal; Notable for the following components:   Color, Urine AMBER (*)    APPearance HAZY (*)    Specific Gravity, Urine 1.035 (*)    Bilirubin Urine SMALL (*)    Ketones, ur 5 (*)    Protein, ur 30 (*)    All other components within normal limits  LACTIC ACID, PLASMA     RADIOLOGY I, Caroline N Darrin Koman, personally viewed and evaluated these images (plain radiographs) as part of my medical decision making, as well as reviewing the written report by the radiologist.  ED MD interpretation: Negative chest x-ray per radiologist  Official radiology report(s): DG Chest 2 View  Result Date: 11/01/2019 CLINICAL DATA:  Infection. Recent dental work in 01/01/2020, subsequent infection. EXAM: CHEST - 2 VIEW COMPARISON:  02/17/2017 FINDINGS: The cardiomediastinal contours are normal. The lungs are clear. Pulmonary vasculature is normal. No consolidation, pleural effusion, or pneumothorax. No acute osseous abnormalities are seen. IMPRESSION: Negative radiographs of the chest. Electronically Signed   By: 02/19/2017 M.D.   On: 11/01/2019 18:35      Procedures   ____________________________________________   INITIAL IMPRESSION / MDM / ASSESSMENT AND PLAN / ED COURSE  As part of my medical decision making, I reviewed the following data within the electronic MEDICAL RECORD NUMBER  54 year old female presented with above-stated history and physical exam a differential diagnosis including but not limited to soft tissue use oral infection,  osteomyelitis, oral abscess,.  Patient has no clinical findings consistent with Ludwig's angina.  CT maxillofacial was performed that revealed findings concerning for osteomyelitis of the maxilla.  Patient was  given IV vancomycin 1 g and Zosyn 3.375 mg..  Patient discussed with Dr. Janace Hoard ENT at [facility] as I was informed that there was no one on call for oral maxillofacial surgery.  Dr. Janace Hoard states that this is beyond his scope.  As such patient was subsequently discussed with Dr. Patrina Levering for maxillofacial surgery at [facility].  Patient was accepted by Dr. Patrina Levering ____________________________________________  FINAL CLINICAL IMPRESSION(S) / ED DIAGNOSES  Final diagnoses:  Acute osteomyelitis of maxilla     MEDICATIONS GIVEN DURING THIS VISIT:  Medications - No data to display   ED Discharge Orders    None      *Please note:  Janae Bonser was evaluated in Emergency Department on 11/01/2019 for the symptoms described in the history of present illness. She was evaluated in the context of the global COVID-19 pandemic, which necessitated consideration that the patient might be at risk for infection with the SARS-CoV-2 virus that causes COVID-19. Institutional protocols and algorithms that pertain to the evaluation of patients at risk for COVID-19 are in a state of rapid change based on information released by regulatory bodies including the CDC and federal and state organizations. These policies and algorithms were followed during the patient's care in the ED.  Some ED evaluations and interventions may be delayed as a result of limited staffing during the pandemic.*  Note:  This document was prepared using Dragon voice recognition software and may include unintentional dictation errors.   Gregor Hams, MD 11/02/19 [phone]

## 2019-11-01 NOTE — ED Notes (Signed)
Pt states multiple surgeries. Pt states several months ago she went to [place] to get dental implants of all teeth. Pt states she has been on antibiotics and have had to get teeth removed due to infections , but the pain is bad.

## 2019-11-01 NOTE — ED Triage Notes (Signed)
Called for triage no answer  

## 2019-11-01 NOTE — ED Triage Notes (Signed)
First nurse note- went to [place] to have dental implants.  First procedure 4/11 and then another procedure 5/13.  Went to her dentist yesterday. Has been on abx. Dentist sent to ED r/t infection because too much for them to do.

## 2019-11-02 ENCOUNTER — Ambulatory Visit (HOSPITAL_COMMUNITY)
Admission: AD | Admit: 2019-11-02 | Discharge: 2019-11-02 | Disposition: A | Discharge status: Home / Self Care | Payer: Medicare Other | Source: Other Acute Inpatient Hospital | Attending: Emergency Medicine | Admitting: Emergency Medicine

## 2019-11-02 DIAGNOSIS — M869 Osteomyelitis, unspecified: Secondary | ICD-10-CM | POA: Insufficient documentation

## 2019-11-02 DIAGNOSIS — M272 Inflammatory conditions of jaws: Secondary | ICD-10-CM | POA: Diagnosis not present

## 2019-11-02 LAB — SARS CORONAVIRUS 2 BY RT PCR (HOSPITAL ORDER, PERFORMED IN ~~LOC~~ HOSPITAL LAB): SARS Coronavirus 2: NEGATIVE

## 2019-11-02 MED ORDER — IBUPROFEN 600 MG PO TABS
600.0000 mg | ORAL_TABLET | Freq: Once | ORAL | Status: AC
  Administered 2019-11-02: 600 mg via ORAL
  Filled 2019-11-02: qty 1

## 2019-11-02 NOTE — ED Notes (Signed)
Before first antibiotic given, first antibiotic was given, as order for blood cultures had not been placed. Provider notified and he stated just to get blood cultures still.

## 2019-11-02 NOTE — ED Notes (Signed)
Pt laying in bed. Pt states pain 5/10 and states she does not need any other medication for pain.

## 2019-11-02 NOTE — ED Notes (Signed)
Pt is being transferred to [facility]. Pt is going to bed 7124. Clinical cytogeneticist was [employer]. Report given to Diamond Nickel, RN and Dorene Sorrow with [facility] air care. [facility] to transfer though and separate report given to April.

## 2019-11-02 NOTE — ED Notes (Signed)
[facility] here to transport to [facility] 

## 2019-11-02 NOTE — ED Notes (Signed)
[facility] left with pt 

## 2019-11-07 LAB — CULTURE, BLOOD (ROUTINE X 2)
Culture: NO GROWTH
Culture: NO GROWTH
Special Requests: ADEQUATE

## 2019-12-15 ENCOUNTER — Ambulatory Visit (INDEPENDENT_AMBULATORY_CARE_PROVIDER_SITE_OTHER): Payer: Medicare Other | Admitting: Plastic Surgery

## 2019-12-15 ENCOUNTER — Encounter: Payer: Self-pay | Admitting: Plastic Surgery

## 2019-12-15 ENCOUNTER — Other Ambulatory Visit: Payer: Self-pay

## 2019-12-15 VITALS — BP 147/88 | HR 82 | Ht 63.0 in | Wt 193.6 lb

## 2019-12-15 DIAGNOSIS — M546 Pain in thoracic spine: Secondary | ICD-10-CM

## 2019-12-15 DIAGNOSIS — N62 Hypertrophy of breast: Secondary | ICD-10-CM | POA: Diagnosis not present

## 2019-12-15 DIAGNOSIS — M4004 Postural kyphosis, thoracic region: Secondary | ICD-10-CM

## 2019-12-15 DIAGNOSIS — M545 Low back pain, unspecified: Secondary | ICD-10-CM

## 2019-12-15 NOTE — Progress Notes (Signed)
Referring Provider Margaretann Loveless, MD [address] [place],  [place] [address]   CC:  Chief Complaint  Patient presents with  . Consult      Jill English is an 54 y.o. female.  HPI: Patient is here to discuss breast reduction.  She is had years of back pain, neck pain and shoulder grooving that are due to her large breasts.  She gets intermittent skin and itchy irritation beneath her breast that have been refractory to conservative measures.  Her pain has been refractory to over-the-counter medications, warm packs and cold packs and supportive bras.  She is currently a double D and wants to be about a C.  She is up-to-date on her mammograms and has never had a suspicious finding.  She has been to physical physical therapy in the past for her back pain with little relief.  She does not smoke and is not diabetic.  Allergies  Allergen Reactions  . Contrast Media [Iodinated Diagnostic Agents]   . Latex   . Gadolinium Derivatives Itching and Rash    Patient had received contrast before without any adverse effects. About 15 minutes after injection patient began to itch and showed signs of facial rash/swelling for several minutes. Radiologist examined and cleared patient, but suggested premedication for any future contrast injections.     Outpatient Encounter Medications as of 12/15/2019  Medication Sig  . acetaminophen (TYLENOL) 650 MG CR tablet Take 650 mg by mouth as needed for pain.  Marland Kitchen AMITIZA 24 MCG capsule TAKE 1 CAPSULE BY MOUTH TWICE DAILY WITH A MEAL.  Marland Kitchen buPROPion (WELLBUTRIN) 100 MG tablet Take 100 mg by mouth daily.  . cetirizine (ZYRTEC) 10 MG tablet Take 10 mg by mouth daily. As needed  . cyclobenzaprine (FLEXERIL) 10 MG tablet Take 1 tablet (10 mg total) by mouth 3 (three) times daily as needed.  . desonide (DESOWEN) 0.05 % lotion Apply 1 application topically Twice daily as needed. Apply to scalp if a flare-up of alopecia  . fluticasone (FLONASE) 50 MCG/ACT nasal  spray Place 2 sprays into the nose daily as needed. Sinuses  . HAWTHORN PO Take 1 tablet by mouth daily.  . hydrochlorothiazide (HYDRODIURIL) 25 MG tablet Take 25 mg by mouth daily.    Marland Kitchen ibuprofen (ADVIL,MOTRIN) 800 MG tablet Take 800 mg by mouth every 8 (eight) hours as needed. For pain    . metroNIDAZOLE (FLAGYL) 500 MG tablet Take 1 tablet (500 mg total) by mouth 2 (two) times daily.  . Omega-3 Fatty Acids (FISH OIL PO) Take 2 capsules by mouth at bedtime.  Marland Kitchen OVER THE COUNTER MEDICATION Take 2 tablets by mouth 2 (two) times daily. G.I. Revive  . pantoprazole (PROTONIX) 40 MG tablet Take 1 tablet (40 mg total) by mouth daily. 30 minutes before first meal of day.  . potassium chloride (K-DUR) 10 MEQ tablet Take 1 tablet (10 mEq total) by mouth 2 (two) times daily.  Marland Kitchen REBIF 22 MCG/0.5ML injection Inject 22 mcg Sub-q 3 times weekly Rotate site after each injection  . simvastatin (ZOCOR) 40 MG tablet Take 40 mg by mouth every evening.    . sucralfate (CARAFATE) 1 g tablet Take 1 tablet (1 g total) by mouth 2 (two) times daily.  . SUMAtriptan (IMITREX) 100 MG tablet Take 100 mg by mouth once. May repeat in 2 hours if headache persists or recurs.  . dicyclomine (BENTYL) 10 MG capsule Take 1 capsule (10 mg total) by mouth 3 (three) times daily as  needed for up to 5 days (abdominal pain/cramping).  Marland Kitchen HYDROcodone-acetaminophen (NORCO/VICODIN) 5-325 MG per tablet Take one-two tabs po q 4-6 hrs prn pain (Patient not taking: Reported on 12/15/2019)   No facility-administered encounter medications on file as of 12/15/2019.     Past Medical History:  Diagnosis Date  . Anxiety   . Anxiety   . Depression   . Diverticulitis Sept 2011   CT documented  . GERD (gastroesophageal reflux disease)   . Headache   . Hemorrhoids   . Hyperlipidemia   . Hypertension   . moderate obesity   . MS (multiple sclerosis) (HCC)   . Narcolepsy     Past Surgical History:  Procedure Laterality Date  . ABDOMINAL  HYSTERECTOMY    . BACK SURGERY  2004  . carpal tunnel repair  2009  . COLONOSCOPY  04/07/11   DPO:EUMPN internal hemorrhoids/diverticula in the sigmoid colon  . ESOPHAGOGASTRODUODENOSCOPY  04/07/11   SLF: no evidence of h pylori/gastric body and antral mucosa with minimal chronic inflammation, negative H.pylori  . HEMORRHOID SURGERY  07/21/2011   Procedure: HEMORRHOIDECTOMY;  Surgeon: Dalia Heading, MD;  Location: [facility];  Service: General;  Laterality: N/A;  . PARTIAL HYSTERECTOMY  2008   with removal of rt ovary  . TUBAL LIGATION  1992    Family History  Problem Relation Age of Onset  . Heart disease Father   . Diabetes Father   . Cancer Father        prostate  . Multiple sclerosis Sister   . Diabetes Sister   . Hyperlipidemia Sister   . Cancer Sister   . Cancer Mother        lung  . Hypertension Mother   . Hyperlipidemia Brother   . Other Brother        rare blood disorder  . Breast cancer Paternal Aunt   . Colon cancer Neg Hx   . Anesthesia problems Neg Hx   . Hypotension Neg Hx   . Malignant hyperthermia Neg Hx   . Pseudochol deficiency Neg Hx     Social History   Social History Narrative   [name]   Patient has two children.   Patient has a college education.        Denies tobacco use  Review of Systems General: Denies fevers, chills, weight loss CV: Denies chest pain, shortness of breath, palpitations  Physical Exam Vitals with BMI 12/15/2019 11/02/2019 11/02/2019  Height 5\' 3"  - -  Weight 193 lbs 10 oz - -  BMI 34.3 - -  Systolic 147 140  Diastolic 88 94 72  Pulse 82 76 77    General:  No acute distress,  Alert and oriented, Non-Toxic, Normal speech and affect Breast: She has grade 2 ptosis.  Sternal notch to nipple is 30 cm bilaterally.  Nipple to fold is 12 cm bilaterally.  I do not see any obvious scars or masses.  She does have shoulder grooving on exam  Assessment/Plan The patient has bilateral  symptomatic macromastia.  She is a good candidate for a breast reduction.  She is interested in pursuing surgical treatment.  The details of breast reduction surgery were discussed.  I explained the procedure in detail along the with the expected scars.  The risks were discussed in detail and include bleeding, infection, damage to surrounding structures, need for additional procedures, nipple loss, change in nipple sensation, persistent pain, contour irregularities and asymmetries.  I explained that breast feeding is often not possible after breast reduction surgery.  We discussed the expected postoperative course with an overall recovery period of about 1 month.  She demonstrated full understanding of all risks.  We discussed her personal risk factors.  I anticipate approximately 600 g of tissue removed from each side.   Allena Napoleon 12/15/2019, 1:01 PM

## 2019-12-24 ENCOUNTER — Emergency Department
Admission: EM | Admit: 2019-12-24 | Discharge: 2019-12-25 | Disposition: A | Discharge status: Home / Self Care | Payer: No Typology Code available for payment source | Attending: Emergency Medicine | Admitting: Emergency Medicine

## 2019-12-24 ENCOUNTER — Other Ambulatory Visit: Payer: Self-pay | Admitting: Radiology

## 2019-12-24 ENCOUNTER — Emergency Department: Payer: No Typology Code available for payment source

## 2019-12-24 ENCOUNTER — Other Ambulatory Visit: Payer: Self-pay

## 2019-12-24 DIAGNOSIS — M25572 Pain in left ankle and joints of left foot: Secondary | ICD-10-CM | POA: Diagnosis not present

## 2019-12-24 DIAGNOSIS — Y999 Unspecified external cause status: Secondary | ICD-10-CM | POA: Insufficient documentation

## 2019-12-24 DIAGNOSIS — M25532 Pain in left wrist: Secondary | ICD-10-CM | POA: Diagnosis not present

## 2019-12-24 DIAGNOSIS — Y939 Activity, unspecified: Secondary | ICD-10-CM | POA: Diagnosis not present

## 2019-12-24 DIAGNOSIS — G8929 Other chronic pain: Secondary | ICD-10-CM | POA: Diagnosis not present

## 2019-12-24 DIAGNOSIS — Z79899 Other long term (current) drug therapy: Secondary | ICD-10-CM | POA: Diagnosis not present

## 2019-12-24 DIAGNOSIS — Z91040 Latex allergy status: Secondary | ICD-10-CM | POA: Diagnosis not present

## 2019-12-24 DIAGNOSIS — Y929 Unspecified place or not applicable: Secondary | ICD-10-CM | POA: Diagnosis not present

## 2019-12-24 DIAGNOSIS — M542 Cervicalgia: Secondary | ICD-10-CM | POA: Diagnosis present

## 2019-12-24 DIAGNOSIS — I10 Essential (primary) hypertension: Secondary | ICD-10-CM | POA: Diagnosis not present
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
## 2019-12-25 ENCOUNTER — Emergency Department: Payer: No Typology Code available for payment source

## 2019-12-25 ENCOUNTER — Other Ambulatory Visit: Payer: Medicare Other

## 2019-12-25 DIAGNOSIS — M542 Cervicalgia: Secondary | ICD-10-CM | POA: Diagnosis not present

## 2019-12-25 MED ORDER — CYCLOBENZAPRINE HCL 10 MG PO TABS: 10.0000 mg | ORAL_TABLET | Freq: Three times a day (TID) | ORAL | 0 refills | Status: AC | PRN

## 2019-12-25 MED ORDER — CYCLOBENZAPRINE HCL 10 MG PO TABS
10.0000 mg | ORAL_TABLET | Freq: Once | ORAL | Status: AC
  Administered 2019-12-25: 10 mg via ORAL
  Filled 2019-12-25: qty 1

## 2019-12-25 MED ORDER — IBUPROFEN 800 MG PO TABS
800.0000 mg | ORAL_TABLET | Freq: Once | ORAL | Status: AC
  Administered 2019-12-25: 800 mg via ORAL
  Filled 2019-12-25: qty 1

## 2019-12-25 MED ORDER — IBUPROFEN 800 MG PO TABS
800.0000 mg | ORAL_TABLET | Freq: Three times a day (TID) | ORAL | 0 refills | Status: DC | PRN
Start: 2019-12-25 — End: 2020-05-20
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
## 2019-12-26 ENCOUNTER — Other Ambulatory Visit: Payer: Self-pay

## 2019-12-26 ENCOUNTER — Encounter: Payer: Self-pay | Admitting: Emergency Medicine

## 2019-12-26 ENCOUNTER — Emergency Department: Payer: No Typology Code available for payment source

## 2019-12-26 ENCOUNTER — Emergency Department
Admission: EM | Admit: 2019-12-26 | Discharge: 2019-12-26 | Disposition: A | Discharge status: Home / Self Care | Payer: No Typology Code available for payment source | Attending: Emergency Medicine | Admitting: Emergency Medicine

## 2019-12-26 DIAGNOSIS — M62830 Muscle spasm of back: Secondary | ICD-10-CM | POA: Diagnosis not present

## 2019-12-26 DIAGNOSIS — Z91040 Latex allergy status: Secondary | ICD-10-CM | POA: Insufficient documentation

## 2019-12-26 DIAGNOSIS — I10 Essential (primary) hypertension: Secondary | ICD-10-CM | POA: Diagnosis not present

## 2019-12-26 DIAGNOSIS — Z79899 Other long term (current) drug therapy: Secondary | ICD-10-CM | POA: Insufficient documentation

## 2019-12-26 DIAGNOSIS — M62838 Other muscle spasm: Secondary | ICD-10-CM

## 2019-12-26 LAB — URINALYSIS, COMPLETE (UACMP) WITH MICROSCOPIC
Bacteria, UA: NONE SEEN
Bilirubin Urine: NEGATIVE
Glucose, UA: NEGATIVE mg/dL
Hgb urine dipstick: NEGATIVE
Ketones, ur: NEGATIVE mg/dL
Leukocytes,Ua: NEGATIVE
Nitrite: NEGATIVE
Protein, ur: NEGATIVE mg/dL
Specific Gravity, Urine: 1.018 (ref 1.005–1.030)
pH: 8 (ref 5.0–8.0)
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
## 2019-12-26 NOTE — Discharge Instructions (Signed)
Take the medication that was prescribed to you the other day as needed. Apply ice to any areas that hurt. Wet heat followed by ice in 2 days. Return if worsening Follow-up with your regular doctor if not improving in 7 to 10 days.

## 2020-01-06 ENCOUNTER — Other Ambulatory Visit: Payer: Medicare Other | Attending: Gastroenterology

## 2020-01-20 ENCOUNTER — Other Ambulatory Visit: Admission: RE | Admit: 2020-01-20 | Payer: Medicare Other | Source: Ambulatory Visit

## 2020-01-23 ENCOUNTER — Ambulatory Visit: Admission: RE | Admit: 2020-01-23 | Payer: Medicare Other | Source: Home / Self Care

## 2020-01-23 ENCOUNTER — Encounter: Admission: RE | Payer: Self-pay | Source: Home / Self Care

## 2020-01-23 SURGERY — EGD (ESOPHAGOGASTRODUODENOSCOPY)
Anesthesia: General

## 2020-01-24 ENCOUNTER — Ambulatory Visit: Admission: RE | Admit: 2020-01-24 | Payer: Medicare Other | Source: Home / Self Care

## 2020-01-24 ENCOUNTER — Encounter: Admission: RE | Payer: Self-pay | Source: Home / Self Care

## 2020-01-24 SURGERY — ESOPHAGOGASTRODUODENOSCOPY (EGD) WITH PROPOFOL
Anesthesia: General

## 2020-02-07 ENCOUNTER — Other Ambulatory Visit: Payer: Self-pay | Admitting: Orthopedic Surgery

## 2020-02-07 ENCOUNTER — Ambulatory Visit
Admission: RE | Admit: 2020-02-07 | Discharge: 2020-02-07 | Disposition: A | Discharge status: Home / Self Care | Payer: Medicare Other | Source: Ambulatory Visit | Attending: Orthopedic Surgery | Admitting: Orthopedic Surgery

## 2020-02-07 DIAGNOSIS — M7581 Other shoulder lesions, right shoulder: Secondary | ICD-10-CM

## 2020-02-07 DIAGNOSIS — S63501A Unspecified sprain of right wrist, initial encounter: Secondary | ICD-10-CM

## 2020-02-17 ENCOUNTER — Other Ambulatory Visit: Payer: Medicare Other | Attending: Gastroenterology

## 2020-02-17 NOTE — Progress Notes (Deleted)
ICD-10-CM   1. Macromastia  N62   2. Back pain of thoracolumbar region  M54.5    M54.6       Patient ID: Jill English, female    DOB: [birth year], 54 y.o.   MRN: [id number]   History of Present Illness: Jill English is a 54 y.o.  female  with a history of macromastia.  She presents for preoperative evaluation for upcoming procedure, bilateral breast reduction, scheduled for 03/20/2020 with Dr. Arita Miss.  Summary from previous visit: Patient has bilateral symptomatic macromastia.  She has grade 2 ptosis.  Sternal notch to nipple distance is 30 cm bilaterally.  Nipple to IMF is 12 cm bilaterally.  No obvious scars or masses.  Patient is currently a DD and would like to be a C cup. The estimated excess breast tissue to be removed at time of surgery is 600 g from each side.  Job: ***  PMH Significant for: Anxiety, depression, HTN, HLD, MS, narcolepsy, latex allergy.  The patient {HAS HAS [id number]} had problems with anesthesia. ***  Past Medical History: Allergies: Allergies  Allergen Reactions  . Contrast Media [Iodinated Diagnostic Agents]   . Latex   . Gadolinium Derivatives Itching and Rash    Patient had received contrast before without any adverse effects. About 15 minutes after injection patient began to itch and showed signs of facial rash/swelling for several minutes. Radiologist examined and cleared patient, but suggested premedication for any future contrast injections.     Current Medications:  Current Outpatient Medications:  .  acetaminophen (TYLENOL) 650 MG CR tablet, Take 650 mg by mouth as needed for pain., Disp: , Rfl:  .  AMITIZA 24 MCG capsule, TAKE 1 CAPSULE BY MOUTH TWICE DAILY WITH A MEAL., Disp: 60 capsule, Rfl: 11 .  buPROPion (WELLBUTRIN) 100 MG tablet, Take 100 mg by mouth daily., Disp: , Rfl:  .  cetirizine (ZYRTEC) 10 MG tablet, Take 10 mg by mouth daily. As needed, Disp: , Rfl:  .  cyclobenzaprine (FLEXERIL) 10 MG tablet, Take 1 tablet  (10 mg total) by mouth 3 (three) times daily as needed for muscle spasms., Disp: 30 tablet, Rfl: 0 .  desonide (DESOWEN) 0.05 % lotion, Apply 1 application topically Twice daily as needed. Apply to scalp if a flare-up of alopecia, Disp: , Rfl:  .  dicyclomine (BENTYL) 10 MG capsule, Take 1 capsule (10 mg total) by mouth 3 (three) times daily as needed for up to 5 days (abdominal pain/cramping)., Disp: 10 capsule, Rfl: 0 .  fluticasone (FLONASE) 50 MCG/ACT nasal spray, Place 2 sprays into the nose daily as needed. Sinuses, Disp: , Rfl:  .  HAWTHORN PO, Take 1 tablet by mouth daily., Disp: , Rfl:  .  hydrochlorothiazide (HYDRODIURIL) 25 MG tablet, Take 25 mg by mouth daily.  , Disp: , Rfl:  .  ibuprofen (ADVIL) 800 MG tablet, Take 1 tablet (800 mg total) by mouth every 8 (eight) hours as needed., Disp: 30 tablet, Rfl: 0 .  Omega-3 Fatty Acids (FISH OIL PO), Take 2 capsules by mouth at bedtime., Disp: , Rfl:  .  OVER THE COUNTER MEDICATION, Take 2 tablets by mouth 2 (two) times daily. G.I. Revive, Disp: , Rfl:  .  pantoprazole (PROTONIX) 40 MG tablet, Take 1 tablet (40 mg total) by mouth daily. 30 minutes before first meal of day., Disp: 30 tablet, Rfl: 3 .  potassium chloride (K-DUR) 10 MEQ tablet, Take 1 tablet (10 mEq total) by mouth 2 (  two) times daily., Disp: 20 tablet, Rfl: 0 .  REBIF 22 MCG/0.5ML injection, Inject 22 mcg Sub-q 3 times weekly Rotate site after each injection, Disp: 6 mL, Rfl: 1 .  simvastatin (ZOCOR) 40 MG tablet, Take 40 mg by mouth every evening.  , Disp: , Rfl:  .  sucralfate (CARAFATE) 1 g tablet, Take 1 tablet (1 g total) by mouth 2 (two) times daily., Disp: 20 tablet, Rfl: 0 .  SUMAtriptan (IMITREX) 100 MG tablet, Take 100 mg by mouth once. May repeat in 2 hours if headache persists or recurs., Disp: , Rfl:   Past Medical Problems: Past Medical History:  Diagnosis Date  . Anxiety   . Anxiety   . Depression   . Diverticulitis Sept 2011   CT documented  . GERD  (gastroesophageal reflux disease)   . Headache   . Hemorrhoids   . Hyperlipidemia   . Hypertension   . moderate obesity   . MS (multiple sclerosis) (HCC)   . Narcolepsy     Past Surgical History: Past Surgical History:  Procedure Laterality Date  . ABDOMINAL HYSTERECTOMY    . BACK SURGERY  2004  . carpal tunnel repair  2009  . COLONOSCOPY  04/07/11   [id number] internal hemorrhoids/diverticula in the sigmoid colon  . ESOPHAGOGASTRODUODENOSCOPY  04/07/11   SLF: no evidence of h pylori/gastric body and antral mucosa with minimal chronic inflammation, negative H.pylori  . HEMORRHOID SURGERY  07/21/2011   Procedure: HEMORRHOIDECTOMY;  Surgeon: Dalia Heading, MD;  Location: [facility];  Service: General;  Laterality: N/A;  . PARTIAL HYSTERECTOMY  2008   with removal of rt ovary  . TUBAL LIGATION  1992    Social History: Social History   Socioeconomic History  . Marital status: Legally Separated    Spouse name: [name]  . Number of children: 2  . Years of education: college  . Highest education level: Not on file  Occupational History    Employer: NOT EMPLOYED  Tobacco Use  . Smoking status: Never Smoker  . Smokeless tobacco: Never Used  Substance and Sexual Activity  . Alcohol use: No  . Drug use: No  . Sexual activity: Yes  Other Topics Concern  . Not on file  Social History Narrative   [name]   Patient has two children.   Patient has a college education.         Social Determinants of Health   Financial Resource Strain:   . Difficulty of Paying Living Expenses: Not on file  Food Insecurity:   . Worried About Programme researcher, broadcasting/film/video in the Last Year: Not on file  . Ran Out of Food in the Last Year: Not on file  Transportation Needs:   . Lack of Transportation (Medical): Not on file  . Lack of Transportation (Non-Medical): Not on file  Physical Activity:   . Days of Exercise per Week: Not on file  . Minutes of Exercise  per Session: Not on file  Stress:   . Feeling of Stress : Not on file  Social Connections:   . Frequency of Communication with Friends and Family: Not on file  . Frequency of Social Gatherings with Friends and Family: Not on file  . Attends Religious Services: Not on file  . Active Member of Clubs or Organizations: Not on file  . Attends Banker Meetings: Not on file  . Marital Status: Not on file  Intimate Partner Violence:   .  Fear of Current or Ex-Partner: Not on file  . Emotionally Abused: Not on file  . Physically Abused: Not on file  . Sexually Abused: Not on file    Family History: Family History  Problem Relation Age of Onset  . Heart disease Father   . Diabetes Father   . Cancer Father        prostate  . Multiple sclerosis Sister   . Diabetes Sister   . Hyperlipidemia Sister   . Cancer Sister   . Cancer Mother        lung  . Hypertension Mother   . Hyperlipidemia Brother   . Other Brother        rare blood disorder  . Breast cancer Paternal Aunt   . Colon cancer Neg Hx   . Anesthesia problems Neg Hx   . Hypotension Neg Hx   . Malignant hyperthermia Neg Hx   . Pseudochol deficiency Neg Hx     Review of Systems: ROS  Physical Exam: Vital Signs There were no vitals taken for this visit. Physical Exam  Assessment/Plan:  Ms. Carra scheduled for bilateral breast reduction with Dr. Arita Miss.  Risks, benefits, and alternatives of procedure discussed, questions answered and consent obtained.    Smoking Status: ***; Counseling Given? *** Last Mammogram: 09/26/2019; Results: No findings suspicious for malignancy  Caprini Score: ***; Risk Factors include: 54 year old female, BMI *** 25, and length of planned surgery. Recommendation for mechanical *** pharmacological prophylaxis during surgery. Encourage early ambulation.   Pictures obtained: 12/15/2019  Post-op Rx sent to pharmacy: *** Patient instructed to take no other IBU or Tylenol products while  taking those from this office.  Patient was provided with the breast reduction risks and General Surgical Risk consent document and Pain Medication Agreement prior to their appointment.  They had adequate time to read through the risk consent documents and Pain Medication Agreement. We also discussed them in person together during this preop appointment. All of their questions were answered to their satisfaction.  Recommended calling if they have any further questions.  Risk consent form and Pain Medication Agreement to be scanned into patient's chart.    Electronically signed by: Eldridge Abrahams, PA-C 02/17/2020 12:53 PM

## 2020-02-20 ENCOUNTER — Encounter: Payer: Medicare Other | Admitting: Plastic Surgery

## 2020-02-21 ENCOUNTER — Ambulatory Visit: Admission: RE | Admit: 2020-02-21 | Payer: Medicare Other | Source: Home / Self Care

## 2020-02-21 ENCOUNTER — Encounter: Admission: RE | Payer: Self-pay | Source: Home / Self Care

## 2020-02-21 SURGERY — COLONOSCOPY WITH PROPOFOL
Anesthesia: General

## 2020-02-27 NOTE — Progress Notes (Deleted)
ICD-10-CM   1. Macromastia  N62   2. Back pain of thoracolumbar region  M54.50    M54.6   3. Postural kyphosis, thoracic region  M40.04       Patient ID: Jill English, female    DOB: 04/09/1966, 54 y.o.   MRN: [id number]   History of Present Illness: Jill English is a 54 y.o.  female  with a history of macromastia.  She presents for preoperative evaluation for upcoming procedure, bilateral breast reduction, scheduled for 03/20/2020 with Dr. Arita Miss.  Summary from previous visit: Patient has had years of back pain, neck pain, and shoulder grooving that are due to her large breasts.  She is currently a DD and wants to be about a C cup.  She has grade 2 ptosis.  Sternal notch to nipple distance is 30 cm bilaterally.  Nipple to IMF is 12 cm bilaterally.  No obvious scars or masses.  The anticipated excess breast tissue to be removed at time of surgery is 600 g from each side.  Job: ***  PMH Significant for: Anxiety, depression, HA, GERD, HLD, HTN, MS, narcolepsy, latex allergy  The patient {HAS HAS [id number]} had problems with anesthesia. ***  Past Medical History: Allergies: Allergies  Allergen Reactions  . Contrast Media [Iodinated Diagnostic Agents]   . Latex   . Gadolinium Derivatives Itching and Rash    Patient had received contrast before without any adverse effects. About 15 minutes after injection patient began to itch and showed signs of facial rash/swelling for several minutes. Radiologist examined and cleared patient, but suggested premedication for any future contrast injections.     Current Medications:  Current Outpatient Medications:  .  acetaminophen (TYLENOL) 650 MG CR tablet, Take 650 mg by mouth as needed for pain., Disp: , Rfl:  .  AMITIZA 24 MCG capsule, TAKE 1 CAPSULE BY MOUTH TWICE DAILY WITH A MEAL., Disp: 60 capsule, Rfl: 11 .  buPROPion (WELLBUTRIN) 100 MG tablet, Take 100 mg by mouth daily., Disp: , Rfl:  .  cetirizine (ZYRTEC) 10 MG  tablet, Take 10 mg by mouth daily. As needed, Disp: , Rfl:  .  cyclobenzaprine (FLEXERIL) 10 MG tablet, Take 1 tablet (10 mg total) by mouth 3 (three) times daily as needed for muscle spasms., Disp: 30 tablet, Rfl: 0 .  desonide (DESOWEN) 0.05 % lotion, Apply 1 application topically Twice daily as needed. Apply to scalp if a flare-up of alopecia, Disp: , Rfl:  .  dicyclomine (BENTYL) 10 MG capsule, Take 1 capsule (10 mg total) by mouth 3 (three) times daily as needed for up to 5 days (abdominal pain/cramping)., Disp: 10 capsule, Rfl: 0 .  fluticasone (FLONASE) 50 MCG/ACT nasal spray, Place 2 sprays into the nose daily as needed. Sinuses, Disp: , Rfl:  .  HAWTHORN PO, Take 1 tablet by mouth daily., Disp: , Rfl:  .  hydrochlorothiazide (HYDRODIURIL) 25 MG tablet, Take 25 mg by mouth daily.  , Disp: , Rfl:  .  ibuprofen (ADVIL) 800 MG tablet, Take 1 tablet (800 mg total) by mouth every 8 (eight) hours as needed., Disp: 30 tablet, Rfl: 0 .  Omega-3 Fatty Acids (FISH OIL PO), Take 2 capsules by mouth at bedtime., Disp: , Rfl:  .  OVER THE COUNTER MEDICATION, Take 2 tablets by mouth 2 (two) times daily. G.I. Revive, Disp: , Rfl:  .  pantoprazole (PROTONIX) 40 MG tablet, Take 1 tablet (40 mg total) by mouth daily. 30 minutes before  first meal of day., Disp: 30 tablet, Rfl: 3 .  potassium chloride (K-DUR) 10 MEQ tablet, Take 1 tablet (10 mEq total) by mouth 2 (two) times daily., Disp: 20 tablet, Rfl: 0 .  REBIF 22 MCG/0.5ML injection, Inject 22 mcg Sub-q 3 times weekly Rotate site after each injection, Disp: 6 mL, Rfl: 1 .  simvastatin (ZOCOR) 40 MG tablet, Take 40 mg by mouth every evening.  , Disp: , Rfl:  .  sucralfate (CARAFATE) 1 g tablet, Take 1 tablet (1 g total) by mouth 2 (two) times daily., Disp: 20 tablet, Rfl: 0 .  SUMAtriptan (IMITREX) 100 MG tablet, Take 100 mg by mouth once. May repeat in 2 hours if headache persists or recurs., Disp: , Rfl:   Past Medical Problems: Past Medical History:    Diagnosis Date  . Anxiety   . Anxiety   . Depression   . Diverticulitis Sept 2011   CT documented  . GERD (gastroesophageal reflux disease)   . Headache   . Hemorrhoids   . Hyperlipidemia   . Hypertension   . moderate obesity   . MS (multiple sclerosis) (HCC)   . Narcolepsy     Past Surgical History: Past Surgical History:  Procedure Laterality Date  . ABDOMINAL HYSTERECTOMY    . BACK SURGERY  2004  . carpal tunnel repair  2009  . COLONOSCOPY  04/07/11   ULA:GTXMI internal hemorrhoids/diverticula in the sigmoid colon  . ESOPHAGOGASTRODUODENOSCOPY  04/07/11   SLF: no evidence of h pylori/gastric body and antral mucosa with minimal chronic inflammation, negative H.pylori  . HEMORRHOID SURGERY  07/21/2011   Procedure: HEMORRHOIDECTOMY;  Surgeon: Dalia Heading, MD;  Location: [facility];  Service: General;  Laterality: N/A;  . PARTIAL HYSTERECTOMY  2008   with removal of rt ovary  . TUBAL LIGATION  1992    Social History: Social History   Socioeconomic History  . Marital status: Legally Separated    Spouse name: [name]  . Number of children: 2  . Years of education: college  . Highest education level: Not on file  Occupational History    Employer: NOT EMPLOYED  Tobacco Use  . Smoking status: Never Smoker  . Smokeless tobacco: Never Used  Substance and Sexual Activity  . Alcohol use: No  . Drug use: No  . Sexual activity: Yes  Other Topics Concern  . Not on file  Social History Narrative   [name]   Patient has two children.   Patient has a college education.         Social Determinants of Health   Financial Resource Strain:   . Difficulty of Paying Living Expenses: Not on file  Food Insecurity:   . Worried About Programme researcher, broadcasting/film/video in the Last Year: Not on file  . Ran Out of Food in the Last Year: Not on file  Transportation Needs:   . Lack of Transportation (Medical): Not on file  . Lack of Transportation  (Non-Medical): Not on file  Physical Activity:   . Days of Exercise per Week: Not on file  . Minutes of Exercise per Session: Not on file  Stress:   . Feeling of Stress : Not on file  Social Connections:   . Frequency of Communication with Friends and Family: Not on file  . Frequency of Social Gatherings with Friends and Family: Not on file  . Attends Religious Services: Not on file  . Active Member of Clubs  or Organizations: Not on file  . Attends Banker Meetings: Not on file  . Marital Status: Not on file  Intimate Partner Violence:   . Fear of Current or Ex-Partner: Not on file  . Emotionally Abused: Not on file  . Physically Abused: Not on file  . Sexually Abused: Not on file    Family History: Family History  Problem Relation Age of Onset  . Heart disease Father   . Diabetes Father   . Cancer Father        prostate  . Multiple sclerosis Sister   . Diabetes Sister   . Hyperlipidemia Sister   . Cancer Sister   . Cancer Mother        lung  . Hypertension Mother   . Hyperlipidemia Brother   . Other Brother        rare blood disorder  . Breast cancer Paternal Aunt   . Colon cancer Neg Hx   . Anesthesia problems Neg Hx   . Hypotension Neg Hx   . Malignant hyperthermia Neg Hx   . Pseudochol deficiency Neg Hx     Review of Systems: ROS  Physical Exam: Vital Signs There were no vitals taken for this visit. Physical Exam  Assessment/Plan:  Ms. Jeangilles scheduled for bilateral breast reduction with Dr. Arita Miss.  Risks, benefits, and alternatives of procedure discussed, questions answered and consent obtained.    Smoking Status: ***; Counseling Given? *** Last Mammogram: 09/26/2019; Results: No findings suspicious of malignancy  Caprini Score: ***; Risk Factors include: 54 year old female, BMI *** 25, and length of planned surgery. Recommendation for mechanical *** pharmacological prophylaxis during surgery. Encourage early ambulation.   Pictures  obtained: 12/15/2019  Post-op Rx sent to pharmacy: ***  Patient was provided with the breast reduction risks and General Surgical Risk consent document and Pain Medication Agreement prior to their appointment.  They had adequate time to read through the risk consent documents and Pain Medication Agreement. We also discussed them in person together during this preop appointment. All of their questions were answered to their satisfaction.  Recommended calling if they have any further questions.  Risk consent form and Pain Medication Agreement to be scanned into patient's chart.  The risk that can be encountered with breast reduction were discussed and include the following but not limited to these:  Breast asymmetry, fluid accumulation, firmness of the breast, inability to breast feed, loss of nipple or areola, skin loss, decrease or no nipple sensation, fat necrosis of the breast tissue, bleeding, infection, healing delay.  There are risks of anesthesia, changes to skin sensation and injury to nerves or blood vessels.  The muscle can be temporarily or permanently injured.  You may have an allergic reaction to tape, suture, glue, blood products which can result in skin discoloration, swelling, pain, skin lesions, poor healing.  Any of these can lead to the need for revisonal surgery or stage procedures.  A reduction has potential to interfere with diagnostic procedures.  Nipple or breast piercing can increase risks of infection.  This procedure is best done when the breast is fully developed.  Changes in the breast will continue to occur over time.  Pregnancy can alter the outcomes of previous breast reduction surgery, weight gain and weigh loss can also effect the long term appearance.   The 21st Century Cures Act was signed into law in 2016 which includes the topic of electronic health records.  This provides immediate access to information in MyChart.  This includes consultation notes, operative notes,  office notes, lab results and pathology reports.  If you have any questions about what you read please let us know at your next visit or call us at the office.  We are right here with you.   Electronically signed by: Eldridge Abrahams, PA-C 02/27/2020 8:01 PM

## 2020-03-01 ENCOUNTER — Encounter: Payer: Medicare Other | Admitting: Plastic Surgery

## 2020-03-15 ENCOUNTER — Other Ambulatory Visit (HOSPITAL_COMMUNITY): Payer: Medicare Other

## 2020-03-16 ENCOUNTER — Other Ambulatory Visit (HOSPITAL_COMMUNITY): Payer: Medicare Other

## 2020-03-28 ENCOUNTER — Encounter: Payer: Medicare Other | Admitting: Plastic Surgery

## 2020-03-29 ENCOUNTER — Encounter: Payer: Medicare Other | Admitting: Plastic Surgery

## 2020-04-11 ENCOUNTER — Encounter: Payer: Medicare Other | Admitting: Plastic Surgery

## 2020-04-21 ENCOUNTER — Other Ambulatory Visit (HOSPITAL_COMMUNITY): Payer: Medicare Other

## 2020-05-02 ENCOUNTER — Encounter: Payer: Medicare Other | Admitting: Plastic Surgery

## 2020-05-04 ENCOUNTER — Encounter: Admission: RE | Payer: Self-pay | Source: Home / Self Care

## 2020-05-04 ENCOUNTER — Ambulatory Visit: Admission: RE | Admit: 2020-05-04 | Payer: Medicare Other | Source: Home / Self Care

## 2020-05-04 SURGERY — COLONOSCOPY WITH PROPOFOL
Anesthesia: General

## 2020-05-10 ENCOUNTER — Encounter: Payer: Medicare Other | Admitting: Plastic Surgery

## 2020-05-20 NOTE — Progress Notes (Signed)
ICD-10-CM   1. Macromastia  N62   2. Back pain of thoracolumbar region  M54.50    M54.6   3. Postural kyphosis, thoracic region  M40.04       Patient ID: Jill English, female    DOB: January 25, 1966, 54 y.o.   MRN: [id number]   History of Present Illness: Jill English is a 54 y.o.  female  with a history of macromastia.  She presents for preoperative evaluation for upcoming procedure, bilateral breast reduction, scheduled for 06/05/2020 with Dr. Arita Miss.  Summary from previous visit: Patient has had back pain neck pain and shoulder grooving due to her large breasts.  She is currently a DD and wants to be around a C cup.  She has grade 2 ptosis.  Sternal notch to nipple distance is 30 cm bilaterally.  Nipple to IMF is 12 cm bilaterally.  No obvious scars or masses.  The estimated excess breast tissue to be removed at time of surgery is 600 g from each side.  Patient's right nipple is slightly inverted. Reports she discussed this previously with Dr. Arita Miss. Would like this corrected if possible.  [id number]  PMH Significant for: Anxiety/depression, HA, HLD, HTN, multiple sclerosis, Sleep Apnea - uses CPAP Latex Allergy  The patient has had problems with anesthesia. Reports once during a colonoscopy she was told she stopped breathing for a moment.   Past Medical History: Allergies: Allergies  Allergen Reactions  . Contrast Media [Iodinated Diagnostic Agents]   . Latex   . Gadolinium Derivatives Itching and Rash    Patient had received contrast before without any adverse effects. About 15 minutes after injection patient began to itch and showed signs of facial rash/swelling for several minutes. Radiologist examined and cleared patient, but suggested premedication for any future contrast injections.     Current Medications:  Current Outpatient Medications:  .  AMITIZA 24 MCG capsule, TAKE 1 CAPSULE BY MOUTH TWICE DAILY WITH A MEAL., Disp: 60 capsule, Rfl: 11 .   cetirizine (ZYRTEC) 10 MG tablet, Take 10 mg by mouth daily. As needed, Disp: , Rfl:  .  cyclobenzaprine (FLEXERIL) 10 MG tablet, Take 1 tablet (10 mg total) by mouth 3 (three) times daily as needed for muscle spasms., Disp: 30 tablet, Rfl: 0 .  desonide (DESOWEN) 0.05 % lotion, Apply 1 application topically Twice daily as needed. Apply to scalp if a flare-up of alopecia, Disp: , Rfl:  .  fluticasone (FLONASE) 50 MCG/ACT nasal spray, Place 2 sprays into the nose daily as needed. Sinuses, Disp: , Rfl:  .  HAWTHORN PO, Take 1 tablet by mouth daily., Disp: , Rfl:  .  hydrochlorothiazide (HYDRODIURIL) 25 MG tablet, Take 25 mg by mouth daily., Disp: , Rfl:  .  Omega-3 Fatty Acids (FISH OIL PO), Take 2 capsules by mouth at bedtime., Disp: , Rfl:  .  OVER THE COUNTER MEDICATION, Take 2 tablets by mouth 2 (two) times daily. G.I. Revive, Disp: , Rfl:  .  pantoprazole (PROTONIX) 40 MG tablet, Take 1 tablet (40 mg total) by mouth daily. 30 minutes before first meal of day., Disp: 30 tablet, Rfl: 3 .  potassium chloride (K-DUR) 10 MEQ tablet, Take 1 tablet (10 mEq total) by mouth 2 (two) times daily., Disp: 20 tablet, Rfl: 0 .  REBIF 22 MCG/0.5ML injection, Inject 22 mcg Sub-q 3 times weekly Rotate site after each injection, Disp: 6 mL, Rfl: 1 .  sucralfate (CARAFATE) 1 g tablet, Take 1 tablet (1  g total) by mouth 2 (two) times daily., Disp: 20 tablet, Rfl: 0 .  SUMAtriptan (IMITREX) 100 MG tablet, Take 100 mg by mouth once. May repeat in 2 hours if headache persists or recurs., Disp: , Rfl:  .  buPROPion (WELLBUTRIN) 100 MG tablet, Take 100 mg by mouth daily., Disp: , Rfl:  .  dicyclomine (BENTYL) 10 MG capsule, Take 1 capsule (10 mg total) by mouth 3 (three) times daily as needed for up to 5 days (abdominal pain/cramping)., Disp: 10 capsule, Rfl: 0 .  LIVALO 2 MG TABS, Take 1 tablet by mouth daily., Disp: , Rfl:   Past Medical Problems: Past Medical History:  Diagnosis Date  . Anxiety   . Anxiety   .  Depression   . Diverticulitis Sept 2011   CT documented  . GERD (gastroesophageal reflux disease)   . Headache   . Hemorrhoids   . Hyperlipidemia   . Hypertension   . moderate obesity   . MS (multiple sclerosis) (HCC)   . Narcolepsy     Past Surgical History: Past Surgical History:  Procedure Laterality Date  . ABDOMINAL HYSTERECTOMY    . BACK SURGERY  2004  . carpal tunnel repair  2009  . COLONOSCOPY  04/07/11   BSJ:GGEZM internal hemorrhoids/diverticula in the sigmoid colon  . ESOPHAGOGASTRODUODENOSCOPY  04/07/11   SLF: no evidence of h pylori/gastric body and antral mucosa with minimal chronic inflammation, negative H.pylori  . HEMORRHOID SURGERY  07/21/2011   Procedure: HEMORRHOIDECTOMY;  Surgeon: Dalia Heading, MD;  Location: [facility];  Service: General;  Laterality: N/A;  . PARTIAL HYSTERECTOMY  2008   with removal of rt ovary  . TUBAL LIGATION  1992    Social History: Social History   Socioeconomic History  . Marital status: Legally Separated    Spouse name: [name]  . Number of children: 2  . Years of education: college  . Highest education level: Not on file  Occupational History    Employer: NOT EMPLOYED  Tobacco Use  . Smoking status: Never Smoker  . Smokeless tobacco: Never Used  Substance and Sexual Activity  . Alcohol use: No  . Drug use: No  . Sexual activity: Yes  Other Topics Concern  . Not on file  Social History Narrative   [name]   Patient has two children.   Patient has a college education.         Social Determinants of Health   Financial Resource Strain: Not on file  Food Insecurity: Not on file  Transportation Needs: Not on file  Physical Activity: Not on file  Stress: Not on file  Social Connections: Not on file  Intimate Partner Violence: Not on file    Family History: Family History  Problem Relation Age of Onset  . Heart disease Father   . Diabetes Father   . Cancer Father         prostate  . Multiple sclerosis Sister   . Diabetes Sister   . Hyperlipidemia Sister   . Cancer Sister   . Cancer Mother        lung  . Hypertension Mother   . Hyperlipidemia Brother   . Other Brother        rare blood disorder  . Breast cancer Paternal Aunt   . Colon cancer Neg Hx   . Anesthesia problems Neg Hx   . Hypotension Neg Hx   . Malignant hyperthermia Neg Hx   .  Pseudochol deficiency Neg Hx     Review of Systems: Review of Systems  Constitutional: Negative for chills and fever.  HENT: Negative for congestion and sore throat.   Respiratory: Negative for cough and shortness of breath.   Cardiovascular: Negative for chest pain and palpitations.  Gastrointestinal: Negative for abdominal pain, nausea and vomiting.  Musculoskeletal: Positive for back pain and neck pain.  Skin: Negative for itching and rash.    Physical Exam: Vital Signs BP (!) 141/84 (BP Location: Right Arm, Patient Position: Sitting, Cuff Size: Large)   Pulse 64   Temp 98 F (36.7 C) (Oral)   Ht 5\' 3"  (1.6 m)   Wt 193 lb 12.8 oz (87.9 kg)   SpO2 100%   BMI 34.33 kg/m  Physical Exam Vitals and nursing note reviewed.  Constitutional:      General: She is not in acute distress.    Appearance: Normal appearance. She is not ill-appearing.  HENT:     Head: Normocephalic and atraumatic.  Eyes:     Extraocular Movements: Extraocular movements intact.  Cardiovascular:     Rate and Rhythm: Normal rate and regular rhythm.     Pulses: Normal pulses.     Heart sounds: Normal heart sounds.  Pulmonary:     Effort: Pulmonary effort is normal.     Breath sounds: Normal breath sounds. No wheezing, rhonchi or rales.  Abdominal:     General: Bowel sounds are normal.     Palpations: Abdomen is soft.  Musculoskeletal:        General: No swelling. Normal range of motion.     Cervical back: Normal range of motion.  Skin:    General: Skin is warm and dry.     Coloration: Skin is not pale.      Findings: No erythema or rash.  Neurological:     General: No focal deficit present.     Mental Status: She is alert and oriented to person, place, and time.  Psychiatric:        Mood and Affect: Mood normal.        Behavior: Behavior normal.        Thought Content: Thought content normal.        Judgment: Judgment normal.     Assessment/Plan:  Jill English scheduled for bilateral breast reduction with Dr. Kateri Plummer.  Risks, benefits, and alternatives of procedure discussed, questions answered and consent obtained.    Smoking Status: Non-smoker; Counseling Given?  N/A Last Mammogram: 09/26/2019; Results: No findings suspicious for malignancy  Caprini Score: Moderate; Risk Factors include: 54 year old female, BMI > 25, and length of planned surgery. Recommendation for mechanical or pharmacological prophylaxis during surgery. Encourage early ambulation.   Pictures obtained: 12/15/19  Post-op Rx sent to pharmacy: Norco, Zofran, Ibu, Tylenol  Patient was provided with the breast reduction risks and General Surgical Risk consent document and Pain Medication Agreement prior to their appointment.  They had adequate time to read through the risk consent documents and Pain Medication Agreement. We also discussed them in person together during this preop appointment. All of their questions were answered to their satisfaction.  Recommended calling if they have any further questions.  Risk consent form and Pain Medication Agreement to be scanned into patient's chart.  The risk that can be encountered with breast reduction were discussed and include the following but not limited to these:  Breast asymmetry, fluid accumulation, firmness of the breast, inability to breast feed, loss of nipple or areola, skin loss,  decrease or no nipple sensation, fat necrosis of the breast tissue, bleeding, infection, healing delay.  There are risks of anesthesia, changes to skin sensation and injury to nerves or blood vessels.   The muscle can be temporarily or permanently injured.  You may have an allergic reaction to tape, suture, glue, blood products which can result in skin discoloration, swelling, pain, skin lesions, poor healing.  Any of these can lead to the need for revisonal surgery or stage procedures.  A reduction has potential to interfere with diagnostic procedures.  Nipple or breast piercing can increase risks of infection.  This procedure is best done when the breast is fully developed.  Changes in the breast will continue to occur over time.  Pregnancy can alter the outcomes of previous breast reduction surgery, weight gain and weigh loss can also effect the long term appearance.   Electronically signed by: Eldridge Abrahams, PA-C 05/23/2020 9:30 AM

## 2020-05-23 ENCOUNTER — Ambulatory Visit (INDEPENDENT_AMBULATORY_CARE_PROVIDER_SITE_OTHER): Payer: Medicare Other | Admitting: Plastic Surgery

## 2020-05-23 ENCOUNTER — Other Ambulatory Visit: Payer: Self-pay

## 2020-05-23 ENCOUNTER — Encounter: Payer: Self-pay | Admitting: Plastic Surgery

## 2020-05-23 VITALS — BP 141/84 | HR 64 | Temp 98.0°F | Ht 63.0 in | Wt 193.8 lb

## 2020-05-23 DIAGNOSIS — M546 Pain in thoracic spine: Secondary | ICD-10-CM

## 2020-05-23 DIAGNOSIS — M545 Low back pain, unspecified: Secondary | ICD-10-CM

## 2020-05-23 DIAGNOSIS — M4004 Postural kyphosis, thoracic region: Secondary | ICD-10-CM

## 2020-05-23 DIAGNOSIS — N62 Hypertrophy of breast: Secondary | ICD-10-CM

## 2020-05-23 MED ORDER — HYDROCODONE-ACETAMINOPHEN 5-325 MG PO TABS: 1.0000 | ORAL_TABLET | Freq: Three times a day (TID) | ORAL | 0 refills | Status: AC | PRN

## 2020-05-23 MED ORDER — ACETAMINOPHEN 500 MG PO TABS
500.0000 mg | ORAL_TABLET | Freq: Four times a day (QID) | ORAL | 0 refills | Status: AC | PRN
Start: 2020-05-23 — End: ?

## 2020-05-23 MED ORDER — ONDANSETRON HCL 4 MG PO TABS: 4.0000 mg | ORAL_TABLET | Freq: Three times a day (TID) | ORAL | 0 refills | Status: DC | PRN

## 2020-05-23 MED ORDER — IBUPROFEN 600 MG PO TABS
600.0000 mg | ORAL_TABLET | Freq: Four times a day (QID) | ORAL | 0 refills | Status: DC | PRN
Start: 2020-05-23 — End: 2023-08-04

## 2020-05-29 ENCOUNTER — Other Ambulatory Visit: Payer: Self-pay

## 2020-05-29 ENCOUNTER — Encounter (HOSPITAL_BASED_OUTPATIENT_CLINIC_OR_DEPARTMENT_OTHER): Payer: Self-pay | Admitting: Plastic Surgery

## 2020-06-01 ENCOUNTER — Other Ambulatory Visit (HOSPITAL_COMMUNITY): Payer: Medicare Other

## 2020-06-05 ENCOUNTER — Ambulatory Visit (HOSPITAL_BASED_OUTPATIENT_CLINIC_OR_DEPARTMENT_OTHER)
Admission: RE | Admit: 2020-06-05 | Payer: Medicare (Managed Care) | Source: Home / Self Care | Admitting: Plastic Surgery

## 2020-06-05 HISTORY — DX: Sleep apnea, unspecified: G47.30

## 2020-06-05 HISTORY — DX: Personal history of other diseases of the digestive system: Z87.19

## 2020-06-05 HISTORY — DX: Cardiac murmur, unspecified: R01.1

## 2020-06-05 SURGERY — MAMMOPLASTY, REDUCTION
Anesthesia: General | Site: Breast | Laterality: Bilateral

## 2020-06-13 ENCOUNTER — Encounter: Payer: Medicare Other | Admitting: Plastic Surgery

## 2020-06-20 ENCOUNTER — Encounter: Payer: Medicare Other | Admitting: Surgical

## 2020-08-09 ENCOUNTER — Other Ambulatory Visit: Admission: RE | Admit: 2020-08-09 | Payer: Medicare Other | Source: Ambulatory Visit

## 2020-08-13 ENCOUNTER — Ambulatory Visit: Admit: 2020-08-13 | Payer: Medicare Other | Admitting: Internal Medicine

## 2020-08-13 SURGERY — ESOPHAGOGASTRODUODENOSCOPY (EGD) WITH PROPOFOL
Anesthesia: General

## 2020-09-04 ENCOUNTER — Encounter: Payer: Medicare (Managed Care) | Admitting: Surgical

## 2020-09-04 NOTE — Progress Notes (Deleted)
Patient ID: Jill English, female    DOB: 1965/11/11, 54 y.o.   MRN: [id number]  No chief complaint on file.     ICD-10-CM   1. Macromastia  N62   2. Postural kyphosis, thoracic region  M40.04   3. Back pain of thoracolumbar region  M54.50    M54.6      History of Present Illness: Jill English is a 55 y.o.  female  with a history of macromastia.  She presents for preoperative evaluation for upcoming procedure, Bilateral Breast Reduction, scheduled for *** with Dr.  Arita Miss  The patient {HAS HAS NOT:[id number]} had problems with anesthesia. ***  Summary of Previous Visit: She is currently double D and wants to be about a C.  She is up-to-date on her mammograms and has never had a suspicious finding.  She does not smoke and is not a diabetic.  STN is 30 cm bilaterally  Estimated excess breast tissue to be removed at time of surgery: 600 on the left and 600 on the right.  Job: ***  PMH Significant for: Anxiety, depression, GERD, MS   Past Medical History: Allergies: Allergies  Allergen Reactions  . Contrast Media [Iodinated Diagnostic Agents] Swelling    Facial swelling  . Gadolinium Derivatives Itching and Rash    Patient had received contrast before without any adverse effects. About 15 minutes after injection patient began to itch and showed signs of facial rash/swelling for several minutes. Radiologist examined and cleared patient, but suggested premedication for any future contrast injections.   . Latex Rash    Current Medications:  Current Outpatient Medications:  .  acetaminophen (TYLENOL) 500 MG tablet, Take 1 tablet (500 mg total) by mouth every 6 (six) hours as needed. For use AFTER surgery, Disp: 30 tablet, Rfl: 0 .  cyclobenzaprine (FLEXERIL) 10 MG tablet, Take 1 tablet (10 mg total) by mouth 3 (three) times daily as needed for muscle spasms., Disp: 30 tablet, Rfl: 0 .  desonide (DESOWEN) 0.05 % lotion, Apply 1 application topically Twice daily as  needed. Apply to scalp if a flare-up of alopecia, Disp: , Rfl:  .  ELDERBERRY PO, Take by mouth., Disp: , Rfl:  .  fexofenadine (ALLEGRA) 180 MG tablet, Take 180 mg by mouth daily., Disp: , Rfl:  .  fluticasone (FLONASE) 50 MCG/ACT nasal spray, Place 2 sprays into the nose daily as needed. Sinuses, Disp: , Rfl:  .  hydrochlorothiazide (HYDRODIURIL) 25 MG tablet, Take 25 mg by mouth daily., Disp: , Rfl:  .  ibuprofen (ADVIL) 600 MG tablet, Take 1 tablet (600 mg total) by mouth every 6 (six) hours as needed for mild pain or moderate pain. For use AFTER surgery, Disp: 30 tablet, Rfl: 0 .  LIVALO 2 MG TABS, Take 1 tablet by mouth daily., Disp: , Rfl:  .  Multiple Vitamin (MULTIVITAMIN) capsule, Take 1 capsule by mouth daily., Disp: , Rfl:  .  ondansetron (ZOFRAN) 4 MG tablet, Take 1 tablet (4 mg total) by mouth every 8 (eight) hours as needed for nausea or vomiting., Disp: 20 tablet, Rfl: 0 .  pantoprazole (PROTONIX) 40 MG tablet, Take 1 tablet (40 mg total) by mouth daily. 30 minutes before first meal of day., Disp: 30 tablet, Rfl: 3 .  potassium chloride (K-DUR) 10 MEQ tablet, Take 1 tablet (10 mEq total) by mouth 2 (two) times daily., Disp: 20 tablet, Rfl: 0 .  sucralfate (CARAFATE) 1 g tablet, Take 1 tablet (1 g total) by  mouth 2 (two) times daily., Disp: 20 tablet, Rfl: 0 .  SUMAtriptan (IMITREX) 100 MG tablet, Take 100 mg by mouth once. May repeat in 2 hours if headache persists or recurs., Disp: , Rfl:   Past Medical Problems: Past Medical History:  Diagnosis Date  . Anxiety   . Anxiety   . Depression   . Diverticulitis Sept 2011   CT documented  . GERD (gastroesophageal reflux disease)   . Headache   . Heart murmur    seen by cardiologist, normal work up  . Hemorrhoids   . History of hiatal hernia   . Hyperlipidemia   . Hypertension   . moderate obesity   . MS (multiple sclerosis) ([facility])   . Narcolepsy   . Sleep apnea    uses cpap    Past Surgical History: Past Surgical  History:  Procedure Laterality Date  . ABDOMINAL HYSTERECTOMY    . BACK SURGERY  2004  . carpal tunnel repair  2009  . COLONOSCOPY  04/07/11   GYJ:EHUDJ internal hemorrhoids/diverticula in the sigmoid colon  . ESOPHAGOGASTRODUODENOSCOPY  04/07/11   SLF: no evidence of h pylori/gastric body and antral mucosa with minimal chronic inflammation, negative H.pylori  . HEMORRHOID SURGERY  07/21/2011   Procedure: HEMORRHOIDECTOMY;  Surgeon: Dalia Heading, MD;  Location: [facility];  Service: General;  Laterality: N/A;  . PARTIAL HYSTERECTOMY  2008   with removal of rt ovary  . TUBAL LIGATION  1992    Social History: Social History   Socioeconomic History  . Marital status: Legally Separated    Spouse name: [name]  . Number of children: 2  . Years of education: college  . Highest education level: Not on file  Occupational History    Employer: NOT EMPLOYED  Tobacco Use  . Smoking status: Never Smoker  . Smokeless tobacco: Never Used  Substance and Sexual Activity  . Alcohol use: No  . Drug use: No  . Sexual activity: Yes  Other Topics Concern  . Not on file  Social History Narrative   [name]   Patient has two children.   Patient has a college education.         Social Determinants of Health   Financial Resource Strain: Not on file  Food Insecurity: Not on file  Transportation Needs: Not on file  Physical Activity: Not on file  Stress: Not on file  Social Connections: Not on file  Intimate Partner Violence: Not on file    Family History: Family History  Problem Relation Age of Onset  . Heart disease Father   . Diabetes Father   . Cancer Father        prostate  . Multiple sclerosis Sister   . Diabetes Sister   . Hyperlipidemia Sister   . Cancer Sister   . Cancer Mother        lung  . Hypertension Mother   . Hyperlipidemia Brother   . Other Brother        rare blood disorder  . Breast cancer Paternal Aunt   . Colon  cancer Neg Hx   . Anesthesia problems Neg Hx   . Hypotension Neg Hx   . Malignant hyperthermia Neg Hx   . Pseudochol deficiency Neg Hx     Review of Systems: ROS  Physical Exam: Vital Signs There were no vitals taken for this visit.  Physical Exam *** Constitutional:      General: Not in acute  distress.    Appearance: Normal appearance. Not ill-appearing.  HENT:     Head: Normocephalic and atraumatic.  Eyes:     Pupils: Pupils are equal, round Neck:     Musculoskeletal: Normal range of motion.  Cardiovascular:     Rate and Rhythm: Normal rate    Pulses: Normal pulses.  Pulmonary:     Effort: Pulmonary effort is normal. No respiratory distress.  Abdominal:     General: Abdomen is flat. There is no distension.  Musculoskeletal: Normal range of motion.  Skin:    General: Skin is warm and dry.     Findings: No erythema or rash.  Neurological:     General: No focal deficit present.     Mental Status: Alert and oriented to person, place, and time. Mental status is at baseline.     Motor: No weakness.  Psychiatric:        Mood and Affect: Mood normal.        Behavior: Behavior normal.    Assessment/Plan: The patient is scheduled for bilateral breast reduction with Dr. {[id number]::"XQJJ","HERDEYCXKG"}.  Risks, benefits, and alternatives of procedure discussed, questions answered and consent obtained.    Smoking Status: ***; Counseling Given? *** Last Mammogram: ***; Results: ***  Caprini Score: ***; Risk Factors include: ***, BMI *** 25, and length of planned surgery. Recommendation for mechanical *** pharmacological prophylaxis. Encourage early ambulation.   Pictures obtained: ***  Post-op Rx sent to pharmacy: ***  Patient was provided with the breast reduction and General Surgical Risk consent document and Pain Medication Agreement prior to their appointment.  They had adequate time to read through the risk consent documents and Pain Medication Agreement. We also  discussed them in person together during this preop appointment. All of their questions were answered to their satisfaction.  Recommended calling if they have any further questions.  Risk consent form and Pain Medication Agreement to be scanned into patient's chart.  The risk that can be encountered with breast reduction were discussed and include the following but not limited to these:  Breast asymmetry, fluid accumulation, firmness of the breast, inability to breast feed, loss of nipple or areola, skin loss, decrease or no nipple sensation, fat necrosis of the breast tissue, bleeding, infection, healing delay.  There are risks of anesthesia, changes to skin sensation and injury to nerves or blood vessels.  The muscle can be temporarily or permanently injured.  You may have an allergic reaction to tape, suture, glue, blood products which can result in skin discoloration, swelling, pain, skin lesions, poor healing.  Any of these can lead to the need for revisonal surgery or stage procedures.  A reduction has potential to interfere with diagnostic procedures.  Nipple or breast piercing can increase risks of infection.  This procedure is best done when the breast is fully developed.  Changes in the breast will continue to occur over time.  Pregnancy can alter the outcomes of previous breast reduction surgery, weight gain and weigh loss can also effect the long term appearance.   Lipo?***  Electronically signed by: Kermit Balo Tanya Marvin, PA-C 09/04/2020 12:16 PM

## 2020-09-15 ENCOUNTER — Other Ambulatory Visit (HOSPITAL_COMMUNITY): Payer: Medicare (Managed Care)

## 2020-09-17 ENCOUNTER — Other Ambulatory Visit: Payer: Medicare Other

## 2020-09-18 ENCOUNTER — Encounter (HOSPITAL_BASED_OUTPATIENT_CLINIC_OR_DEPARTMENT_OTHER): Payer: Self-pay

## 2020-09-18 ENCOUNTER — Ambulatory Visit (HOSPITAL_BASED_OUTPATIENT_CLINIC_OR_DEPARTMENT_OTHER): Admit: 2020-09-18 | Payer: Medicare Other | Admitting: Plastic Surgery

## 2020-09-18 SURGERY — MAMMOPLASTY, REDUCTION
Anesthesia: General | Site: Breast | Laterality: Bilateral

## 2020-09-26 ENCOUNTER — Encounter: Payer: Medicare (Managed Care) | Admitting: Plastic Surgery

## 2020-10-05 ENCOUNTER — Encounter: Payer: Medicare (Managed Care) | Admitting: Surgical

## 2020-12-17 ENCOUNTER — Telehealth: Payer: Self-pay | Admitting: Plastic Surgery

## 2020-12-17 NOTE — Telephone Encounter (Signed)
Patient called wanting to reschedule her breast reduction surgery. This case was discussed with Dr. Arita Miss, and the decision to not reschedule her surgery at this time was confirmed. The patient has canceled 2 surgeries and either no-showed or last-minute-canceled several appointments. We will be happy to forward records to another plastic surgeon of her choice, she would just need to have the records request sent to our office. Patient voiced understanding and agreement.

## 2021-06-03 DIAGNOSIS — R7302 Impaired glucose tolerance (oral): Secondary | ICD-10-CM | POA: Diagnosis not present

## 2021-06-03 DIAGNOSIS — E782 Mixed hyperlipidemia: Secondary | ICD-10-CM | POA: Diagnosis not present

## 2021-06-03 DIAGNOSIS — I10 Essential (primary) hypertension: Secondary | ICD-10-CM | POA: Diagnosis not present

## 2021-06-03 DIAGNOSIS — K219 Gastro-esophageal reflux disease without esophagitis: Secondary | ICD-10-CM | POA: Diagnosis not present

## 2021-06-17 DIAGNOSIS — R7302 Impaired glucose tolerance (oral): Secondary | ICD-10-CM | POA: Diagnosis not present

## 2021-06-17 DIAGNOSIS — I10 Essential (primary) hypertension: Secondary | ICD-10-CM | POA: Diagnosis not present

## 2021-06-17 DIAGNOSIS — E782 Mixed hyperlipidemia: Secondary | ICD-10-CM | POA: Diagnosis not present

## 2021-07-08 DIAGNOSIS — G35 Multiple sclerosis: Secondary | ICD-10-CM | POA: Diagnosis not present

## 2021-07-16 DIAGNOSIS — G4733 Obstructive sleep apnea (adult) (pediatric): Secondary | ICD-10-CM | POA: Diagnosis not present

## 2021-07-22 DIAGNOSIS — G35 Multiple sclerosis: Secondary | ICD-10-CM | POA: Diagnosis not present

## 2021-08-05 DIAGNOSIS — E782 Mixed hyperlipidemia: Secondary | ICD-10-CM | POA: Diagnosis not present

## 2021-08-05 DIAGNOSIS — I10 Essential (primary) hypertension: Secondary | ICD-10-CM | POA: Diagnosis not present

## 2021-08-05 DIAGNOSIS — R7302 Impaired glucose tolerance (oral): Secondary | ICD-10-CM | POA: Diagnosis not present

## 2021-08-19 DIAGNOSIS — G35 Multiple sclerosis: Secondary | ICD-10-CM | POA: Diagnosis not present

## 2021-09-27 DIAGNOSIS — E559 Vitamin D deficiency, unspecified: Secondary | ICD-10-CM | POA: Diagnosis not present

## 2021-09-27 DIAGNOSIS — I10 Essential (primary) hypertension: Secondary | ICD-10-CM | POA: Diagnosis not present

## 2021-09-27 DIAGNOSIS — R7989 Other specified abnormal findings of blood chemistry: Secondary | ICD-10-CM | POA: Diagnosis not present

## 2021-09-27 DIAGNOSIS — R5383 Other fatigue: Secondary | ICD-10-CM | POA: Diagnosis not present

## 2021-10-07 DIAGNOSIS — G35 Multiple sclerosis: Secondary | ICD-10-CM | POA: Diagnosis not present

## 2021-10-14 DIAGNOSIS — E782 Mixed hyperlipidemia: Secondary | ICD-10-CM | POA: Diagnosis not present

## 2021-10-14 DIAGNOSIS — R7302 Impaired glucose tolerance (oral): Secondary | ICD-10-CM | POA: Diagnosis not present

## 2021-10-14 DIAGNOSIS — I10 Essential (primary) hypertension: Secondary | ICD-10-CM | POA: Diagnosis not present

## 2021-10-17 ENCOUNTER — Other Ambulatory Visit: Payer: Self-pay | Admitting: Physician Assistant

## 2021-10-17 DIAGNOSIS — G35 Multiple sclerosis: Secondary | ICD-10-CM

## 2021-11-04 ENCOUNTER — Ambulatory Visit: Payer: Medicare Other

## 2021-11-04 DIAGNOSIS — G35 Multiple sclerosis: Secondary | ICD-10-CM | POA: Diagnosis not present

## 2021-11-13 ENCOUNTER — Ambulatory Visit
Admission: RE | Admit: 2021-11-13 | Discharge: 2021-11-13 | Disposition: A | Discharge status: Home / Self Care | Payer: Medicare Other | Source: Ambulatory Visit | Attending: Physician Assistant | Admitting: Physician Assistant

## 2021-11-13 DIAGNOSIS — I6381 Other cerebral infarction due to occlusion or stenosis of small artery: Secondary | ICD-10-CM | POA: Diagnosis not present

## 2021-11-13 DIAGNOSIS — M542 Cervicalgia: Secondary | ICD-10-CM | POA: Diagnosis not present

## 2021-11-13 DIAGNOSIS — G35 Multiple sclerosis: Secondary | ICD-10-CM | POA: Diagnosis not present

## 2021-11-13 DIAGNOSIS — R202 Paresthesia of skin: Secondary | ICD-10-CM | POA: Diagnosis not present

## 2021-11-13 DIAGNOSIS — R200 Anesthesia of skin: Secondary | ICD-10-CM | POA: Diagnosis not present

## 2021-12-03 DIAGNOSIS — G4733 Obstructive sleep apnea (adult) (pediatric): Secondary | ICD-10-CM | POA: Diagnosis not present

## 2021-12-07 DIAGNOSIS — S61254A Open bite of right ring finger without damage to nail, initial encounter: Secondary | ICD-10-CM | POA: Diagnosis not present

## 2022-01-20 DIAGNOSIS — G35 Multiple sclerosis: Secondary | ICD-10-CM | POA: Diagnosis not present

## 2022-01-20 DIAGNOSIS — M5410 Radiculopathy, site unspecified: Secondary | ICD-10-CM | POA: Diagnosis not present

## 2022-01-20 DIAGNOSIS — Z79899 Other long term (current) drug therapy: Secondary | ICD-10-CM | POA: Diagnosis not present

## 2022-02-03 ENCOUNTER — Other Ambulatory Visit: Payer: Self-pay | Admitting: Internal Medicine

## 2022-02-03 DIAGNOSIS — Z1231 Encounter for screening mammogram for malignant neoplasm of breast: Secondary | ICD-10-CM

## 2022-02-03 DIAGNOSIS — K219 Gastro-esophageal reflux disease without esophagitis: Secondary | ICD-10-CM | POA: Diagnosis not present

## 2022-02-03 DIAGNOSIS — E559 Vitamin D deficiency, unspecified: Secondary | ICD-10-CM | POA: Diagnosis not present

## 2022-02-03 DIAGNOSIS — E782 Mixed hyperlipidemia: Secondary | ICD-10-CM | POA: Diagnosis not present

## 2022-02-03 DIAGNOSIS — R7302 Impaired glucose tolerance (oral): Secondary | ICD-10-CM | POA: Diagnosis not present

## 2022-02-03 DIAGNOSIS — I10 Essential (primary) hypertension: Secondary | ICD-10-CM | POA: Diagnosis not present

## 2022-02-11 DIAGNOSIS — M47892 Other spondylosis, cervical region: Secondary | ICD-10-CM | POA: Diagnosis not present

## 2022-02-17 DIAGNOSIS — M47892 Other spondylosis, cervical region: Secondary | ICD-10-CM | POA: Diagnosis not present

## 2022-02-24 DIAGNOSIS — R7302 Impaired glucose tolerance (oral): Secondary | ICD-10-CM | POA: Diagnosis not present

## 2022-02-24 DIAGNOSIS — I10 Essential (primary) hypertension: Secondary | ICD-10-CM | POA: Diagnosis not present

## 2022-02-24 DIAGNOSIS — K219 Gastro-esophageal reflux disease without esophagitis: Secondary | ICD-10-CM | POA: Diagnosis not present

## 2022-02-24 DIAGNOSIS — E782 Mixed hyperlipidemia: Secondary | ICD-10-CM | POA: Diagnosis not present

## 2022-02-24 DIAGNOSIS — E559 Vitamin D deficiency, unspecified: Secondary | ICD-10-CM | POA: Diagnosis not present

## 2022-03-03 DIAGNOSIS — M47892 Other spondylosis, cervical region: Secondary | ICD-10-CM | POA: Diagnosis not present

## 2022-03-20 HISTORY — PX: REDUCTION MAMMAPLASTY: SUR839

## 2022-03-31 DIAGNOSIS — I10 Essential (primary) hypertension: Secondary | ICD-10-CM | POA: Diagnosis not present

## 2022-03-31 DIAGNOSIS — R7302 Impaired glucose tolerance (oral): Secondary | ICD-10-CM | POA: Diagnosis not present

## 2022-03-31 DIAGNOSIS — E782 Mixed hyperlipidemia: Secondary | ICD-10-CM | POA: Diagnosis not present

## 2022-04-02 DIAGNOSIS — G4733 Obstructive sleep apnea (adult) (pediatric): Secondary | ICD-10-CM | POA: Diagnosis not present

## 2022-04-07 ENCOUNTER — Ambulatory Visit
Admission: RE | Admit: 2022-04-07 | Discharge: 2022-04-07 | Disposition: A | Discharge status: Home / Self Care | Payer: Medicare Other | Source: Ambulatory Visit | Attending: Internal Medicine | Admitting: Internal Medicine

## 2022-04-07 DIAGNOSIS — Z1231 Encounter for screening mammogram for malignant neoplasm of breast: Secondary | ICD-10-CM | POA: Diagnosis not present

## 2022-04-08 ENCOUNTER — Encounter: Payer: Self-pay | Admitting: Internal Medicine

## 2022-04-08 DIAGNOSIS — N63 Unspecified lump in unspecified breast: Secondary | ICD-10-CM

## 2022-04-08 DIAGNOSIS — R928 Other abnormal and inconclusive findings on diagnostic imaging of breast: Secondary | ICD-10-CM

## 2022-04-22 ENCOUNTER — Other Ambulatory Visit: Payer: Self-pay | Admitting: Internal Medicine

## 2022-04-22 DIAGNOSIS — R928 Other abnormal and inconclusive findings on diagnostic imaging of breast: Secondary | ICD-10-CM

## 2022-04-22 DIAGNOSIS — N63 Unspecified lump in unspecified breast: Secondary | ICD-10-CM

## 2022-05-05 DIAGNOSIS — Z23 Encounter for immunization: Secondary | ICD-10-CM | POA: Diagnosis not present

## 2022-05-05 DIAGNOSIS — I10 Essential (primary) hypertension: Secondary | ICD-10-CM | POA: Diagnosis not present

## 2022-05-05 DIAGNOSIS — E782 Mixed hyperlipidemia: Secondary | ICD-10-CM | POA: Diagnosis not present

## 2022-05-05 DIAGNOSIS — G35 Multiple sclerosis: Secondary | ICD-10-CM | POA: Diagnosis not present

## 2022-05-05 DIAGNOSIS — R7302 Impaired glucose tolerance (oral): Secondary | ICD-10-CM | POA: Diagnosis not present

## 2022-05-06 ENCOUNTER — Ambulatory Visit
Admission: RE | Admit: 2022-05-06 | Discharge: 2022-05-06 | Disposition: A | Discharge status: Home / Self Care | Payer: Medicare Other | Source: Ambulatory Visit | Attending: Internal Medicine | Admitting: Internal Medicine

## 2022-05-06 DIAGNOSIS — N63 Unspecified lump in unspecified breast: Secondary | ICD-10-CM | POA: Diagnosis not present

## 2022-05-06 DIAGNOSIS — N6002 Solitary cyst of left breast: Secondary | ICD-10-CM | POA: Diagnosis not present

## 2022-05-06 DIAGNOSIS — R928 Other abnormal and inconclusive findings on diagnostic imaging of breast: Secondary | ICD-10-CM

## 2022-05-07 ENCOUNTER — Encounter: Payer: Self-pay | Admitting: Internal Medicine

## 2022-05-15 ENCOUNTER — Other Ambulatory Visit: Payer: Self-pay | Admitting: Internal Medicine

## 2022-05-15 DIAGNOSIS — R928 Other abnormal and inconclusive findings on diagnostic imaging of breast: Secondary | ICD-10-CM

## 2022-05-15 DIAGNOSIS — N6002 Solitary cyst of left breast: Secondary | ICD-10-CM

## 2022-05-29 ENCOUNTER — Ambulatory Visit
Admission: RE | Admit: 2022-05-29 | Discharge: 2022-05-29 | Disposition: A | Discharge status: Home / Self Care | Payer: Medicare Other | Source: Ambulatory Visit | Attending: Internal Medicine | Admitting: Internal Medicine

## 2022-05-29 DIAGNOSIS — N6002 Solitary cyst of left breast: Secondary | ICD-10-CM | POA: Insufficient documentation

## 2022-05-29 DIAGNOSIS — N6012 Diffuse cystic mastopathy of left breast: Secondary | ICD-10-CM | POA: Diagnosis not present

## 2022-05-29 DIAGNOSIS — N6325 Unspecified lump in the left breast, overlapping quadrants: Secondary | ICD-10-CM | POA: Diagnosis not present

## 2022-05-29 DIAGNOSIS — R928 Other abnormal and inconclusive findings on diagnostic imaging of breast: Secondary | ICD-10-CM

## 2022-05-29 HISTORY — PX: BREAST BIOPSY: SHX20

## 2022-05-29 MED ORDER — LIDOCAINE HCL (PF) 1 % IJ SOLN
5.0000 mL | Freq: Once | INTRAMUSCULAR | Status: AC
  Administered 2022-05-29: 5 mL

## 2022-05-30 LAB — SURGICAL PATHOLOGY

## 2022-06-16 DIAGNOSIS — I10 Essential (primary) hypertension: Secondary | ICD-10-CM | POA: Diagnosis not present

## 2022-06-16 DIAGNOSIS — R109 Unspecified abdominal pain: Secondary | ICD-10-CM | POA: Diagnosis not present

## 2022-06-16 DIAGNOSIS — R7302 Impaired glucose tolerance (oral): Secondary | ICD-10-CM | POA: Diagnosis not present

## 2022-06-16 DIAGNOSIS — E782 Mixed hyperlipidemia: Secondary | ICD-10-CM | POA: Diagnosis not present

## 2022-07-21 DIAGNOSIS — G4733 Obstructive sleep apnea (adult) (pediatric): Secondary | ICD-10-CM | POA: Diagnosis not present

## 2022-07-28 ENCOUNTER — Ambulatory Visit (INDEPENDENT_AMBULATORY_CARE_PROVIDER_SITE_OTHER): Payer: 59 | Admitting: Internal Medicine

## 2022-07-28 ENCOUNTER — Encounter: Payer: Self-pay | Admitting: Internal Medicine

## 2022-07-28 VITALS — BP 134/82 | HR 78 | Ht 63.0 in | Wt 175.4 lb

## 2022-07-28 DIAGNOSIS — G8929 Other chronic pain: Secondary | ICD-10-CM

## 2022-07-28 DIAGNOSIS — E669 Obesity, unspecified: Secondary | ICD-10-CM

## 2022-07-28 DIAGNOSIS — E782 Mixed hyperlipidemia: Secondary | ICD-10-CM | POA: Diagnosis not present

## 2022-07-28 DIAGNOSIS — R7302 Impaired glucose tolerance (oral): Secondary | ICD-10-CM

## 2022-07-28 DIAGNOSIS — D508 Other iron deficiency anemias: Secondary | ICD-10-CM | POA: Diagnosis not present

## 2022-07-28 DIAGNOSIS — M545 Low back pain, unspecified: Secondary | ICD-10-CM

## 2022-07-28 DIAGNOSIS — I10 Essential (primary) hypertension: Secondary | ICD-10-CM

## 2022-07-28 DIAGNOSIS — E66811 Obesity, class 1: Secondary | ICD-10-CM | POA: Insufficient documentation

## 2022-07-28 MED ORDER — MOUNJARO 10 MG/0.5ML ~~LOC~~ SOAJ: 10.0000 mg | SUBCUTANEOUS | 2 refills | Status: DC

## 2022-07-28 NOTE — Progress Notes (Signed)
Established Patient Office Visit  Subjective:  Patient ID: Jill English, female    DOB: 1965/12/29  Age: 57 y.o. MRN: [id number]  Chief Complaint  Patient presents with   Follow-up    6 week follow up    Patient comes in for her follow-up today.  She is currently on Ozempic injections 2 mg/week, but she has not lost any weight since her last visit.  Patient admits that she does not feel the same effect as before with Ozempic and she is eating more than before.  She has never had any significant nausea but she was able to control her diet and lost weight.   Will consider switching to [facility] injections. Labs were discussed ,patient's LDL is higher than before, and Zetia was added to her Livalo tablets.     Past Medical History:  Diagnosis Date   Anxiety    Anxiety    Depression    Diverticulitis Sept 2011   CT documented   GERD (gastroesophageal reflux disease)    Headache    Heart murmur    seen by cardiologist, normal work up   Hemorrhoids    History of hiatal hernia    Hyperlipidemia    Hypertension    moderate obesity    MS (multiple sclerosis) ([place])    Narcolepsy    Sleep apnea    uses cpap  . Past Surgical History:  Procedure Laterality Date   ABDOMINAL HYSTERECTOMY     BACK SURGERY  2004   BREAST BIOPSY Left 05/29/2022   [place] Core BX Coil Clip - path pending   BREAST BIOPSY Left 05/29/2022   [place] LT BREAST BX W LOC DEV 1ST LESION IMG BX SPEC US GUIDE 05/29/2022 [facility]   carpal tunnel repair  2009   COLONOSCOPY  04/07/2011   [id number] internal hemorrhoids/diverticula in the sigmoid colon   ESOPHAGOGASTRODUODENOSCOPY  04/07/2011   SLF: no evidence of h pylori/gastric body and antral mucosa with minimal chronic inflammation, negative H.pylori   HEMORRHOID SURGERY  07/21/2011   Procedure: HEMORRHOIDECTOMY;  Surgeon: Jamesetta So, MD;  Location: [facility];  Service: General;  Laterality: N/A;   PARTIAL HYSTERECTOMY  2008   with removal of rt ovary    REDUCTION MAMMAPLASTY Bilateral 03/20/2022   TUBAL LIGATION  1992   surg  Social History   Socioeconomic History   Marital status: Legally Separated    Spouse name: Richardson Landry   Number of children: 2   Years of education: college   Highest education level: Not on file  Occupational History    Employer: NOT EMPLOYED  Tobacco Use   Smoking status: Never   Smokeless tobacco: Never  Substance and Sexual Activity   Alcohol use: No   Drug use: No   Sexual activity: Yes  Other Topics Concern   Not on file  Social History Narrative   Patient is married Richardson Landry) and lives with her husband.   Patient has two children.   Patient has a college education.         Social Determinants of Health   Financial Resource Strain: Not on file  Food Insecurity: Not on file  Transportation Needs: Not on file  Physical Activity: Not on file  Stress: Not on file  Social Connections: Not on file  Intimate Partner Violence: Not on file    Family History  Problem Relation Age of Onset   Heart disease Father    Diabetes Father    Cancer Father  prostate   Multiple sclerosis Sister    Diabetes Sister    Hyperlipidemia Sister    Cancer Sister    Cancer Mother        lung   Hypertension Mother    Hyperlipidemia Brother    Other Brother        rare blood disorder   Breast cancer Paternal Aunt    Colon cancer Neg Hx    Anesthesia problems Neg Hx    Hypotension Neg Hx    Malignant hyperthermia Neg Hx    Pseudochol deficiency Neg Hx     Allergies  Allergen Reactions   Contrast Media [Iodinated Contrast Media] Swelling    Facial swelling   Gadolinium Derivatives Itching and Rash    Patient had received contrast before without any adverse effects. About 15 minutes after injection patient began to itch and showed signs of facial rash/swelling for several minutes. Radiologist examined and cleared patient, but suggested premedication for any future contrast injections.    Latex Rash     Review of Systems  Constitutional: Negative.   HENT: Negative.    Eyes: Negative.   Respiratory: Negative.    Cardiovascular: Negative.   Gastrointestinal: Negative.   Genitourinary: Negative.   Musculoskeletal: Negative.   Skin: Negative.   Neurological: Negative.   Endo/Heme/Allergies: Negative.   Psychiatric/Behavioral: Negative.         Objective:   BP 134/82   Pulse 78   Ht '5\' 3"'$  (1.6 m)   Wt 175 lb 6.4 oz (79.6 kg)   SpO2 98%   BMI 31.07 kg/m   Vitals:   07/28/22 0946  BP: 134/82  Pulse: 78  Height: '5\' 3"'$  (1.6 m)  Weight: 175 lb 6.4 oz (79.6 kg)  SpO2: 98%  BMI (Calculated): 31.08    Physical Exam Vitals and nursing note reviewed.  Constitutional:      Appearance: Normal appearance.  HENT:     Head: Normocephalic.  Cardiovascular:     Rate and Rhythm: Normal rate and regular rhythm.     Pulses: Normal pulses.  Pulmonary:     Effort: Pulmonary effort is normal.     Breath sounds: Normal breath sounds.  Abdominal:     General: Abdomen is flat. Bowel sounds are normal.     Palpations: Abdomen is soft.  Musculoskeletal:        General: Normal range of motion.     Cervical back: Normal range of motion and neck supple.  Neurological:     General: No focal deficit present.     Mental Status: She is alert.  Psychiatric:        Mood and Affect: Mood normal.        Behavior: Behavior normal.      No results found for any visits on 07/28/22.      Assessment & Plan:  Patient advised to stop her Ozempic. Prescription sent for [facility] will start at 10 mg once a week.  Patient advised to control her diet strictly and increase her physical activity. Problem List Items Addressed This Visit     Impaired glucose tolerance - Primary   Relevant Medications   tirzepatide (MOUNJARO) 10 MG/0.5ML Pen   Obesity (BMI 30.0-34.9)   Essential hypertension, benign   Relevant Medications   ezetimibe (ZETIA) 10 MG tablet   valsartan-hydrochlorothiazide  (DIOVAN-HCT) 160-25 MG tablet   Mixed hyperlipidemia   Relevant Medications   ezetimibe (ZETIA) 10 MG tablet   valsartan-hydrochlorothiazide (DIOVAN-HCT) 160-25 MG tablet  Iron deficiency anemia secondary to inadequate dietary iron intake   Other Visit Diagnoses     Chronic bilateral low back pain without sciatica           Return in about 4 weeks (around 08/25/2022).   Total time spent: 30 minutes  Perrin Maltese, MD  07/28/2022

## 2022-07-31 ENCOUNTER — Other Ambulatory Visit: Payer: Self-pay | Admitting: Internal Medicine

## 2022-08-04 ENCOUNTER — Telehealth: Payer: Self-pay

## 2022-08-04 NOTE — Telephone Encounter (Signed)
Pt called and left vm regarding mounjaro rx, pharmacy said rx is on back order and doesn't know when they will have rx back in stock, asked if you can send alternate rx? Please advise

## 2022-08-05 ENCOUNTER — Other Ambulatory Visit: Payer: Self-pay | Admitting: Internal Medicine

## 2022-08-05 DIAGNOSIS — E669 Obesity, unspecified: Secondary | ICD-10-CM

## 2022-08-05 MED ORDER — OZEMPIC (2 MG/DOSE) 8 MG/3ML ~~LOC~~ SOPN: 2.0000 mg | PEN_INJECTOR | SUBCUTANEOUS | 3 refills | Status: DC

## 2022-09-15 ENCOUNTER — Encounter: Payer: Self-pay | Admitting: Internal Medicine

## 2022-09-15 ENCOUNTER — Ambulatory Visit (INDEPENDENT_AMBULATORY_CARE_PROVIDER_SITE_OTHER): Payer: 59 | Admitting: Internal Medicine

## 2022-09-15 VITALS — BP 128/80 | HR 71 | Ht 63.0 in | Wt 177.8 lb

## 2022-09-15 DIAGNOSIS — E782 Mixed hyperlipidemia: Secondary | ICD-10-CM

## 2022-09-15 DIAGNOSIS — D508 Other iron deficiency anemias: Secondary | ICD-10-CM | POA: Diagnosis not present

## 2022-09-15 DIAGNOSIS — I10 Essential (primary) hypertension: Secondary | ICD-10-CM | POA: Diagnosis not present

## 2022-09-15 DIAGNOSIS — R7302 Impaired glucose tolerance (oral): Secondary | ICD-10-CM | POA: Diagnosis not present

## 2022-09-15 MED ORDER — ZEPBOUND 10 MG/0.5ML ~~LOC~~ SOAJ: 10.0000 mg | SUBCUTANEOUS | 2 refills | Status: DC

## 2022-09-15 NOTE — Progress Notes (Addendum)
Established Patient Office Visit  Subjective:  Patient ID: Jill English, female    DOB: 04/18/1966  Age: 57 y.o. MRN: [id number]  Chief Complaint  Patient presents with   Follow-up    4 week follow up    Patient comes in for her follow-up today.  She was taking initially Ozempic 2 mg/week but then her weight loss had become static.  Patient had requested to be switched to [facility] but however she was not able to get that prescription as it is on backorder.  Patient had continued taking Ozempic but reports it has caused constipation although she drinks a lot of water.  At this time she would like to try Zepbound.  Will send in a prescription for that. Patient continues to control her diet and be more physically active.  She has no other complaints today.  Patient is fasting for blood work.    No other concerns at this time.   Past Medical History:  Diagnosis Date   Anxiety    Anxiety    Depression    Diverticulitis Sept 2011   CT documented   GERD (gastroesophageal reflux disease)    Headache    Heart murmur    seen by cardiologist, normal work up   Hemorrhoids    History of hiatal hernia    Hyperlipidemia    Hypertension    moderate obesity    MS (multiple sclerosis)    Narcolepsy    Sleep apnea    uses cpap    Past Surgical History:  Procedure Laterality Date   ABDOMINAL HYSTERECTOMY     BACK SURGERY  2004   BREAST BIOPSY Left 05/29/2022   [place] Core BX Coil Clip - path pending   BREAST BIOPSY Left 05/29/2022   [place] LT BREAST BX W LOC DEV 1ST LESION IMG BX SPEC US GUIDE 05/29/2022 [facility]   carpal tunnel repair  2009   COLONOSCOPY  04/07/2011   [id number] internal hemorrhoids/diverticula in the sigmoid colon   ESOPHAGOGASTRODUODENOSCOPY  04/07/2011   SLF: no evidence of h pylori/gastric body and antral mucosa with minimal chronic inflammation, negative H.pylori   HEMORRHOID SURGERY  07/21/2011   Procedure: HEMORRHOIDECTOMY;  Surgeon: Dalia Heading,  MD;  Location: [facility];  Service: General;  Laterality: N/A;   PARTIAL HYSTERECTOMY  2008   with removal of rt ovary   REDUCTION MAMMAPLASTY Bilateral 03/20/2022   TUBAL LIGATION  1992    Social History   Socioeconomic History   Marital status: Legally Separated    Spouse name: [name]   Number of children: 2   Years of education: college   Highest education level: Not on file  Occupational History    Employer: NOT EMPLOYED  Tobacco Use   Smoking status: Never   Smokeless tobacco: Never  Substance and Sexual Activity   Alcohol use: No   Drug use: No   Sexual activity: Yes  Other Topics Concern   Not on file  Social History Narrative   [name]   Patient has two children.   Patient has a college education.         Social Determinants of Health   Financial Resource Strain: Not on file  Food Insecurity: Not on file  Transportation Needs: Not on file  Physical Activity: Not on file  Stress: Not on file  Social Connections: Not on file  Intimate Partner Violence: Not on file    Family History  Problem  Relation Age of Onset   Heart disease Father    Diabetes Father    Cancer Father        prostate   Multiple sclerosis Sister    Diabetes Sister    Hyperlipidemia Sister    Cancer Sister    Cancer Mother        lung   Hypertension Mother    Hyperlipidemia Brother    Other Brother        rare blood disorder   Breast cancer Paternal Aunt    Colon cancer Neg Hx    Anesthesia problems Neg Hx    Hypotension Neg Hx    Malignant hyperthermia Neg Hx    Pseudochol deficiency Neg Hx     Allergies  Allergen Reactions   Contrast Media [Iodinated Contrast Media] Swelling    Facial swelling   Gadolinium Derivatives Itching and Rash    Patient had received contrast before without any adverse effects. About 15 minutes after injection patient began to itch and showed signs of facial rash/swelling for several minutes.  Radiologist examined and cleared patient, but suggested premedication for any future contrast injections.    Latex Rash    Review of Systems  Constitutional:  Negative for chills, diaphoresis, fever, malaise/fatigue and weight loss.  HENT:  Negative for ear discharge, ear pain, hearing loss, nosebleeds and tinnitus.   Eyes: Negative.   Respiratory: Negative.    Cardiovascular: Negative.   Gastrointestinal:  Positive for constipation. Negative for abdominal pain, blood in stool, diarrhea, heartburn, melena, nausea and vomiting.  Genitourinary: Negative.   Musculoskeletal: Negative.   Neurological: Negative.   Endo/Heme/Allergies: Negative.   Psychiatric/Behavioral: Negative.         Objective:   BP 128/80   Pulse 71   Ht 5\' 3"  (1.6 m)   Wt 177 lb 12.8 oz (80.6 kg)   SpO2 98%   BMI 31.50 kg/m   Vitals:   09/15/22 0909  BP: 128/80  Pulse: 71  Height: 5\' 3"  (1.6 m)  Weight: 177 lb 12.8 oz (80.6 kg)  SpO2: 98%  BMI (Calculated): 31.5    Physical Exam Vitals and nursing note reviewed.  Cardiovascular:     Rate and Rhythm: Normal rate and regular rhythm.     Pulses: Normal pulses.     Heart sounds: Normal heart sounds.  Pulmonary:     Effort: Pulmonary effort is normal.     Breath sounds: Normal breath sounds.  Abdominal:     General: Bowel sounds are normal.     Palpations: Abdomen is soft.  Musculoskeletal:        General: Normal range of motion.     Cervical back: Normal range of motion and neck supple.  Neurological:     General: No focal deficit present.     Mental Status: She is alert and oriented to person, place, and time.  Psychiatric:        Mood and Affect: Mood normal.        Behavior: Behavior normal.      No results found for any visits on 09/15/22.  No results found for this or any previous visit (from the past 2160 hour(s)).    Assessment & Plan:  Patient advised to continue taking current medications regularly.  She will continue a  strict diet control and physical activity.  Will try sending a prescription for Septra bound. Problem List Items Addressed This Visit     Impaired glucose tolerance - Primary   Relevant  Medications   tirzepatide (ZEPBOUND) 10 MG/0.5ML Pen   Other Relevant Orders   Hemoglobin A1c   Essential hypertension, benign   Relevant Orders   CMP14+EGFR   Mixed hyperlipidemia   Relevant Orders   Lipid Panel w/o Chol/HDL Ratio   Iron deficiency anemia secondary to inadequate dietary iron intake   Relevant Orders   CBC With Differential    Return in about 4 weeks (around 10/13/2022).   Total time spent: 20 minutes  Margaretann Loveless, MD  09/15/2022

## 2022-09-16 LAB — CBC WITH DIFFERENTIAL
Basophils Absolute: 0 10*3/uL (ref 0.0–0.2)
Basos: 1 %
EOS (ABSOLUTE): 0.1 10*3/uL (ref 0.0–0.4)
Eos: 3 %
Hematocrit: 42.8 % (ref 34.0–46.6)
Hemoglobin: 13.9 g/dL (ref 11.1–15.9)
Immature Grans (Abs): 0 10*3/uL (ref 0.0–0.1)
Immature Granulocytes: 0 %
Lymphocytes Absolute: 2.3 10*3/uL (ref 0.7–3.1)
Lymphs: 52 %
MCH: 26.5 pg — ABNORMAL LOW (ref 26.6–33.0)
MCHC: 32.5 g/dL (ref 31.5–35.7)
MCV: 82 fL (ref 79–97)
Monocytes Absolute: 0.4 10*3/uL (ref 0.1–0.9)
Monocytes: 8 %
Neutrophils Absolute: 1.6 10*3/uL (ref 1.4–7.0)
Neutrophils: 36 %
RBC: 5.24 x10E6/uL (ref 3.77–5.28)
RDW: 14 % (ref 11.7–15.4)
WBC: 4.4 10*3/uL (ref 3.4–10.8)

## 2022-09-16 LAB — CMP14+EGFR
ALT: 25 IU/L (ref 0–32)
AST: 20 IU/L (ref 0–40)
Albumin/Globulin Ratio: 1.5 (ref 1.2–2.2)
Albumin: 4.1 g/dL (ref 3.8–4.9)
Alkaline Phosphatase: 80 IU/L (ref 44–121)
BUN/Creatinine Ratio: 12 (ref 9–23)
BUN: 10 mg/dL (ref 6–24)
Bilirubin Total: 0.2 mg/dL (ref 0.0–1.2)
CO2: 23 mmol/L (ref 20–29)
Calcium: 9.3 mg/dL (ref 8.7–10.2)
Chloride: 104 mmol/L (ref 96–106)
Creatinine, Ser: 0.81 mg/dL (ref 0.57–1.00)
Globulin, Total: 2.8 g/dL (ref 1.5–4.5)
Glucose: 70 mg/dL (ref 70–99)
Potassium: 4.4 mmol/L (ref 3.5–5.2)
Sodium: 143 mmol/L (ref 134–144)
Total Protein: 6.9 g/dL (ref 6.0–8.5)
eGFR: 86 mL/min/{1.73_m2} (ref >59)

## 2022-09-16 LAB — HEMOGLOBIN A1C
Est. average glucose Bld gHb Est-mCnc: 126 mg/dL
Hgb A1c MFr Bld: 6 % — ABNORMAL HIGH (ref 4.8–5.6)

## 2022-09-16 LAB — LIPID PANEL W/O CHOL/HDL RATIO
Cholesterol, Total: 244 mg/dL — ABNORMAL HIGH (ref 100–199)
HDL: 66 mg/dL (ref >39)
LDL Chol Calc (NIH): 162 mg/dL — ABNORMAL HIGH (ref 0–99)
Triglycerides: 93 mg/dL (ref 0–149)
VLDL Cholesterol Cal: 16 mg/dL (ref 5–40)

## 2022-09-19 NOTE — Progress Notes (Signed)
Lm for pt to call back

## 2022-09-23 ENCOUNTER — Other Ambulatory Visit: Payer: Self-pay | Admitting: Internal Medicine

## 2022-09-23 ENCOUNTER — Telehealth: Payer: Self-pay

## 2022-09-23 DIAGNOSIS — R7302 Impaired glucose tolerance (oral): Secondary | ICD-10-CM

## 2022-09-23 MED ORDER — TRULICITY 1.5 MG/0.5ML ~~LOC~~ SOAJ: 1.5000 mg | SUBCUTANEOUS | 0 refills | Status: DC

## 2022-09-23 NOTE — Telephone Encounter (Signed)
Gave pt her lab results, and she said rx zepbound was denied by insurance & she asked if you can send rx trulicity instead for her? Please advise

## 2022-09-23 NOTE — Progress Notes (Signed)
Pt informed, she is taking cholesterol rx

## 2022-10-10 DIAGNOSIS — R7302 Impaired glucose tolerance (oral): Secondary | ICD-10-CM

## 2022-10-10 NOTE — Progress Notes (Signed)
[facility] Customer service manager [facility] Quality Pharmacy Team Statin Quality Measure Assessment   10/10/2022  Jill English Oct 14, 1965 [id number]  Per review of chart and payor information, patient has multiple fills of an anti-diabetes medication (Ozempic), which places patient into the Statin Use In Patients with Diabetes (SUPD) measure for CMS.    Since the patient does not have a diabetes diagnosis, and has an ASCVD score < 7.5%, the patient can be excluded from the SUPD measure with a qualifying exclusion code to remove the patient from the SUPD quality measure.   The 10-year ASCVD risk score (Arnett DK, et al., 2019) is: 4.6%   Values used to calculate the score:     Age: 57 years     Sex: Female     Is Non-Hispanic African American: Yes     Diabetic: No     Tobacco smoker: No     Systolic Blood Pressure: 128 mmHg     Is BP treated: Yes     HDL Cholesterol: 66 mg/dL     Total Cholesterol: 244 mg/dL 09/01/8117     Component Value Date/Time   CHOL 244 (H) 09/15/2022 0957   TRIG 93 09/15/2022 0957   HDL 66 09/15/2022 0957   LDLCALC 162 (H) 09/15/2022 0957    Please consider ONE of the following recommendations:  Initiate moderate intensity statin Atorvastatin 10 mg once daily, #90, 3 refills   Rosuvastatin 5 mg once daily, #90, 3 refills    Initiate low intensity          statin with reduced frequency if prior          statin intolerance 1x weekly, #13, 3 refills   2x weekly, #26, 3 refills   3x weekly, #39, 3 refills    Code for past statin intolerance or  other exclusions (required annually)  Provider Requirements: Associate code during an office visit or telehealth encounter  Drug Induced Myopathy G72.0   Myopathy, unspecified G72.9   Myositis, unspecified M60.9   Rhabdomyolysis M62.82   Abnormal blood glucose, elevated hemoglobin A1c R73.09   Prediabetes R73.03   PCOS E28.2   Thank you for allowing [facility] pharmacy to be a part of this patient's care.  Harlon Flor, PharmD Clinical Pharmacist  [facility] [employer] [phone]

## 2022-10-13 ENCOUNTER — Encounter: Payer: Self-pay | Admitting: Internal Medicine

## 2022-10-13 ENCOUNTER — Ambulatory Visit (INDEPENDENT_AMBULATORY_CARE_PROVIDER_SITE_OTHER): Payer: 59 | Admitting: Internal Medicine

## 2022-10-13 VITALS — BP 140/78 | HR 76 | Ht 63.0 in | Wt 177.4 lb

## 2022-10-13 DIAGNOSIS — E669 Obesity, unspecified: Secondary | ICD-10-CM

## 2022-10-13 DIAGNOSIS — R7303 Prediabetes: Secondary | ICD-10-CM | POA: Insufficient documentation

## 2022-10-13 DIAGNOSIS — I10 Essential (primary) hypertension: Secondary | ICD-10-CM

## 2022-10-13 DIAGNOSIS — E782 Mixed hyperlipidemia: Secondary | ICD-10-CM

## 2022-10-13 MED ORDER — TRULICITY 3 MG/0.5ML ~~LOC~~ SOAJ: 3.0000 mg | SUBCUTANEOUS | 1 refills | Status: DC

## 2022-10-13 MED ORDER — LIVALO 2 MG PO TABS: 1.0000 | ORAL_TABLET | Freq: Every day | ORAL | 6 refills | Status: DC

## 2022-10-13 NOTE — Progress Notes (Signed)
Established Patient Office Visit  Subjective:  Patient ID: Jill English, female    DOB: 1966/05/08  Age: 57 y.o. MRN: [id number]  Chief Complaint  Patient presents with   Follow-up    4 week follow up    Patient comes in for her follow-up.  She was recently started on Trulicity as she was not able to tolerate Ozempic.  It was causing constipation.  Patient was switched to [facility] but then it was on backorder.  She was given a prescription for stepdown but it was not approved by her insurance.  However they approve Trulicity of which she has only taken 1 dose yet but she says she did not feel constipation or any other discomfort.  She is agreeable to continue using Trulicity. Patient is aware of her high LDL and she is able to use Livalo, but she has not picked up the new prescription yet.  Patient advised to pick up Livalo and start taking it regularly since she does  not cause her muscle aches and pains.    No other concerns at this time.   Past Medical History:  Diagnosis Date   Anxiety    Anxiety    Depression    Diverticulitis Sept 2011   CT documented   GERD (gastroesophageal reflux disease)    Headache    Heart murmur    seen by cardiologist, normal work up   Hemorrhoids    History of hiatal hernia    Hyperlipidemia    Hypertension    moderate obesity    MS (multiple sclerosis) (HCC)    Narcolepsy    Sleep apnea    uses cpap    Past Surgical History:  Procedure Laterality Date   ABDOMINAL HYSTERECTOMY     BACK SURGERY  2004   BREAST BIOPSY Left 05/29/2022   [place] Core BX Coil Clip - path pending   BREAST BIOPSY Left 05/29/2022   [place] LT BREAST BX W LOC DEV 1ST LESION IMG BX SPEC US GUIDE 05/29/2022 [facility]   carpal tunnel repair  2009   COLONOSCOPY  04/07/2011   WUJ:WJXBJ internal hemorrhoids/diverticula in the sigmoid colon   ESOPHAGOGASTRODUODENOSCOPY  04/07/2011   SLF: no evidence of h pylori/gastric body and antral mucosa with minimal chronic  inflammation, negative H.pylori   HEMORRHOID SURGERY  07/21/2011   Procedure: HEMORRHOIDECTOMY;  Surgeon: Dalia Heading, MD;  Location: [facility];  Service: General;  Laterality: N/A;   PARTIAL HYSTERECTOMY  2008   with removal of rt ovary   REDUCTION MAMMAPLASTY Bilateral 03/20/2022   TUBAL LIGATION  1992    Social History   Socioeconomic History   Marital status: Legally Separated    Spouse name: [name]   Number of children: 2   Years of education: college   Highest education level: Not on file  Occupational History    Employer: NOT EMPLOYED  Tobacco Use   Smoking status: Never   Smokeless tobacco: Never  Substance and Sexual Activity   Alcohol use: No   Drug use: No   Sexual activity: Yes  Other Topics Concern   Not on file  Social History Narrative   [name]   Patient has two children.   Patient has a college education.         Social Determinants of Health   Financial Resource Strain: Not on file  Food Insecurity: Not on file  Transportation Needs: Not on file  Physical Activity: Not  on file  Stress: Not on file  Social Connections: Not on file  Intimate Partner Violence: Not on file    Family History  Problem Relation Age of Onset   Heart disease Father    Diabetes Father    Cancer Father        prostate   Multiple sclerosis Sister    Diabetes Sister    Hyperlipidemia Sister    Cancer Sister    Cancer Mother        lung   Hypertension Mother    Hyperlipidemia Brother    Other Brother        rare blood disorder   Breast cancer Paternal Aunt    Colon cancer Neg Hx    Anesthesia problems Neg Hx    Hypotension Neg Hx    Malignant hyperthermia Neg Hx    Pseudochol deficiency Neg Hx     Allergies  Allergen Reactions   Contrast Media [Iodinated Contrast Media] Swelling    Facial swelling   Gadolinium Derivatives Itching and Rash    Patient had received contrast before without any adverse effects.  About 15 minutes after injection patient began to itch and showed signs of facial rash/swelling for several minutes. Radiologist examined and cleared patient, but suggested premedication for any future contrast injections.    Latex Rash    Review of Systems  Constitutional:  Negative for chills, diaphoresis, fever, malaise/fatigue and weight loss.  HENT: Negative.    Eyes: Negative.   Respiratory:  Negative for cough, shortness of breath and wheezing.   Cardiovascular:  Negative for chest pain, palpitations, leg swelling and PND.  Gastrointestinal:  Negative for abdominal pain, blood in stool, constipation, diarrhea, heartburn, melena, nausea and vomiting.  Genitourinary: Negative.   Musculoskeletal:  Negative for back pain, falls, joint pain, myalgias and neck pain.  Skin: Negative.   Neurological: Negative.   Psychiatric/Behavioral:  Negative for depression, hallucinations, substance abuse and suicidal ideas. The patient is not nervous/anxious and does not have insomnia.        Objective:   BP (!) 140/78   Pulse 76   Ht 5\' 3"  (1.6 m)   Wt 177 lb 6.4 oz (80.5 kg)   SpO2 99%   BMI 31.42 kg/m   Vitals:   10/13/22 1010  BP: (!) 140/78  Pulse: 76  Height: 5\' 3"  (1.6 m)  Weight: 177 lb 6.4 oz (80.5 kg)  SpO2: 99%  BMI (Calculated): 31.43    Physical Exam Vitals and nursing note reviewed.  Constitutional:      Appearance: Normal appearance. She is obese.  Cardiovascular:     Rate and Rhythm: Normal rate and regular rhythm.     Pulses: Normal pulses.     Heart sounds: Normal heart sounds. No murmur heard. Pulmonary:     Effort: Pulmonary effort is normal.     Breath sounds: Normal breath sounds. No wheezing or rales.  Abdominal:     General: Bowel sounds are normal. There is no distension.     Palpations: Abdomen is soft. There is no mass.     Tenderness: There is no right CVA tenderness, left CVA tenderness or guarding.  Musculoskeletal:        General: No swelling  or deformity. Normal range of motion.     Cervical back: Normal range of motion and neck supple.     Right lower leg: No edema.     Left lower leg: No edema.  Skin:    General: Skin  is warm and dry.  Neurological:     General: No focal deficit present.     Mental Status: She is alert and oriented to person, place, and time.  Psychiatric:        Mood and Affect: Mood normal.        Behavior: Behavior normal.      No results found for any visits on 10/13/22.  Recent Results (from the past 2160 hour(s))  CBC With Differential     Status: Abnormal   Collection Time: 09/15/22  9:57 AM  Result Value Ref Range   WBC 4.4 3.4 - 10.8 x10E3/uL   RBC 5.24 3.77 - 5.28 x10E6/uL   Hemoglobin 13.9 11.1 - 15.9 g/dL   Hematocrit 13.2 44.0 - 46.6 %   MCV 82 79 - 97 fL   MCH 26.5 (L) 26.6 - 33.0 pg   MCHC 32.5 31.5 - 35.7 g/dL   RDW 10.2 72.5 - 36.6 %   Neutrophils 36 Not Estab. %   Lymphs 52 Not Estab. %   Monocytes 8 Not Estab. %   Eos 3 Not Estab. %   Basos 1 Not Estab. %   Neutrophils Absolute 1.6 1.4 - 7.0 x10E3/uL   Lymphocytes Absolute 2.3 0.7 - 3.1 x10E3/uL   Monocytes Absolute 0.4 0.1 - 0.9 x10E3/uL   EOS (ABSOLUTE) 0.1 0.0 - 0.4 x10E3/uL   Basophils Absolute 0.0 0.0 - 0.2 x10E3/uL   Immature Granulocytes 0 Not Estab. %   Immature Grans (Abs) 0.0 0.0 - 0.1 x10E3/uL  CMP14+EGFR     Status: None   Collection Time: 09/15/22  9:57 AM  Result Value Ref Range   Glucose 70 70 - 99 mg/dL   BUN 10 6 - 24 mg/dL   Creatinine, Ser 4.40 0.57 - 1.00 mg/dL   eGFR 86 >34 VQ/QVZ/5.63   BUN/Creatinine Ratio 12 9 - 23   Sodium 143 134 - 144 mmol/L   Potassium 4.4 3.5 - 5.2 mmol/L   Chloride 104 96 - 106 mmol/L   CO2 23 20 - 29 mmol/L   Calcium 9.3 8.7 - 10.2 mg/dL   Total Protein 6.9 6.0 - 8.5 g/dL   Albumin 4.1 3.8 - 4.9 g/dL   Globulin, Total 2.8 1.5 - 4.5 g/dL   Albumin/Globulin Ratio 1.5 1.2 - 2.2   Bilirubin Total 0.2 0.0 - 1.2 mg/dL   Alkaline Phosphatase 80 44 - 121 IU/L   AST  20 0 - 40 IU/L   ALT 25 0 - 32 IU/L  Lipid Panel w/o Chol/HDL Ratio     Status: Abnormal   Collection Time: 09/15/22  9:57 AM  Result Value Ref Range   Cholesterol, Total 244 (H) 100 - 199 mg/dL   Triglycerides 93 0 - 149 mg/dL   HDL 66 >87 mg/dL   VLDL Cholesterol Cal 16 5 - 40 mg/dL   LDL Chol Calc (NIH) 564 (H) 0 - 99 mg/dL  Hemoglobin P3I     Status: Abnormal   Collection Time: 09/15/22  9:57 AM  Result Value Ref Range   Hgb A1c MFr Bld 6.0 (H) 4.8 - 5.6 %    Comment:          Prediabetes: 5.7 - 6.4          Diabetes: >6.4          Glycemic control for adults with diabetes: <7.0    Est. average glucose Bld gHb Est-mCnc 126 mg/dL      Assessment & Plan:  Patient to continue using Trulicity.  Will send in the higher dose after the completion of the current dose.  Patient also advised to take her Livalo regularly. Problem List Items Addressed This Visit     Obesity (BMI 30.0-34.9)   Essential hypertension, benign   Relevant Medications   LIVALO 2 MG TABS   Mixed hyperlipidemia   Relevant Medications   LIVALO 2 MG TABS   Prediabetes - Primary   Relevant Medications   Dulaglutide (TRULICITY) 3 MG/0.5ML SOPN    Return in about 6 weeks (around 11/24/2022).   Total time spent: 25 minutes  Margaretann Loveless, MD  10/13/2022   This document may have been prepared by [facility] Voice Recognition software and as such may include unintentional dictation errors.

## 2022-10-15 DIAGNOSIS — G4733 Obstructive sleep apnea (adult) (pediatric): Secondary | ICD-10-CM | POA: Diagnosis not present

## 2022-12-14 ENCOUNTER — Other Ambulatory Visit: Payer: Self-pay | Admitting: Internal Medicine

## 2023-01-08 DIAGNOSIS — G4733 Obstructive sleep apnea (adult) (pediatric): Secondary | ICD-10-CM | POA: Diagnosis not present

## 2023-01-12 ENCOUNTER — Other Ambulatory Visit: Payer: Self-pay | Admitting: Internal Medicine

## 2023-01-12 DIAGNOSIS — R7303 Prediabetes: Secondary | ICD-10-CM

## 2023-01-19 ENCOUNTER — Ambulatory Visit: Payer: 59 | Admitting: Internal Medicine

## 2023-02-16 ENCOUNTER — Encounter: Payer: Self-pay | Admitting: Internal Medicine

## 2023-02-16 ENCOUNTER — Ambulatory Visit (INDEPENDENT_AMBULATORY_CARE_PROVIDER_SITE_OTHER): Payer: 59 | Admitting: Internal Medicine

## 2023-02-16 VITALS — BP 140/86 | HR 72 | Ht 63.0 in | Wt 193.0 lb

## 2023-02-16 DIAGNOSIS — R7303 Prediabetes: Secondary | ICD-10-CM

## 2023-02-16 DIAGNOSIS — D508 Other iron deficiency anemias: Secondary | ICD-10-CM | POA: Diagnosis not present

## 2023-02-16 DIAGNOSIS — E782 Mixed hyperlipidemia: Secondary | ICD-10-CM

## 2023-02-16 DIAGNOSIS — I10 Essential (primary) hypertension: Secondary | ICD-10-CM

## 2023-02-16 MED ORDER — OZEMPIC (1 MG/DOSE) 4 MG/3ML ~~LOC~~ SOPN: 1.0000 mg | PEN_INJECTOR | SUBCUTANEOUS | 3 refills | Status: DC

## 2023-02-16 NOTE — Progress Notes (Signed)
Established Patient Office Visit  Subjective:  Patient ID: Jill English, female    DOB: 14-Aug-1965  Age: 58 y.o. MRN: [id number]  Chief Complaint  Patient presents with   Follow-up    4 month follow up    Patient comes in for her follow-up.  She missed her last appointment due to some family issues.  Her blood pressure is high as well. Today reports that she stopped using her Trulicity about 2 weeks ago but it was not helping her .  Would like to resume her Ozempic injections. Fasting for blood work today.    No other concerns at this time.   Past Medical History:  Diagnosis Date   Anxiety    Anxiety    Depression    Diverticulitis Sept 2011   CT documented   GERD (gastroesophageal reflux disease)    Headache    Heart murmur    seen by cardiologist, normal work up   Hemorrhoids    History of hiatal hernia    Hyperlipidemia    Hypertension    moderate obesity    MS (multiple sclerosis) (HCC)    Narcolepsy    Sleep apnea    uses cpap    Past Surgical History:  Procedure Laterality Date   ABDOMINAL HYSTERECTOMY     BACK SURGERY  2004   BREAST BIOPSY Left 05/29/2022   [place] Core BX Coil Clip - path pending   BREAST BIOPSY Left 05/29/2022   [place] LT BREAST BX W LOC DEV 1ST LESION IMG BX SPEC US GUIDE 05/29/2022 [facility]   carpal tunnel repair  2009   COLONOSCOPY  04/07/2011   GEX:BMWUX internal hemorrhoids/diverticula in the sigmoid colon   ESOPHAGOGASTRODUODENOSCOPY  04/07/2011   SLF: no evidence of h pylori/gastric body and antral mucosa with minimal chronic inflammation, negative H.pylori   HEMORRHOID SURGERY  07/21/2011   Procedure: HEMORRHOIDECTOMY;  Surgeon: Dalia Heading, MD;  Location: [facility];  Service: General;  Laterality: N/A;   PARTIAL HYSTERECTOMY  2008   with removal of rt ovary   REDUCTION MAMMAPLASTY Bilateral 03/20/2022   TUBAL LIGATION  1992    Social History   Socioeconomic History   Marital status: Legally Separated    Spouse  name: Jill English   Number of children: 2   Years of education: college   Highest education level: Not on file  Occupational History    Employer: NOT EMPLOYED  Tobacco Use   Smoking status: Never   Smokeless tobacco: Never  Substance and Sexual Activity   Alcohol use: No   Drug use: No   Sexual activity: Yes  Other Topics Concern   Not on file  Social History Narrative   Patient is married Jill English) and lives with her husband.   Patient has two children.   Patient has a college education.         Social Determinants of Health   Financial Resource Strain: Not on file  Food Insecurity: Not on file  Transportation Needs: Not on file  Physical Activity: Not on file  Stress: Not on file  Social Connections: Unknown (10/06/2021)   [facility]   Social Network    Social Network: Not on file  Intimate Partner Violence: Unknown (08/28/2021)   Received from [facility], [facility]   HITS    Physically Hurt: Not on file    Insult or Talk Down To: Not on file    Threaten Physical Harm: Not on file  Scream or Curse: Not on file    Family History  Problem Relation Age of Onset   Heart disease Father    Diabetes Father    Cancer Father        prostate   Multiple sclerosis Sister    Diabetes Sister    Hyperlipidemia Sister    Cancer Sister    Cancer Mother        lung   Hypertension Mother    Hyperlipidemia Brother    Other Brother        rare blood disorder   Breast cancer Paternal Aunt    Colon cancer Neg Hx    Anesthesia problems Neg Hx    Hypotension Neg Hx    Malignant hyperthermia Neg Hx    Pseudochol deficiency Neg Hx     Allergies  Allergen Reactions   Contrast Media [Iodinated Contrast Media] Swelling    Facial swelling   Gadolinium Derivatives Itching and Rash    Patient had received contrast before without any adverse effects. About 15 minutes after injection patient began to itch and showed signs of facial rash/swelling for  several minutes. Radiologist examined and cleared patient, but suggested premedication for any future contrast injections.    Latex Rash    Review of Systems  Constitutional: Negative.  Negative for chills, diaphoresis, fever, malaise/fatigue and weight loss.  HENT: Negative.  Negative for sinus pain and sore throat.   Eyes: Negative.   Respiratory: Negative.  Negative for cough and shortness of breath.   Cardiovascular: Negative.  Negative for chest pain, palpitations and leg swelling.  Gastrointestinal: Negative.  Negative for abdominal pain, constipation, diarrhea, heartburn, nausea and vomiting.  Genitourinary: Negative.  Negative for dysuria and flank pain.  Musculoskeletal: Negative.  Negative for joint pain and myalgias.  Skin: Negative.   Neurological: Negative.  Negative for dizziness and headaches.  Endo/Heme/Allergies: Negative.   Psychiatric/Behavioral: Negative.  Negative for depression and suicidal ideas. The patient is not nervous/anxious.        Objective:   BP (!) 140/86   Pulse 72   Ht 5\' 3"  (1.6 m)   Wt 193 lb (87.5 kg)   SpO2 98%   BMI 34.19 kg/m   Vitals:   02/16/23 1002  BP: (!) 140/86  Pulse: 72  Height: 5\' 3"  (1.6 m)  Weight: 193 lb (87.5 kg)  SpO2: 98%  BMI (Calculated): 34.2    Physical Exam Vitals and nursing note reviewed.  Constitutional:      Appearance: Normal appearance.  HENT:     Head: Normocephalic and atraumatic.     Nose: Nose normal.     Mouth/Throat:     Mouth: Mucous membranes are moist.     Pharynx: Oropharynx is clear.  Eyes:     Conjunctiva/sclera: Conjunctivae normal.     Pupils: Pupils are equal, round, and reactive to light.  Cardiovascular:     Rate and Rhythm: Normal rate and regular rhythm.     Pulses: Normal pulses.     Heart sounds: Normal heart sounds. No murmur heard. Pulmonary:     Effort: Pulmonary effort is normal.     Breath sounds: Normal breath sounds. No wheezing.  Abdominal:     General: Bowel  sounds are normal.     Palpations: Abdomen is soft.     Tenderness: There is no abdominal tenderness. There is no right CVA tenderness or left CVA tenderness.  Musculoskeletal:        General: Normal range of  motion.     Cervical back: Normal range of motion.     Right lower leg: No edema.     Left lower leg: No edema.  Skin:    General: Skin is warm and dry.  Neurological:     General: No focal deficit present.     Mental Status: She is alert and oriented to person, place, and time.  Psychiatric:        Mood and Affect: Mood normal.        Behavior: Behavior normal.      No results found for any visits on 02/16/23.  No results found for this or any previous visit (from the past 2160 hour(s)).    Assessment & Plan:  Resume Ozempic.  Strict diet control.  Check labs today.  Monitor blood pressure. Problem List Items Addressed This Visit     Essential hypertension, benign   Relevant Orders   CMP14+EGFR   Mixed hyperlipidemia   Relevant Orders   Lipid Panel w/o Chol/HDL Ratio   Iron deficiency anemia secondary to inadequate dietary iron intake   Relevant Orders   CBC with Diff   Prediabetes - Primary   Relevant Medications   Semaglutide, 1 MG/DOSE, (OZEMPIC, 1 MG/DOSE,) 4 MG/3ML SOPN   Other Relevant Orders   Hemoglobin A1c    Return in about 3 weeks (around 03/09/2023).   Total time spent: 30 minutes  Margaretann Loveless, MD  02/16/2023   This document may have been prepared by [facility] Voice Recognition software and as such may include unintentional dictation errors.

## 2023-02-17 LAB — CMP14+EGFR
ALT: 20 IU/L (ref 0–32)
AST: 22 IU/L (ref 0–40)
Albumin: 4.2 g/dL (ref 3.8–4.9)
Alkaline Phosphatase: 101 IU/L (ref 44–121)
BUN/Creatinine Ratio: 15 (ref 9–23)
BUN: 13 mg/dL (ref 6–24)
Bilirubin Total: 0.3 mg/dL (ref 0.0–1.2)
CO2: 25 mmol/L (ref 20–29)
Calcium: 9.3 mg/dL (ref 8.7–10.2)
Chloride: 104 mmol/L (ref 96–106)
Creatinine, Ser: 0.89 mg/dL (ref 0.57–1.00)
Globulin, Total: 3 g/dL (ref 1.5–4.5)
Glucose: 92 mg/dL (ref 70–99)
Potassium: 4.5 mmol/L (ref 3.5–5.2)
Sodium: 142 mmol/L (ref 134–144)
Total Protein: 7.2 g/dL (ref 6.0–8.5)
eGFR: 77 mL/min/{1.73_m2} (ref >59)

## 2023-02-17 LAB — CBC WITH DIFFERENTIAL/PLATELET
Basophils Absolute: 0 10*3/uL (ref 0.0–0.2)
Basos: 0 %
EOS (ABSOLUTE): 0.1 10*3/uL (ref 0.0–0.4)
Eos: 2 %
Hematocrit: 43.9 % (ref 34.0–46.6)
Hemoglobin: 13.9 g/dL (ref 11.1–15.9)
Immature Grans (Abs): 0 10*3/uL (ref 0.0–0.1)
Immature Granulocytes: 0 %
Lymphocytes Absolute: 2.1 10*3/uL (ref 0.7–3.1)
Lymphs: 48 %
MCH: 25.7 pg — ABNORMAL LOW (ref 26.6–33.0)
MCHC: 31.7 g/dL (ref 31.5–35.7)
MCV: 81 fL (ref 79–97)
Monocytes Absolute: 0.4 10*3/uL (ref 0.1–0.9)
Monocytes: 9 %
Neutrophils Absolute: 1.8 10*3/uL (ref 1.4–7.0)
Neutrophils: 41 %
Platelets: 283 10*3/uL (ref 150–450)
RBC: 5.41 x10E6/uL — ABNORMAL HIGH (ref 3.77–5.28)
RDW: 13.9 % (ref 11.7–15.4)
WBC: 4.5 10*3/uL (ref 3.4–10.8)

## 2023-02-17 LAB — LIPID PANEL W/O CHOL/HDL RATIO
Cholesterol, Total: 268 mg/dL — ABNORMAL HIGH (ref 100–199)
HDL: 71 mg/dL (ref >39)
LDL Chol Calc (NIH): 181 mg/dL — ABNORMAL HIGH (ref 0–99)
Triglycerides: 96 mg/dL (ref 0–149)
VLDL Cholesterol Cal: 16 mg/dL (ref 5–40)

## 2023-02-17 LAB — HEMOGLOBIN A1C
Est. average glucose Bld gHb Est-mCnc: 123 mg/dL
Hgb A1c MFr Bld: 5.9 % — ABNORMAL HIGH (ref 4.8–5.6)

## 2023-03-09 ENCOUNTER — Ambulatory Visit: Payer: 59 | Admitting: Internal Medicine

## 2023-03-09 DIAGNOSIS — M4802 Spinal stenosis, cervical region: Secondary | ICD-10-CM | POA: Diagnosis not present

## 2023-03-09 DIAGNOSIS — M5410 Radiculopathy, site unspecified: Secondary | ICD-10-CM | POA: Diagnosis not present

## 2023-03-09 DIAGNOSIS — G35 Multiple sclerosis: Secondary | ICD-10-CM | POA: Diagnosis not present

## 2023-03-10 NOTE — Addendum Note (Signed)
Addended by: Margaretann Loveless on: 03/10/2023 11:30 AM   Modules accepted: Level of Service

## 2023-03-16 ENCOUNTER — Ambulatory Visit (INDEPENDENT_AMBULATORY_CARE_PROVIDER_SITE_OTHER): Payer: 59 | Admitting: Internal Medicine

## 2023-03-16 ENCOUNTER — Encounter: Payer: Self-pay | Admitting: Internal Medicine

## 2023-03-16 VITALS — BP 130/80 | HR 82 | Ht 63.0 in | Wt 191.0 lb

## 2023-03-16 DIAGNOSIS — R7303 Prediabetes: Secondary | ICD-10-CM

## 2023-03-16 DIAGNOSIS — I10 Essential (primary) hypertension: Secondary | ICD-10-CM | POA: Diagnosis not present

## 2023-03-16 DIAGNOSIS — R300 Dysuria: Secondary | ICD-10-CM

## 2023-03-16 DIAGNOSIS — E782 Mixed hyperlipidemia: Secondary | ICD-10-CM | POA: Diagnosis not present

## 2023-03-16 DIAGNOSIS — K581 Irritable bowel syndrome with constipation: Secondary | ICD-10-CM

## 2023-03-16 LAB — POCT URINALYSIS DIPSTICK
Bilirubin, UA: NEGATIVE
Bilirubin, UA: NEGATIVE
Glucose, UA: NEGATIVE
Glucose, UA: NEGATIVE
Ketones, UA: NEGATIVE
Ketones, UA: NEGATIVE
Leukocytes, UA: NEGATIVE
Leukocytes, UA: NEGATIVE
Nitrite, UA: NEGATIVE
Nitrite, UA: NEGATIVE
Protein, UA: NEGATIVE
Protein, UA: NEGATIVE
Spec Grav, UA: 1.02 (ref 1.010–1.025)
Spec Grav, UA: 1.02 (ref 1.010–1.025)
Urobilinogen, UA: 0.2 U/dL
Urobilinogen, UA: 0.2 U/dL
pH, UA: 7 (ref 5.0–8.0)
pH, UA: 7 (ref 5.0–8.0)

## 2023-03-16 MED ORDER — LINACLOTIDE 145 MCG PO CAPS: 145.0000 ug | ORAL_CAPSULE | Freq: Every day | ORAL | 6 refills | Status: AC

## 2023-03-16 NOTE — Progress Notes (Signed)
Established Patient Office Visit  Subjective:  Patient ID: Jill English, female    DOB: 1966-01-30  Age: 57 y.o. MRN: [id number]  Chief Complaint  Patient presents with   Follow-up    3 week f/u    Patient comes in for her follow-up today.  She is currently on Ozempic and tolerating it well. Mentions mild constipation and lower abdominal discomfort.  She has history of IBS with constipation and would like to resume her prescription for Linzess.  No nausea or vomiting, and no fevers or chills. Her urine dipstick is unremarkable. Labs discussed, needs to resume her statin.    No other concerns at this time.   Past Medical History:  Diagnosis Date   Anxiety    Anxiety    Depression    Diverticulitis Sept 2011   CT documented   GERD (gastroesophageal reflux disease)    Headache    Heart murmur    seen by cardiologist, normal work up   Hemorrhoids    History of hiatal hernia    Hyperlipidemia    Hypertension    moderate obesity    MS (multiple sclerosis) (HCC)    Narcolepsy    Sleep apnea    uses cpap    Past Surgical History:  Procedure Laterality Date   ABDOMINAL HYSTERECTOMY     BACK SURGERY  2004   BREAST BIOPSY Left 05/29/2022   [place] Core BX Coil Clip - path pending   BREAST BIOPSY Left 05/29/2022   [place] LT BREAST BX W LOC DEV 1ST LESION IMG BX SPEC US GUIDE 05/29/2022 [facility]   carpal tunnel repair  2009   COLONOSCOPY  04/07/2011   VZD:GLOVF internal hemorrhoids/diverticula in the sigmoid colon   ESOPHAGOGASTRODUODENOSCOPY  04/07/2011   SLF: no evidence of h pylori/gastric body and antral mucosa with minimal chronic inflammation, negative H.pylori   HEMORRHOID SURGERY  07/21/2011   Procedure: HEMORRHOIDECTOMY;  Surgeon: Dalia Heading, MD;  Location: [facility];  Service: General;  Laterality: N/A;   PARTIAL HYSTERECTOMY  2008   with removal of rt ovary   REDUCTION MAMMAPLASTY Bilateral 03/20/2022   TUBAL LIGATION  1992    Social History    Socioeconomic History   Marital status: Legally Separated    Spouse name: [name]   Number of children: 2   Years of education: college   Highest education level: Not on file  Occupational History    Employer: NOT EMPLOYED  Tobacco Use   Smoking status: Never   Smokeless tobacco: Never  Substance and Sexual Activity   Alcohol use: No   Drug use: No   Sexual activity: Yes  Other Topics Concern   Not on file  Social History Narrative   [name]   Patient has two children.   Patient has a college education.         Social Determinants of Health   Financial Resource Strain: Not on file  Food Insecurity: Not on file  Transportation Needs: Not on file  Physical Activity: Not on file  Stress: Not on file  Social Connections: Unknown (10/06/2021)   Received from [facility], [facility]   Social Network    Social Network: Not on file  Intimate Partner Violence: Unknown (08/28/2021)   Received from [facility], [facility]   HITS    Physically Hurt: Not on file    Insult or Talk Down To: Not on file    Threaten Physical  Harm: Not on file    Scream or Curse: Not on file    Family History  Problem Relation Age of Onset   Heart disease Father    Diabetes Father    Cancer Father        prostate   Multiple sclerosis Sister    Diabetes Sister    Hyperlipidemia Sister    Cancer Sister    Cancer Mother        lung   Hypertension Mother    Hyperlipidemia Brother    Other Brother        rare blood disorder   Breast cancer Paternal Aunt    Colon cancer Neg Hx    Anesthesia problems Neg Hx    Hypotension Neg Hx    Malignant hyperthermia Neg Hx    Pseudochol deficiency Neg Hx     Allergies  Allergen Reactions   Contrast Media [Iodinated Contrast Media] Swelling    Facial swelling   Gadolinium Derivatives Itching and Rash    Patient had received contrast before without any adverse effects. About 15 minutes after  injection patient began to itch and showed signs of facial rash/swelling for several minutes. Radiologist examined and cleared patient, but suggested premedication for any future contrast injections.    Latex Rash    Review of Systems  Constitutional: Negative.  Negative for chills, fever, malaise/fatigue and weight loss.  HENT:  Negative for sore throat.   Eyes: Negative.   Respiratory: Negative.  Negative for cough and shortness of breath.   Cardiovascular: Negative.  Negative for chest pain, palpitations and leg swelling.  Gastrointestinal: Negative.  Negative for abdominal pain, constipation, diarrhea, heartburn, nausea and vomiting.  Genitourinary: Negative.  Negative for dysuria and flank pain.  Musculoskeletal: Negative.  Negative for joint pain and myalgias.  Skin: Negative.   Neurological: Negative.  Negative for dizziness, tingling, tremors and headaches.  Endo/Heme/Allergies: Negative.   Psychiatric/Behavioral: Negative.  Negative for depression and suicidal ideas. The patient is not nervous/anxious.        Objective:   BP 130/80   Pulse 82   Ht 5\' 3"  (1.6 m)   Wt 191 lb (86.6 kg)   SpO2 97%   BMI 33.83 kg/m   Vitals:   03/16/23 1034 03/16/23 1050  BP: 130/80   Pulse: 82   Height: 5\' 3"  (1.6 m)   Weight: 192 lb 3.2 oz (87.2 kg) 191 lb (86.6 kg)  SpO2: 97%   BMI (Calculated): 34.06 33.84    Physical Exam Vitals and nursing note reviewed.  Constitutional:      Appearance: Normal appearance.  HENT:     Head: Normocephalic and atraumatic.     Nose: Nose normal.     Mouth/Throat:     Mouth: Mucous membranes are moist.     Pharynx: Oropharynx is clear.  Eyes:     Conjunctiva/sclera: Conjunctivae normal.     Pupils: Pupils are equal, round, and reactive to light.  Cardiovascular:     Rate and Rhythm: Normal rate and regular rhythm.     Pulses: Normal pulses.     Heart sounds: Normal heart sounds. No murmur heard. Pulmonary:     Effort: Pulmonary effort  is normal.     Breath sounds: Normal breath sounds. No wheezing.  Abdominal:     General: Bowel sounds are normal.     Palpations: Abdomen is soft.     Tenderness: There is no abdominal tenderness. There is no right CVA tenderness or left  CVA tenderness.  Musculoskeletal:        General: Normal range of motion.     Cervical back: Normal range of motion.     Right lower leg: No edema.     Left lower leg: No edema.  Skin:    General: Skin is warm and dry.  Neurological:     General: No focal deficit present.     Mental Status: She is alert and oriented to person, place, and time.  Psychiatric:        Mood and Affect: Mood normal.        Behavior: Behavior normal.      Results for orders placed or performed in visit on 03/16/23  POCT Urinalysis Dipstick (81002)  Result Value Ref Range   Color, UA     Clarity, UA     Glucose, UA Negative Negative   Bilirubin, UA negative    Ketones, UA negative    Spec Grav, UA 1.020 1.010 - 1.025   Blood, UA Trace    pH, UA 7.0 5.0 - 8.0   Protein, UA Negative Negative   Urobilinogen, UA 0.2 0.2 or 1.0 E.U./dL   Nitrite, UA negative    Leukocytes, UA Negative Negative   Appearance     Odor    POCT Urinalysis Dipstick (81002)  Result Value Ref Range   Color, UA     Clarity, UA     Glucose, UA Negative Negative   Bilirubin, UA negative    Ketones, UA negative    Spec Grav, UA 1.020 1.010 - 1.025   Blood, UA trace    pH, UA 7.0 5.0 - 8.0   Protein, UA Negative Negative   Urobilinogen, UA 0.2 0.2 or 1.0 E.U./dL   Nitrite, UA negative    Leukocytes, UA Negative Negative   Appearance     Odor      Recent Results (from the past 2160 hour(s))  CBC with Diff     Status: Abnormal   Collection Time: 02/16/23 10:28 AM  Result Value Ref Range   WBC 4.5 3.4 - 10.8 x10E3/uL   RBC 5.41 (H) 3.77 - 5.28 x10E6/uL   Hemoglobin 13.9 11.1 - 15.9 g/dL   Hematocrit 21.3 08.6 - 46.6 %   MCV 81 79 - 97 fL   MCH 25.7 (L) 26.6 - 33.0 pg   MCHC  31.7 31.5 - 35.7 g/dL   RDW 57.8 46.9 - 62.9 %   Platelets 283 150 - 450 x10E3/uL   Neutrophils 41 Not Estab. %   Lymphs 48 Not Estab. %   Monocytes 9 Not Estab. %   Eos 2 Not Estab. %   Basos 0 Not Estab. %   Neutrophils Absolute 1.8 1.4 - 7.0 x10E3/uL   Lymphocytes Absolute 2.1 0.7 - 3.1 x10E3/uL   Monocytes Absolute 0.4 0.1 - 0.9 x10E3/uL   EOS (ABSOLUTE) 0.1 0.0 - 0.4 x10E3/uL   Basophils Absolute 0.0 0.0 - 0.2 x10E3/uL   Immature Granulocytes 0 Not Estab. %   Immature Grans (Abs) 0.0 0.0 - 0.1 x10E3/uL  CMP14+EGFR     Status: None   Collection Time: 02/16/23 10:28 AM  Result Value Ref Range   Glucose 92 70 - 99 mg/dL   BUN 13 6 - 24 mg/dL   Creatinine, Ser 5.28 0.57 - 1.00 mg/dL   eGFR 77 >41 LK/GMW/1.02   BUN/Creatinine Ratio 15 9 - 23   Sodium 142 134 - 144 mmol/L   Potassium 4.5 3.5 - 5.2  mmol/L   Chloride 104 96 - 106 mmol/L   CO2 25 20 - 29 mmol/L   Calcium 9.3 8.7 - 10.2 mg/dL   Total Protein 7.2 6.0 - 8.5 g/dL   Albumin 4.2 3.8 - 4.9 g/dL   Globulin, Total 3.0 1.5 - 4.5 g/dL   Bilirubin Total 0.3 0.0 - 1.2 mg/dL   Alkaline Phosphatase 101 44 - 121 IU/L   AST 22 0 - 40 IU/L   ALT 20 0 - 32 IU/L  Lipid Panel w/o Chol/HDL Ratio     Status: Abnormal   Collection Time: 02/16/23 10:28 AM  Result Value Ref Range   Cholesterol, Total 268 (H) 100 - 199 mg/dL   Triglycerides 96 0 - 149 mg/dL   HDL 71 >74 mg/dL   VLDL Cholesterol Cal 16 5 - 40 mg/dL   LDL Chol Calc (NIH) 259 (H) 0 - 99 mg/dL  Hemoglobin D6L     Status: Abnormal   Collection Time: 02/16/23 10:28 AM  Result Value Ref Range   Hgb A1c MFr Bld 5.9 (H) 4.8 - 5.6 %    Comment:          Prediabetes: 5.7 - 6.4          Diabetes: >6.4          Glycemic control for adults with diabetes: <7.0    Est. average glucose Bld gHb Est-mCnc 123 mg/dL  POCT Urinalysis Dipstick (87564)     Status: None   Collection Time: 03/16/23 11:05 AM  Result Value Ref Range   Color, UA     Clarity, UA     Glucose, UA Negative  Negative   Bilirubin, UA negative    Ketones, UA negative    Spec Grav, UA 1.020 1.010 - 1.025   Blood, UA Trace    pH, UA 7.0 5.0 - 8.0   Protein, UA Negative Negative   Urobilinogen, UA 0.2 0.2 or 1.0 E.U./dL   Nitrite, UA negative    Leukocytes, UA Negative Negative   Appearance     Odor    POCT Urinalysis Dipstick ([id number])     Status: None   Collection Time: 03/16/23 12:00 PM  Result Value Ref Range   Color, UA     Clarity, UA     Glucose, UA Negative Negative   Bilirubin, UA negative    Ketones, UA negative    Spec Grav, UA 1.020 1.010 - 1.025   Blood, UA trace    pH, UA 7.0 5.0 - 8.0   Protein, UA Negative Negative   Urobilinogen, UA 0.2 0.2 or 1.0 E.U./dL   Nitrite, UA negative    Leukocytes, UA Negative Negative   Appearance     Odor        Assessment & Plan:  Patient will continue all her medications including Ozempic.  Encouraged to drink more water.  Start back on Linzess. Problem List Items Addressed This Visit     Irritable bowel syndrome (IBS)   Relevant Medications   linaclotide (LINZESS) 145 MCG CAPS capsule   Essential hypertension, benign - Primary   Mixed hyperlipidemia   Prediabetes   Other Visit Diagnoses     Burning with urination       Relevant Orders   POCT Urinalysis Dipstick (18841) (Completed)   POCT Urinalysis Dipstick ([id number]) (Completed)       Return in about 1 month (around 04/16/2023).   Total time spent: 30 minutes  Margaretann Loveless, MD  03/16/2023  This document may have been prepared by [employer] Voice Recognition software and as such may include unintentional dictation errors.

## 2023-04-16 DIAGNOSIS — G4733 Obstructive sleep apnea (adult) (pediatric): Secondary | ICD-10-CM | POA: Diagnosis not present

## 2023-04-20 ENCOUNTER — Ambulatory Visit: Payer: 59 | Admitting: Internal Medicine

## 2023-04-27 ENCOUNTER — Ambulatory Visit: Payer: 59 | Admitting: Internal Medicine

## 2023-05-15 ENCOUNTER — Other Ambulatory Visit: Payer: Self-pay | Admitting: Internal Medicine

## 2023-05-15 ENCOUNTER — Other Ambulatory Visit: Payer: Self-pay

## 2023-05-15 DIAGNOSIS — I10 Essential (primary) hypertension: Secondary | ICD-10-CM

## 2023-05-15 DIAGNOSIS — E559 Vitamin D deficiency, unspecified: Secondary | ICD-10-CM

## 2023-05-15 DIAGNOSIS — E782 Mixed hyperlipidemia: Secondary | ICD-10-CM

## 2023-05-15 MED ORDER — LIVALO 2 MG PO TABS: 1.0000 | ORAL_TABLET | Freq: Every day | ORAL | 3 refills | Status: DC

## 2023-05-18 ENCOUNTER — Ambulatory Visit: Payer: 59 | Admitting: Internal Medicine

## 2023-05-25 ENCOUNTER — Encounter: Payer: Self-pay | Admitting: Internal Medicine

## 2023-05-25 ENCOUNTER — Ambulatory Visit (INDEPENDENT_AMBULATORY_CARE_PROVIDER_SITE_OTHER): Payer: 59 | Admitting: Internal Medicine

## 2023-05-25 VITALS — BP 140/92 | HR 110 | Ht 63.0 in | Wt 189.8 lb

## 2023-05-25 DIAGNOSIS — R7303 Prediabetes: Secondary | ICD-10-CM

## 2023-05-25 DIAGNOSIS — E782 Mixed hyperlipidemia: Secondary | ICD-10-CM | POA: Diagnosis not present

## 2023-05-25 DIAGNOSIS — K581 Irritable bowel syndrome with constipation: Secondary | ICD-10-CM

## 2023-05-25 DIAGNOSIS — E66811 Obesity, class 1: Secondary | ICD-10-CM

## 2023-05-25 DIAGNOSIS — I10 Essential (primary) hypertension: Secondary | ICD-10-CM

## 2023-05-25 DIAGNOSIS — Z0000 Encounter for general adult medical examination without abnormal findings: Secondary | ICD-10-CM

## 2023-05-25 DIAGNOSIS — Z1231 Encounter for screening mammogram for malignant neoplasm of breast: Secondary | ICD-10-CM | POA: Diagnosis not present

## 2023-05-25 MED ORDER — MOUNJARO 5 MG/0.5ML ~~LOC~~ SOAJ: 5.0000 mg | SUBCUTANEOUS | 0 refills | Status: DC

## 2023-05-25 NOTE — Progress Notes (Signed)
Established Patient Office Visit  Subjective:  Patient ID: Jill English, female    DOB: Feb 03, 1966  Age: 57 y.o. MRN: [id number]  Chief Complaint  Patient presents with   Annual Exam    AWV    Patient is here for her annual wellness visit.  She is generally feeling well and has no new complaints.  She reports of constipation and had to stop Ozempic 1 mg/week due to her constipation getting worse.  She does have history of IBS with constipation and Linzess was not able to help her symptoms.  She is willing to try [facility], will send in a new prescription.  She has a history of very high LDL and is currently on Livalo, will need fasting blood work. Needs to be scheduled for mammogram. Last colonoscopy was in 2022.   Her PHQ-9/GAD score is 11/5.  Patient reports that she was under stress due to her son's condition which is actually getting much better now.  Does not want to start any treatment at this time.    No other concerns at this time.   Past Medical History:  Diagnosis Date   Anxiety    Anxiety    Depression    Diverticulitis Sept 2011   CT documented   GERD (gastroesophageal reflux disease)    Headache    Heart murmur    seen by cardiologist, normal work up   Hemorrhoids    History of hiatal hernia    Hyperlipidemia    Hypertension    moderate obesity    MS (multiple sclerosis) (HCC)    Narcolepsy    Sleep apnea    uses cpap    Past Surgical History:  Procedure Laterality Date   ABDOMINAL HYSTERECTOMY     BACK SURGERY  2004   BREAST BIOPSY Left 05/29/2022   [place] Core BX Coil Clip - path pending   BREAST BIOPSY Left 05/29/2022   [place] LT BREAST BX W LOC DEV 1ST LESION IMG BX SPEC US GUIDE 05/29/2022 [facility]   carpal tunnel repair  2009   COLONOSCOPY  04/07/2011   WUJ:WJXBJ internal hemorrhoids/diverticula in the sigmoid colon   ESOPHAGOGASTRODUODENOSCOPY  04/07/2011   SLF: no evidence of h pylori/gastric body and antral mucosa with minimal chronic  inflammation, negative H.pylori   HEMORRHOID SURGERY  07/21/2011   Procedure: HEMORRHOIDECTOMY;  Surgeon: Dalia Heading, MD;  Location: [facility];  Service: General;  Laterality: N/A;   PARTIAL HYSTERECTOMY  2008   with removal of rt ovary   REDUCTION MAMMAPLASTY Bilateral 03/20/2022   TUBAL LIGATION  1992    Social History   Socioeconomic History   Marital status: Legally Separated    Spouse name: [name]   Number of children: 2   Years of education: college   Highest education level: Not on file  Occupational History    Employer: NOT EMPLOYED  Tobacco Use   Smoking status: Never   Smokeless tobacco: Never  Substance and Sexual Activity   Alcohol use: No   Drug use: No   Sexual activity: Yes  Other Topics Concern   Not on file  Social History Narrative   [name]   Patient has two children.   Patient has a college education.         Social Drivers of Corporate investment banker Strain: Not on file  Food Insecurity: No Food Insecurity (05/25/2023)   Hunger Vital Sign    Worried About  Running Out of Food in the Last Year: Never true    Ran Out of Food in the Last Year: Never true  Transportation Needs: No Transportation Needs (05/25/2023)   PRAPARE - Administrator, Civil Service (Medical): No    Lack of Transportation (Non-Medical): No  Physical Activity: Not on file  Stress: No Stress Concern Present (05/25/2023)   [name]    Feeling of Stress : Only a little  Social Connections: Unknown (10/06/2021)   Received from [facility], [facility]   Social Network    Social Network: Not on file  Intimate Partner Violence: Not At Risk (05/25/2023)   Humiliation, Afraid, Rape, and Kick questionnaire    Fear of Current or Ex-Partner: No    Emotionally Abused: No    Physically Abused: No    Sexually Abused: No    Family History  Problem  Relation Age of Onset   Heart disease Father    Diabetes Father    Cancer Father        prostate   Multiple sclerosis Sister    Diabetes Sister    Hyperlipidemia Sister    Cancer Sister    Cancer Mother        lung   Hypertension Mother    Hyperlipidemia Brother    Other Brother        rare blood disorder   Breast cancer Paternal Aunt    Colon cancer Neg Hx    Anesthesia problems Neg Hx    Hypotension Neg Hx    Malignant hyperthermia Neg Hx    Pseudochol deficiency Neg Hx     Allergies  Allergen Reactions   Contrast Media [Iodinated Contrast Media] Swelling    Facial swelling   Gadolinium Derivatives Itching and Rash    Patient had received contrast before without any adverse effects. About 15 minutes after injection patient began to itch and showed signs of facial rash/swelling for several minutes. Radiologist examined and cleared patient, but suggested premedication for any future contrast injections.    Latex Rash    Outpatient Medications Prior to Visit  Medication Sig   acetaminophen (TYLENOL) 500 MG tablet Take 1 tablet (500 mg total) by mouth every 6 (six) hours as needed. For use AFTER surgery   cyclobenzaprine (FLEXERIL) 10 MG tablet Take 1 tablet (10 mg total) by mouth 3 (three) times daily as needed for muscle spasms.   desonide (DESOWEN) 0.05 % lotion Apply 1 application topically Twice daily as needed. Apply to scalp if a flare-up of alopecia   ELDERBERRY PO Take by mouth.   ezetimibe (ZETIA) 10 MG tablet Take 10 mg by mouth daily.   fexofenadine (ALLEGRA) 180 MG tablet Take 180 mg by mouth daily.   fluticasone (FLONASE) 50 MCG/ACT nasal spray Place 2 sprays into the nose daily as needed. Sinuses   ibuprofen (ADVIL) 600 MG tablet Take 1 tablet (600 mg total) by mouth every 6 (six) hours as needed for mild pain or moderate pain. For use AFTER surgery   linaclotide (LINZESS) 145 MCG CAPS capsule Take 1 capsule (145 mcg total) by mouth daily before breakfast.    LIVALO 2 MG TABS Take 1 tablet (2 mg total) by mouth daily.   Multiple Vitamin (MULTIVITAMIN) capsule Take 1 capsule by mouth daily.   ondansetron (ZOFRAN) 4 MG tablet Take 1 tablet (4 mg total) by mouth every 8 (eight) hours as needed for nausea or vomiting.  pantoprazole (PROTONIX) 40 MG tablet Take 1 tablet (40 mg total) by mouth daily. 30 minutes before first meal of day.   potassium chloride (K-DUR) 10 MEQ tablet Take 1 tablet (10 mEq total) by mouth 2 (two) times daily.   sucralfate (CARAFATE) 1 g tablet Take 1 tablet (1 g total) by mouth 2 (two) times daily.   SUMAtriptan (IMITREX) 100 MG tablet Take 100 mg by mouth once. May repeat in 2 hours if headache persists or recurs.   valACYclovir (VALTREX) 500 MG tablet TAKE 1 TABLET BY MOUTH TWICE DAILY FOR 3 DAYS FOR OUTBREAK   valsartan-hydrochlorothiazide (DIOVAN-HCT) 160-25 MG tablet TAKE 1 TABLET BY MOUTH EVERY DAY   Vitamin D, Ergocalciferol, (DRISDOL) 1.25 MG (50000 UNIT) CAPS capsule TAKE 1 CAPSULE BY MOUTH 1 TIME A WEEK   [DISCONTINUED] Semaglutide, 1 MG/DOSE, (OZEMPIC, 1 MG/DOSE,) 4 MG/3ML SOPN Inject 1 mg into the skin once a week.   No facility-administered medications prior to visit.    Review of Systems  Constitutional: Negative.  Negative for chills, fever, malaise/fatigue and weight loss.  HENT: Negative.  Negative for congestion and hearing loss.   Eyes: Negative.   Respiratory: Negative.  Negative for cough, shortness of breath and stridor.   Cardiovascular: Negative.  Negative for chest pain, palpitations and leg swelling.  Gastrointestinal: Negative.  Negative for abdominal pain, constipation, diarrhea, heartburn, nausea and vomiting.  Genitourinary: Negative.  Negative for dysuria and flank pain.  Musculoskeletal: Negative.  Negative for joint pain and myalgias.  Skin: Negative.   Neurological:  Negative for dizziness, tingling, tremors and headaches.  Endo/Heme/Allergies: Negative.   Psychiatric/Behavioral:  Negative.  Negative for depression and suicidal ideas. The patient is not nervous/anxious.        Objective:   BP (!) 140/92   Pulse (!) 110   Ht 5\' 3"  (1.6 m)   Wt 189 lb 12.8 oz (86.1 kg)   SpO2 97%   BMI 33.62 kg/m   Vitals:   05/25/23 1420  BP: (!) 140/92  Pulse: (!) 110  Height: 5\' 3"  (1.6 m)  Weight: 189 lb 12.8 oz (86.1 kg)  SpO2: 97%  BMI (Calculated): 33.63    Physical Exam Vitals and nursing note reviewed.  Constitutional:      Appearance: Normal appearance.  HENT:     Head: Normocephalic and atraumatic.     Nose: Nose normal.     Mouth/Throat:     Mouth: Mucous membranes are moist.     Pharynx: Oropharynx is clear.  Eyes:     Conjunctiva/sclera: Conjunctivae normal.     Pupils: Pupils are equal, round, and reactive to light.  Cardiovascular:     Rate and Rhythm: Normal rate and regular rhythm.     Pulses: Normal pulses.     Heart sounds: Normal heart sounds. No murmur heard. Pulmonary:     Effort: Pulmonary effort is normal.     Breath sounds: Normal breath sounds. No wheezing.  Abdominal:     General: Bowel sounds are normal.     Palpations: Abdomen is soft.     Tenderness: There is no abdominal tenderness. There is no right CVA tenderness or left CVA tenderness.  Musculoskeletal:        General: Normal range of motion.     Cervical back: Normal range of motion.     Right lower leg: No edema.     Left lower leg: No edema.  Skin:    General: Skin is warm and dry.  Neurological:  General: No focal deficit present.     Mental Status: She is alert and oriented to person, place, and time.  Psychiatric:        Mood and Affect: Mood normal.        Behavior: Behavior normal.      No results found for any visits on 05/25/23.  Recent Results (from the past 2160 hours)  POCT Urinalysis Dipstick ([id number])     Status: None   Collection Time: 03/16/23 11:05 AM  Result Value Ref Range   Color, UA     Clarity, UA     Glucose, UA Negative Negative    Bilirubin, UA negative    Ketones, UA negative    Spec Grav, UA 1.020 1.010 - 1.025   Blood, UA Trace    pH, UA 7.0 5.0 - 8.0   Protein, UA Negative Negative   Urobilinogen, UA 0.2 0.2 or 1.0 E.U./dL   Nitrite, UA negative    Leukocytes, UA Negative Negative   Appearance     Odor    POCT Urinalysis Dipstick ([id number])     Status: None   Collection Time: 03/16/23 12:00 PM  Result Value Ref Range   Color, UA     Clarity, UA     Glucose, UA Negative Negative   Bilirubin, UA negative    Ketones, UA negative    Spec Grav, UA 1.020 1.010 - 1.025   Blood, UA trace    pH, UA 7.0 5.0 - 8.0   Protein, UA Negative Negative   Urobilinogen, UA 0.2 0.2 or 1.0 E.U./dL   Nitrite, UA negative    Leukocytes, UA Negative Negative   Appearance     Odor        Assessment & Plan:  Start Mounjaro.  Strict diet control. Fasting labs next week.  Continue other medications. Problem List Items Addressed This Visit     Irritable bowel syndrome (IBS)   Obesity (BMI 30.0-34.9)   Relevant Medications   tirzepatide (MOUNJARO) 5 MG/0.5ML Pen   Essential hypertension, benign   Relevant Orders   CMP14+EGFR   Mixed hyperlipidemia   Relevant Orders   Lipid Panel w/o Chol/HDL Ratio   Prediabetes   Relevant Orders   Hemoglobin A1c   Other Visit Diagnoses       Medicare annual wellness visit, subsequent    -  Primary     Breast cancer screening by mammogram       Relevant Orders   MM 3D SCREENING MAMMOGRAM BILATERAL BREAST       Return in about 1 month (around 06/25/2023).   Total time spent: 30 minutes  Margaretann Loveless, MD  05/25/2023   This document may have been prepared by [facility] Voice Recognition software and as such may include unintentional dictation errors.

## 2023-06-01 ENCOUNTER — Telehealth: Payer: Self-pay | Admitting: Internal Medicine

## 2023-06-01 NOTE — Telephone Encounter (Signed)
 Patient informed.

## 2023-06-01 NOTE — Telephone Encounter (Signed)
 Patient left VM regarding her [place] PA. It was denied on 05/28/23.

## 2023-06-16 ENCOUNTER — Other Ambulatory Visit: Payer: Self-pay | Admitting: Internal Medicine

## 2023-06-29 ENCOUNTER — Encounter: Payer: Self-pay | Admitting: Internal Medicine

## 2023-06-29 ENCOUNTER — Ambulatory Visit (INDEPENDENT_AMBULATORY_CARE_PROVIDER_SITE_OTHER): Payer: 59 | Admitting: Internal Medicine

## 2023-06-29 VITALS — BP 130/86 | HR 72 | Ht 63.0 in | Wt 188.2 lb

## 2023-06-29 DIAGNOSIS — D508 Other iron deficiency anemias: Secondary | ICD-10-CM

## 2023-06-29 DIAGNOSIS — E782 Mixed hyperlipidemia: Secondary | ICD-10-CM

## 2023-06-29 DIAGNOSIS — R052 Subacute cough: Secondary | ICD-10-CM

## 2023-06-29 DIAGNOSIS — R7303 Prediabetes: Secondary | ICD-10-CM | POA: Diagnosis not present

## 2023-06-29 DIAGNOSIS — E66811 Obesity, class 1: Secondary | ICD-10-CM

## 2023-06-29 DIAGNOSIS — I10 Essential (primary) hypertension: Secondary | ICD-10-CM | POA: Diagnosis not present

## 2023-06-29 MED ORDER — AZITHROMYCIN 250 MG PO TABS: ORAL_TABLET | ORAL | 0 refills | Status: AC

## 2023-06-29 NOTE — Progress Notes (Signed)
Established Patient Office Visit  Subjective:  Patient ID: Jill English, female    DOB: 1965/07/13  Age: 58 y.o. MRN: [id number]  Chief Complaint  Patient presents with   Follow-up    1 month follow up    Patient comes in for her follow-up today.  Her blood pressure is looking better.  She was not able to get [facility] however is continuing to take Ozempic injections.  She is fasting for blood work.  Her last LDL was very high needs to be monitored.  Denies nausea or vomiting, no diarrhea or constipation.  However reports of an upper respiratory tract infection about 2 weeks ago, but now continues to have mild chest congestion and a productive cough with greenish sputum.  Will send in a Z-Pak.    No other concerns at this time.   Past Medical History:  Diagnosis Date   Anxiety    Anxiety    Depression    Diverticulitis Sept 2011   CT documented   GERD (gastroesophageal reflux disease)    Headache    Heart murmur    seen by cardiologist, normal work up   Hemorrhoids    History of hiatal hernia    Hyperlipidemia    Hypertension    moderate obesity    MS (multiple sclerosis) (HCC)    Narcolepsy    Sleep apnea    uses cpap    Past Surgical History:  Procedure Laterality Date   ABDOMINAL HYSTERECTOMY     BACK SURGERY  2004   BREAST BIOPSY Left 05/29/2022   [place] Core BX Coil Clip - path pending   BREAST BIOPSY Left 05/29/2022   [place] LT BREAST BX W LOC DEV 1ST LESION IMG BX SPEC US GUIDE 05/29/2022 [facility]   carpal tunnel repair  2009   COLONOSCOPY  04/07/2011   WUJ:WJXBJ internal hemorrhoids/diverticula in the sigmoid colon   ESOPHAGOGASTRODUODENOSCOPY  04/07/2011   SLF: no evidence of h pylori/gastric body and antral mucosa with minimal chronic inflammation, negative H.pylori   HEMORRHOID SURGERY  07/21/2011   Procedure: HEMORRHOIDECTOMY;  Surgeon: Dalia Heading, MD;  Location: [facility];  Service: General;  Laterality: N/A;   PARTIAL HYSTERECTOMY  2008    with removal of rt ovary   REDUCTION MAMMAPLASTY Bilateral 03/20/2022   TUBAL LIGATION  1992    Social History   Socioeconomic History   Marital status: Legally Separated    Spouse name: [name]   Number of children: 2   Years of education: college   Highest education level: Not on file  Occupational History    Employer: NOT EMPLOYED  Tobacco Use   Smoking status: Never   Smokeless tobacco: Never  Substance and Sexual Activity   Alcohol use: No   Drug use: No   Sexual activity: Yes  Other Topics Concern   Not on file  Social History Narrative   [name]   Patient has two children.   Patient has a college education.         Social Drivers of Corporate investment banker Strain: Not on file  Food Insecurity: No Food Insecurity (05/25/2023)   Hunger Vital Sign    Worried About Running Out of Food in the Last Year: Never true    Ran Out of Food in the Last Year: Never true  Transportation Needs: No Transportation Needs (05/25/2023)   PRAPARE - Administrator, Civil Service (Medical): No  Lack of Transportation (Non-Medical): No  Physical Activity: Not on file  Stress: No Stress Concern Present (05/25/2023)   [name]    Feeling of Stress : Only a little  Social Connections: Unknown (10/06/2021)   Received from [facility]   Social Network    Social Network: Not on file  Intimate Partner Violence: Not At Risk (05/25/2023)   Humiliation, Afraid, Rape, and Kick questionnaire    Fear of Current or Ex-Partner: No    Emotionally Abused: No    Physically Abused: No    Sexually Abused: No    Family History  Problem Relation Age of Onset   Heart disease Father    Diabetes Father    Cancer Father        prostate   Multiple sclerosis Sister    Diabetes Sister    Hyperlipidemia Sister    Cancer Sister    Cancer Mother         lung   Hypertension Mother    Hyperlipidemia Brother    Other Brother        rare blood disorder   Breast cancer Paternal Aunt    Colon cancer Neg Hx    Anesthesia problems Neg Hx    Hypotension Neg Hx    Malignant hyperthermia Neg Hx    Pseudochol deficiency Neg Hx     Allergies  Allergen Reactions   Contrast Media [Iodinated Contrast Media] Swelling    Facial swelling   Gadolinium Derivatives Itching and Rash    Patient had received contrast before without any adverse effects. About 15 minutes after injection patient began to itch and showed signs of facial rash/swelling for several minutes. Radiologist examined and cleared patient, but suggested premedication for any future contrast injections.    Latex Rash    Outpatient Medications Prior to Visit  Medication Sig   acetaminophen (TYLENOL) 500 MG tablet Take 1 tablet (500 mg total) by mouth every 6 (six) hours as needed. For use AFTER surgery   cyclobenzaprine (FLEXERIL) 10 MG tablet Take 1 tablet (10 mg total) by mouth 3 (three) times daily as needed for muscle spasms.   desonide (DESOWEN) 0.05 % lotion Apply 1 application topically Twice daily as needed. Apply to scalp if a flare-up of alopecia   ELDERBERRY PO Take by mouth.   ezetimibe (ZETIA) 10 MG tablet Take 10 mg by mouth daily.   fexofenadine (ALLEGRA) 180 MG tablet Take 180 mg by mouth daily.   fluticasone (FLONASE) 50 MCG/ACT nasal spray Place 2 sprays into the nose daily as needed. Sinuses   ibuprofen (ADVIL) 600 MG tablet Take 1 tablet (600 mg total) by mouth every 6 (six) hours as needed for mild pain or moderate pain. For use AFTER surgery   linaclotide (LINZESS) 145 MCG CAPS capsule Take 1 capsule (145 mcg total) by mouth daily before breakfast.   LIVALO 2 MG TABS Take 1 tablet (2 mg total) by mouth daily.   Multiple Vitamin (MULTIVITAMIN) capsule Take 1 capsule by mouth daily.   ondansetron (ZOFRAN) 4 MG tablet Take 1 tablet (4 mg total) by mouth every 8 (eight)  hours as needed for nausea or vomiting.   pantoprazole (PROTONIX) 40 MG tablet Take 1 tablet (40 mg total) by mouth daily. 30 minutes before first meal of day.   potassium chloride (K-DUR) 10 MEQ tablet Take 1 tablet (10 mEq total) by mouth 2 (two) times daily.   sucralfate (CARAFATE) 1  g tablet Take 1 tablet (1 g total) by mouth 2 (two) times daily.   SUMAtriptan (IMITREX) 100 MG tablet Take 100 mg by mouth once. May repeat in 2 hours if headache persists or recurs.   tirzepatide [facility]) 5 MG/0.5ML Pen Inject 5 mg into the skin once a week.   valACYclovir (VALTREX) 500 MG tablet TAKE 1 TABLET BY MOUTH TWICE DAILY FOR 3 DAYS FOR OUTBREAK   valsartan-hydrochlorothiazide (DIOVAN-HCT) 160-25 MG tablet TAKE 1 TABLET BY MOUTH EVERY DAY   Vitamin D, Ergocalciferol, (DRISDOL) 1.25 MG (50000 UNIT) CAPS capsule TAKE 1 CAPSULE BY MOUTH 1 TIME A WEEK   No facility-administered medications prior to visit.    Review of Systems  Constitutional: Negative.  Negative for chills, fever, malaise/fatigue and weight loss.  HENT: Negative.  Negative for congestion, hearing loss and sore throat.   Eyes: Negative.   Respiratory: Negative.  Negative for cough, shortness of breath and stridor.   Cardiovascular: Negative.  Negative for chest pain, palpitations and leg swelling.  Gastrointestinal: Negative.  Negative for abdominal pain, constipation, diarrhea, heartburn, nausea and vomiting.  Genitourinary: Negative.  Negative for dysuria and flank pain.  Musculoskeletal: Negative.  Negative for joint pain and myalgias.  Skin: Negative.   Neurological: Negative.  Negative for dizziness, tingling, tremors, sensory change and headaches.  Endo/Heme/Allergies: Negative.   Psychiatric/Behavioral: Negative.  Negative for depression and suicidal ideas. The patient is not nervous/anxious.        Objective:   BP 130/86   Pulse 72   Ht 5\' 3"  (1.6 m)   Wt 188 lb 3.2 oz (85.4 kg)   SpO2 98%   BMI 33.34 kg/m    Vitals:   06/29/23 1030  BP: 130/86  Pulse: 72  Height: 5\' 3"  (1.6 m)  Weight: 188 lb 3.2 oz (85.4 kg)  SpO2: 98%  BMI (Calculated): 33.35    Physical Exam Vitals and nursing note reviewed.  Constitutional:      Appearance: Normal appearance.  HENT:     Head: Normocephalic and atraumatic.     Nose: Nose normal.     Mouth/Throat:     Mouth: Mucous membranes are moist.     Pharynx: Oropharynx is clear.  Eyes:     Conjunctiva/sclera: Conjunctivae normal.     Pupils: Pupils are equal, round, and reactive to light.  Cardiovascular:     Rate and Rhythm: Normal rate and regular rhythm.     Pulses: Normal pulses.     Heart sounds: Normal heart sounds. No murmur heard. Pulmonary:     Effort: Pulmonary effort is normal.     Breath sounds: Normal breath sounds. No wheezing.  Abdominal:     General: Bowel sounds are normal.     Palpations: Abdomen is soft.     Tenderness: There is no abdominal tenderness. There is no right CVA tenderness or left CVA tenderness.  Musculoskeletal:        General: Normal range of motion.     Cervical back: Normal range of motion.     Right lower leg: No edema.     Left lower leg: No edema.  Skin:    General: Skin is warm and dry.  Neurological:     General: No focal deficit present.     Mental Status: She is alert and oriented to person, place, and time.  Psychiatric:        Mood and Affect: Mood normal.        Behavior: Behavior normal.  No results found for any visits on 06/29/23.  No results found for this or any previous visit (from the past 2160 hours).    Assessment & Plan:  Fasting labs today.  Continue medications. Problem List Items Addressed This Visit     Obesity (BMI 30.0-34.9) - Primary   Essential hypertension, benign   Relevant Orders   CMP14+EGFR   Mixed hyperlipidemia   Relevant Orders   Lipid Panel w/o Chol/HDL Ratio   CMP14+EGFR   Iron deficiency anemia secondary to inadequate dietary iron intake    Relevant Orders   CBC with Diff   Prediabetes   Relevant Orders   Hemoglobin A1c   Other Visit Diagnoses       Subacute cough       Relevant Medications   azithromycin (ZITHROMAX) 250 MG tablet       Return in about 6 weeks (around 08/10/2023).   Total time spent: 30 minutes  Margaretann Loveless, MD  06/29/2023   This document may have been prepared by [facility] Voice Recognition software and as such may include unintentional dictation errors.

## 2023-06-30 ENCOUNTER — Other Ambulatory Visit: Payer: Self-pay | Admitting: Internal Medicine

## 2023-06-30 DIAGNOSIS — E782 Mixed hyperlipidemia: Secondary | ICD-10-CM

## 2023-06-30 LAB — CBC WITH DIFFERENTIAL/PLATELET
Basophils Absolute: 0 10*3/uL (ref 0.0–0.2)
Basos: 1 %
EOS (ABSOLUTE): 0.1 10*3/uL (ref 0.0–0.4)
Eos: 2 %
Hematocrit: 42.8 % (ref 34.0–46.6)
Hemoglobin: 13.5 g/dL (ref 11.1–15.9)
Immature Grans (Abs): 0 10*3/uL (ref 0.0–0.1)
Immature Granulocytes: 0 %
Lymphocytes Absolute: 2.5 10*3/uL (ref 0.7–3.1)
Lymphs: 48 %
MCH: 25.6 pg — ABNORMAL LOW (ref 26.6–33.0)
MCHC: 31.5 g/dL (ref 31.5–35.7)
MCV: 81 fL (ref 79–97)
Monocytes Absolute: 0.5 10*3/uL (ref 0.1–0.9)
Monocytes: 9 %
Neutrophils Absolute: 2.1 10*3/uL (ref 1.4–7.0)
Neutrophils: 40 %
Platelets: 320 10*3/uL (ref 150–450)
RBC: 5.28 x10E6/uL (ref 3.77–5.28)
RDW: 14.5 % (ref 11.7–15.4)
WBC: 5.3 10*3/uL (ref 3.4–10.8)

## 2023-06-30 LAB — CMP14+EGFR
ALT: 18 [IU]/L (ref 0–32)
AST: 20 [IU]/L (ref 0–40)
Albumin: 4.3 g/dL (ref 3.8–4.9)
Alkaline Phosphatase: 99 [IU]/L (ref 44–121)
BUN/Creatinine Ratio: 11 (ref 9–23)
BUN: 10 mg/dL (ref 6–24)
Bilirubin Total: 0.3 mg/dL (ref 0.0–1.2)
CO2: 25 mmol/L (ref 20–29)
Calcium: 9.4 mg/dL (ref 8.7–10.2)
Chloride: 103 mmol/L (ref 96–106)
Creatinine, Ser: 0.91 mg/dL (ref 0.57–1.00)
Globulin, Total: 2.9 g/dL (ref 1.5–4.5)
Glucose: 89 mg/dL (ref 70–99)
Potassium: 4.2 mmol/L (ref 3.5–5.2)
Sodium: 141 mmol/L (ref 134–144)
Total Protein: 7.2 g/dL (ref 6.0–8.5)
eGFR: 75 mL/min/{1.73_m2} (ref >59)

## 2023-06-30 LAB — HEMOGLOBIN A1C
Est. average glucose Bld gHb Est-mCnc: 123 mg/dL
Hgb A1c MFr Bld: 5.9 % — ABNORMAL HIGH (ref 4.8–5.6)

## 2023-06-30 LAB — LIPID PANEL W/O CHOL/HDL RATIO
Cholesterol, Total: 258 mg/dL — ABNORMAL HIGH (ref 100–199)
HDL: 67 mg/dL (ref >39)
LDL Chol Calc (NIH): 171 mg/dL — ABNORMAL HIGH (ref 0–99)
Triglycerides: 116 mg/dL (ref 0–149)
VLDL Cholesterol Cal: 20 mg/dL (ref 5–40)

## 2023-06-30 MED ORDER — NEXLIZET 180-10 MG PO TABS: 1.0000 | ORAL_TABLET | Freq: Every day | ORAL | 3 refills | Status: DC

## 2023-06-30 NOTE — Progress Notes (Signed)
 Patient notified

## 2023-07-20 ENCOUNTER — Ambulatory Visit
Admission: RE | Admit: 2023-07-20 | Discharge: 2023-07-20 | Disposition: A | Discharge status: Home / Self Care | Payer: 59 | Source: Ambulatory Visit | Attending: Internal Medicine | Admitting: Internal Medicine

## 2023-07-20 DIAGNOSIS — Z1231 Encounter for screening mammogram for malignant neoplasm of breast: Secondary | ICD-10-CM | POA: Diagnosis not present

## 2023-07-21 DIAGNOSIS — G4733 Obstructive sleep apnea (adult) (pediatric): Secondary | ICD-10-CM | POA: Diagnosis not present

## 2023-08-04 ENCOUNTER — Encounter: Payer: Self-pay | Admitting: Internal Medicine

## 2023-08-04 ENCOUNTER — Ambulatory Visit (INDEPENDENT_AMBULATORY_CARE_PROVIDER_SITE_OTHER): Admitting: Internal Medicine

## 2023-08-04 ENCOUNTER — Other Ambulatory Visit: Payer: Self-pay | Admitting: Internal Medicine

## 2023-08-04 VITALS — BP 130/90 | HR 96 | Ht 63.0 in | Wt 190.6 lb

## 2023-08-04 DIAGNOSIS — R1032 Left lower quadrant pain: Secondary | ICD-10-CM

## 2023-08-04 DIAGNOSIS — R102 Pelvic and perineal pain unspecified side: Secondary | ICD-10-CM

## 2023-08-04 DIAGNOSIS — R7303 Prediabetes: Secondary | ICD-10-CM

## 2023-08-04 DIAGNOSIS — E782 Mixed hyperlipidemia: Secondary | ICD-10-CM

## 2023-08-04 DIAGNOSIS — I10 Essential (primary) hypertension: Secondary | ICD-10-CM

## 2023-08-04 DIAGNOSIS — K581 Irritable bowel syndrome with constipation: Secondary | ICD-10-CM | POA: Diagnosis not present

## 2023-08-04 LAB — POCT URINALYSIS DIPSTICK
Bilirubin, UA: NEGATIVE
Blood, UA: NEGATIVE
Glucose, UA: NEGATIVE
Ketones, UA: NEGATIVE
Leukocytes, UA: NEGATIVE
Nitrite, UA: NEGATIVE
Protein, UA: NEGATIVE
Spec Grav, UA: 1.025 (ref 1.010–1.025)
Urobilinogen, UA: 0.2 U/dL
pH, UA: 6 (ref 5.0–8.0)

## 2023-08-04 MED ORDER — MELOXICAM 7.5 MG PO TABS: 7.5000 mg | ORAL_TABLET | Freq: Every day | ORAL | 2 refills | Status: AC

## 2023-08-04 NOTE — Progress Notes (Signed)
 Established Patient Office Visit  Subjective:  Patient ID: Jill English, female    DOB: 1965/07/10  Age: 58 y.o. MRN: [id number]  Chief Complaint  Patient presents with   Acute Visit    Pain in left side    Patient comes in with complaints of left-sided lower abdominal pain.  She denies dysuria or burning micturition.  She is usually constipated due to her IBS and also being on Ozempic.  She is supposed to take Linzess but has not been taking it regularly.  Patient has a history of partial hysterectomy with right oophorectomy, and is concerned about her left ovary causing pain and tenderness. Her urine dipstick in office is negative. Denies back pain, no nausea vomiting, and no fever or chills. Will schedule a pelvic ultrasound. Advised to start taking her Linzess regularly. Patient thinks she only needs meloxicam for pain control.    No other concerns at this time.   Past Medical History:  Diagnosis Date   Anxiety    Anxiety    Depression    Diverticulitis Sept 2011   CT documented   GERD (gastroesophageal reflux disease)    Headache    Heart murmur    seen by cardiologist, normal work up   Hemorrhoids    History of hiatal hernia    Hyperlipidemia    Hypertension    moderate obesity    MS (multiple sclerosis) (HCC)    Narcolepsy    Sleep apnea    uses cpap    Past Surgical History:  Procedure Laterality Date   ABDOMINAL HYSTERECTOMY     BACK SURGERY  2004   BREAST BIOPSY Left 05/29/2022   [place] Core BX Coil Clip -  APOCRINE METAPLASIA WITH PAPILLARY FEATURES.   BREAST BIOPSY Left 05/29/2022   [place] LT BREAST BX W LOC DEV 1ST LESION IMG BX SPEC US GUIDE 05/29/2022 [facility]   carpal tunnel repair  2009   COLONOSCOPY  04/07/2011   WUJ:WJXBJ internal hemorrhoids/diverticula in the sigmoid colon   ESOPHAGOGASTRODUODENOSCOPY  04/07/2011   SLF: no evidence of h pylori/gastric body and antral mucosa with minimal chronic inflammation, negative H.pylori    HEMORRHOID SURGERY  07/21/2011   Procedure: HEMORRHOIDECTOMY;  Surgeon: Dalia Heading, MD;  Location: [facility];  Service: General;  Laterality: N/A;   PARTIAL HYSTERECTOMY  2008   with removal of rt ovary   REDUCTION MAMMAPLASTY Bilateral 03/20/2022   TUBAL LIGATION  1992    Social History   Socioeconomic History   Marital status: Legally Separated    Spouse name: Jill English   Number of children: 2   Years of education: college   Highest education level: Not on file  Occupational History    Employer: NOT EMPLOYED  Tobacco Use   Smoking status: Never   Smokeless tobacco: Never  Substance and Sexual Activity   Alcohol use: No   Drug use: No   Sexual activity: Yes  Other Topics Concern   Not on file  Social History Narrative   Patient is married Jill English) and lives with her husband.   Patient has two children.   Patient has a college education.         Social Drivers of Corporate investment banker Strain: Not on file  Food Insecurity: No Food Insecurity (05/25/2023)   Hunger Vital Sign    Worried About Running Out of Food in the Last Year: Never true    Ran Out of Food in the Last Year: Never true  Transportation Needs: No Transportation Needs (05/25/2023)   PRAPARE - Administrator, Civil Service (Medical): No    Lack of Transportation (Non-Medical): No  Physical Activity: Not on file  Stress: No Stress Concern Present (05/25/2023)   [name]    Feeling of Stress : Only a little  Social Connections: Unknown (10/06/2021)   Received from [name]   Social Network    Social Network: Not on file  Intimate Partner Violence: Not At Risk (05/25/2023)   Humiliation, Afraid, Rape, and Kick questionnaire    Fear of Current or Ex-Partner: No    Emotionally Abused: No    Physically Abused: No    Sexually Abused: No    Family History  Problem Relation Age of Onset   Heart disease  Father    Diabetes Father    Cancer Father        prostate   Multiple sclerosis Sister    Diabetes Sister    Hyperlipidemia Sister    Cancer Sister    Cancer Mother        lung   Hypertension Mother    Hyperlipidemia Brother    Other Brother        rare blood disorder   Breast cancer Paternal Aunt    Colon cancer Neg Hx    Anesthesia problems Neg Hx    Hypotension Neg Hx    Malignant hyperthermia Neg Hx    Pseudochol deficiency Neg Hx     Allergies  Allergen Reactions   Contrast Media [Iodinated Contrast Media] Swelling    Facial swelling   Gadolinium Derivatives Itching and Rash    Patient had received contrast before without any adverse effects. About 15 minutes after injection patient began to itch and showed signs of facial rash/swelling for several minutes. Radiologist examined and cleared patient, but suggested premedication for any future contrast injections.    Latex Rash    Outpatient Medications Prior to Visit  Medication Sig   acetaminophen (TYLENOL) 500 MG tablet Take 1 tablet (500 mg total) by mouth every 6 (six) hours as needed. For use AFTER surgery   Bempedoic Acid-Ezetimibe (NEXLIZET) 180-10 MG TABS Take 1 tablet by mouth daily.   cyclobenzaprine (FLEXERIL) 10 MG tablet Take 1 tablet (10 mg total) by mouth 3 (three) times daily as needed for muscle spasms.   desonide (DESOWEN) 0.05 % lotion Apply 1 application topically Twice daily as needed. Apply to scalp if a flare-up of alopecia   ELDERBERRY PO Take by mouth.   fexofenadine (ALLEGRA) 180 MG tablet Take 180 mg by mouth daily.   fluticasone (FLONASE) 50 MCG/ACT nasal spray Place 2 sprays into the nose daily as needed. Sinuses   linaclotide (LINZESS) 145 MCG CAPS capsule Take 1 capsule (145 mcg total) by mouth daily before breakfast.   LIVALO 2 MG TABS Take 1 tablet (2 mg total) by mouth daily.   Multiple Vitamin (MULTIVITAMIN) capsule Take 1 capsule by mouth daily.   ondansetron (ZOFRAN) 4 MG tablet Take  1 tablet (4 mg total) by mouth every 8 (eight) hours as needed for nausea or vomiting.   pantoprazole (PROTONIX) 40 MG tablet Take 1 tablet (40 mg total) by mouth daily. 30 minutes before first meal of day.   potassium chloride (K-DUR) 10 MEQ tablet Take 1 tablet (10 mEq total) by mouth 2 (two) times daily.   sucralfate (CARAFATE) 1 g tablet Take 1 tablet (1 g total)  by mouth 2 (two) times daily.   SUMAtriptan (IMITREX) 100 MG tablet Take 100 mg by mouth once. May repeat in 2 hours if headache persists or recurs.   valACYclovir (VALTREX) 500 MG tablet TAKE 1 TABLET BY MOUTH TWICE DAILY FOR 3 DAYS FOR OUTBREAK   valsartan-hydrochlorothiazide (DIOVAN-HCT) 160-25 MG tablet TAKE 1 TABLET BY MOUTH EVERY DAY   Vitamin D, Ergocalciferol, (DRISDOL) 1.25 MG (50000 UNIT) CAPS capsule TAKE 1 CAPSULE BY MOUTH 1 TIME A WEEK   [DISCONTINUED] ibuprofen (ADVIL) 600 MG tablet Take 1 tablet (600 mg total) by mouth every 6 (six) hours as needed for mild pain or moderate pain. For use AFTER surgery   [DISCONTINUED] OZEMPIC, 1 MG/DOSE, 4 MG/3ML SOPN Inject 1 mg into the skin once a week.   [DISCONTINUED] tirzepatide [facility]) 5 MG/0.5ML Pen Inject 5 mg into the skin once a week. (Patient not taking: Reported on 08/04/2023)   No facility-administered medications prior to visit.    Review of Systems  Constitutional: Negative.  Negative for chills, fever, malaise/fatigue and weight loss.  HENT: Negative.    Eyes: Negative.   Respiratory: Negative.  Negative for cough and shortness of breath.   Cardiovascular: Negative.  Negative for chest pain, palpitations and leg swelling.  Gastrointestinal:  Positive for abdominal pain and constipation. Negative for blood in stool, diarrhea, heartburn, melena and vomiting.  Genitourinary: Negative.  Negative for dysuria and flank pain.  Musculoskeletal: Negative.  Negative for joint pain and myalgias.  Skin: Negative.   Neurological: Negative.  Negative for dizziness and  headaches.  Endo/Heme/Allergies: Negative.   Psychiatric/Behavioral: Negative.  Negative for depression and suicidal ideas. The patient is not nervous/anxious.        Objective:   BP (!) 130/90   Pulse 96   Ht 5\' 3"  (1.6 m)   Wt 190 lb 9.6 oz (86.5 kg)   SpO2 97%   BMI 33.76 kg/m   Vitals:   08/04/23 1010  BP: (!) 130/90  Pulse: 96  Height: 5\' 3"  (1.6 m)  Weight: 190 lb 9.6 oz (86.5 kg)  SpO2: 97%  BMI (Calculated): 33.77    Physical Exam Vitals and nursing note reviewed.  Constitutional:      Appearance: Normal appearance.  HENT:     Head: Normocephalic and atraumatic.     Nose: Nose normal.     Mouth/Throat:     Mouth: Mucous membranes are moist.     Pharynx: Oropharynx is clear.  Eyes:     Conjunctiva/sclera: Conjunctivae normal.     Pupils: Pupils are equal, round, and reactive to light.  Cardiovascular:     Rate and Rhythm: Normal rate and regular rhythm.     Pulses: Normal pulses.     Heart sounds: Normal heart sounds. No murmur heard. Pulmonary:     Effort: Pulmonary effort is normal.     Breath sounds: Normal breath sounds. No wheezing.  Abdominal:     General: Bowel sounds are normal.     Palpations: Abdomen is soft.     Tenderness: There is no abdominal tenderness. There is no right CVA tenderness or left CVA tenderness.  Musculoskeletal:        General: Normal range of motion.     Cervical back: Normal range of motion.     Right lower leg: No edema.     Left lower leg: No edema.  Skin:    General: Skin is warm and dry.  Neurological:     General: No focal  deficit present.     Mental Status: She is alert and oriented to person, place, and time.  Psychiatric:        Mood and Affect: Mood normal.        Behavior: Behavior normal.      Results for orders placed or performed in visit on 08/04/23  POCT Urinalysis Dipstick (81002)  Result Value Ref Range   Color, UA yellow    Clarity, UA clear    Glucose, UA Negative Negative   Bilirubin,  UA Negative    Ketones, UA Negative    Spec Grav, UA 1.025 1.010 - 1.025   Blood, UA Negative    pH, UA 6.0 5.0 - 8.0   Protein, UA Negative Negative   Urobilinogen, UA 0.2 0.2 or 1.0 E.U./dL   Nitrite, UA Negative    Leukocytes, UA Negative Negative   Appearance clear    Odor yes     Recent Results (from the past 2160 hours)  Lipid Panel w/o Chol/HDL Ratio     Status: Abnormal   Collection Time: 06/29/23 11:00 AM  Result Value Ref Range   Cholesterol, Total 258 (H) 100 - 199 mg/dL   Triglycerides 161 0 - 149 mg/dL   HDL 67 >09 mg/dL   VLDL Cholesterol Cal 20 5 - 40 mg/dL   LDL Chol Calc (NIH) 604 (H) 0 - 99 mg/dL  [id number]     Status: None   Collection Time: 06/29/23 11:00 AM  Result Value Ref Range   Glucose 89 70 - 99 mg/dL   BUN 10 6 - 24 mg/dL   Creatinine, Ser 4.78 0.57 - 1.00 mg/dL   eGFR 75 >29 FA/OZH/0.86   BUN/Creatinine Ratio 11 9 - 23   Sodium 141 134 - 144 mmol/L   Potassium 4.2 3.5 - 5.2 mmol/L   Chloride 103 96 - 106 mmol/L   CO2 25 20 - 29 mmol/L   Calcium 9.4 8.7 - 10.2 mg/dL   Total Protein 7.2 6.0 - 8.5 g/dL   Albumin 4.3 3.8 - 4.9 g/dL   Globulin, Total 2.9 1.5 - 4.5 g/dL   Bilirubin Total 0.3 0.0 - 1.2 mg/dL   Alkaline Phosphatase 99 44 - 121 IU/L   AST 20 0 - 40 IU/L   ALT 18 0 - 32 IU/L  Hemoglobin A1c     Status: Abnormal   Collection Time: 06/29/23 11:00 AM  Result Value Ref Range   Hgb A1c MFr Bld 5.9 (H) 4.8 - 5.6 %    Comment:          Prediabetes: 5.7 - 6.4          Diabetes: >6.4          Glycemic control for adults with diabetes: <7.0    Est. average glucose Bld gHb Est-mCnc 123 mg/dL  CBC with Diff     Status: Abnormal   Collection Time: 06/29/23 11:00 AM  Result Value Ref Range   WBC 5.3 3.4 - 10.8 x10E3/uL   RBC 5.28 3.77 - 5.28 x10E6/uL   Hemoglobin 13.5 11.1 - 15.9 g/dL   Hematocrit 57.8 46.9 - 46.6 %   MCV 81 79 - 97 fL   MCH 25.6 (L) 26.6 - 33.0 pg   MCHC 31.5 31.5 - 35.7 g/dL   RDW 62.9 52.8 - 41.3 %   Platelets  320 150 - 450 x10E3/uL   Neutrophils 40 Not Estab. %   Lymphs 48 Not Estab. %   Monocytes 9 Not  Estab. %   Eos 2 Not Estab. %   Basos 1 Not Estab. %   Neutrophils Absolute 2.1 1.4 - 7.0 x10E3/uL   Lymphocytes Absolute 2.5 0.7 - 3.1 x10E3/uL   Monocytes Absolute 0.5 0.1 - 0.9 x10E3/uL   EOS (ABSOLUTE) 0.1 0.0 - 0.4 x10E3/uL   Basophils Absolute 0.0 0.0 - 0.2 x10E3/uL   Immature Granulocytes 0 Not Estab. %   Immature Grans (Abs) 0.0 0.0 - 0.1 x10E3/uL  POCT Urinalysis Dipstick (16109)     Status: None   Collection Time: 08/04/23 10:38 AM  Result Value Ref Range   Color, UA yellow    Clarity, UA clear    Glucose, UA Negative Negative   Bilirubin, UA Negative    Ketones, UA Negative    Spec Grav, UA 1.025 1.010 - 1.025   Blood, UA Negative    pH, UA 6.0 5.0 - 8.0   Protein, UA Negative Negative   Urobilinogen, UA 0.2 0.2 or 1.0 E.U./dL   Nitrite, UA Negative    Leukocytes, UA Negative Negative   Appearance clear    Odor yes       Assessment & Plan:  Schedule pelvic ultrasound. Meloxicam for pain control as needed. Resume Linzess.  Patient advised to go to the emergency room if the pain gets worse. Problem List Items Addressed This Visit     Epigastric pain - Primary   Irritable bowel syndrome (IBS)   Essential hypertension, benign   Mixed hyperlipidemia   Other Visit Diagnoses       Pelvic pain       Relevant Medications   meloxicam (MOBIC) 7.5 MG tablet   Other Relevant Orders   US Pelvic Complete With Transvaginal       Follow up 1 week as scheduled.  Total time spent: 30 minutes  Margaretann Loveless, MD  08/04/2023   This document may have been prepared by [facility] Voice Recognition software and as such may include unintentional dictation errors.

## 2023-08-10 ENCOUNTER — Ambulatory Visit
Admission: RE | Admit: 2023-08-10 | Discharge: 2023-08-10 | Disposition: A | Discharge status: Home / Self Care | Source: Ambulatory Visit | Attending: Internal Medicine | Admitting: Internal Medicine

## 2023-08-10 ENCOUNTER — Ambulatory Visit: Payer: 59 | Admitting: Internal Medicine

## 2023-08-10 DIAGNOSIS — R102 Pelvic and perineal pain: Secondary | ICD-10-CM | POA: Diagnosis present

## 2023-08-18 ENCOUNTER — Other Ambulatory Visit: Payer: Self-pay | Admitting: Internal Medicine

## 2023-08-18 DIAGNOSIS — R102 Pelvic and perineal pain unspecified side: Secondary | ICD-10-CM

## 2023-08-18 DIAGNOSIS — R1032 Left lower quadrant pain: Secondary | ICD-10-CM

## 2023-08-18 DIAGNOSIS — N838 Other noninflammatory disorders of ovary, fallopian tube and broad ligament: Secondary | ICD-10-CM

## 2023-08-19 NOTE — Progress Notes (Signed)
 Patient notified

## 2023-08-31 ENCOUNTER — Ambulatory Visit: Admission: RE | Admit: 2023-08-31 | Source: Ambulatory Visit

## 2023-08-31 ENCOUNTER — Other Ambulatory Visit: Payer: Self-pay | Admitting: Internal Medicine

## 2023-08-31 ENCOUNTER — Ambulatory Visit: Admitting: Internal Medicine

## 2023-08-31 DIAGNOSIS — Z91041 Radiographic dye allergy status: Secondary | ICD-10-CM

## 2023-08-31 MED ORDER — DIPHENHYDRAMINE HCL 50 MG PO TABS: ORAL_TABLET | ORAL | 0 refills | Status: DC

## 2023-08-31 MED ORDER — PREDNISONE 50 MG PO TABS: ORAL_TABLET | ORAL | 0 refills | Status: DC

## 2023-09-07 ENCOUNTER — Encounter: Payer: Self-pay | Admitting: Internal Medicine

## 2023-09-07 ENCOUNTER — Ambulatory Visit
Admission: RE | Admit: 2023-09-07 | Discharge: 2023-09-07 | Disposition: A | Discharge status: Home / Self Care | Source: Ambulatory Visit | Attending: Internal Medicine | Admitting: Internal Medicine

## 2023-09-07 ENCOUNTER — Ambulatory Visit (INDEPENDENT_AMBULATORY_CARE_PROVIDER_SITE_OTHER): Admitting: Internal Medicine

## 2023-09-07 VITALS — BP 134/84 | HR 90 | Ht 63.0 in | Wt 188.4 lb

## 2023-09-07 DIAGNOSIS — N838 Other noninflammatory disorders of ovary, fallopian tube and broad ligament: Secondary | ICD-10-CM

## 2023-09-07 DIAGNOSIS — R1032 Left lower quadrant pain: Secondary | ICD-10-CM | POA: Diagnosis not present

## 2023-09-07 DIAGNOSIS — E782 Mixed hyperlipidemia: Secondary | ICD-10-CM

## 2023-09-07 DIAGNOSIS — I10 Essential (primary) hypertension: Secondary | ICD-10-CM | POA: Diagnosis not present

## 2023-09-07 DIAGNOSIS — G8929 Other chronic pain: Secondary | ICD-10-CM | POA: Diagnosis not present

## 2023-09-07 DIAGNOSIS — R102 Pelvic and perineal pain: Secondary | ICD-10-CM | POA: Diagnosis not present

## 2023-09-07 MED ORDER — GADOBUTROL 1 MMOL/ML IV SOLN
8.0000 mL | Freq: Once | INTRAVENOUS | Status: AC | PRN
  Administered 2023-09-07: 8 mL via INTRAVENOUS

## 2023-09-07 NOTE — Progress Notes (Signed)
 Established Patient Office Visit  Subjective:  Patient ID: Jill English, female    DOB: 1966-01-05  Age: 58 y.o. MRN: [id number]  Chief Complaint  Patient presents with   Follow-up    6 week follow up    Patient comes in for follow-up of her left lower quadrant pain.  She had a pelvic ultrasound done which showed an enlarged ovary for her age.  An MRI was recommended which was just completed this morning.  The results are not available at this time.  Patient reports that she is she still feels the discomfort but it is not too bad and does not request any pain medications for control.  Mentions occasional mild nausea but no vomiting, no diarrhea and no constipation.  She is taking all the rest of her medications regularly.    No other concerns at this time.   Past Medical History:  Diagnosis Date   Anxiety    Anxiety    Depression    Diverticulitis Sept 2011   CT documented   GERD (gastroesophageal reflux disease)    Headache    Heart murmur    seen by cardiologist, normal work up   Hemorrhoids    History of hiatal hernia    Hyperlipidemia    Hypertension    moderate obesity    MS (multiple sclerosis) (HCC)    Narcolepsy    Sleep apnea    uses cpap    Past Surgical History:  Procedure Laterality Date   ABDOMINAL HYSTERECTOMY     BACK SURGERY  2004   BREAST BIOPSY Left 05/29/2022   [place] Core BX Coil Clip -  APOCRINE METAPLASIA WITH PAPILLARY FEATURES.   BREAST BIOPSY Left 05/29/2022   [place] LT BREAST BX W LOC DEV 1ST LESION IMG BX SPEC US GUIDE 05/29/2022 [facility]   carpal tunnel repair  2009   COLONOSCOPY  04/07/2011   NWG:[id number] internal hemorrhoids/diverticula in the sigmoid colon   ESOPHAGOGASTRODUODENOSCOPY  04/07/2011   SLF: no evidence of h pylori/gastric body and antral mucosa with minimal chronic inflammation, negative H.pylori   HEMORRHOID SURGERY  07/21/2011   Procedure: HEMORRHOIDECTOMY;  Surgeon: Dalia Heading, MD;  Location: [facility];   Service: General;  Laterality: N/A;   PARTIAL HYSTERECTOMY  2008   with removal of rt ovary   REDUCTION MAMMAPLASTY Bilateral 03/20/2022   TUBAL LIGATION  1992    Social History   Socioeconomic History   Marital status: Divorced    Spouse name: [name]   Number of children: 2   Years of education: college   Highest education level: Not on file  Occupational History    Employer: NOT EMPLOYED  Tobacco Use   Smoking status: Never   Smokeless tobacco: Never  Substance and Sexual Activity   Alcohol use: No   Drug use: No   Sexual activity: Yes  Other Topics Concern   Not on file  Social History Narrative   [name]   Patient has two children.   Patient has a college education.         Social Drivers of Corporate investment banker Strain: Not on file  Food Insecurity: No Food Insecurity (05/25/2023)   Hunger Vital Sign    Worried About Running Out of Food in the Last Year: Never true    Ran Out of Food in the Last Year: Never true  Transportation Needs: No Transportation Needs (05/25/2023)   PRAPARE - Transportation  Lack of Transportation (Medical): No    Lack of Transportation (Non-Medical): No  Physical Activity: Not on file  Stress: No Stress Concern Present (05/25/2023)   [name]    Feeling of Stress : Only a little  Social Connections: Unknown (10/06/2021)   Received from [facility]   Social Network    Social Network: Not on file  Intimate Partner Violence: Not At Risk (05/25/2023)   Humiliation, Afraid, Rape, and Kick questionnaire    Fear of Current or Ex-Partner: No    Emotionally Abused: No    Physically Abused: No    Sexually Abused: No    Family History  Problem Relation Age of Onset   Heart disease Father    Diabetes Father    Cancer Father        prostate   Multiple sclerosis Sister    Diabetes Sister     Hyperlipidemia Sister    Cancer Sister    Cancer Mother        lung   Hypertension Mother    Hyperlipidemia Brother    Other Brother        rare blood disorder   Breast cancer Paternal Aunt    Colon cancer Neg Hx    Anesthesia problems Neg Hx    Hypotension Neg Hx    Malignant hyperthermia Neg Hx    Pseudochol deficiency Neg Hx     Allergies  Allergen Reactions   Contrast Media [Iodinated Contrast Media] Swelling    Facial swelling   Gadolinium Derivatives Itching and Rash    Patient had received contrast before without any adverse effects. About 15 minutes after injection patient began to itch and showed signs of facial rash/swelling for several minutes. Radiologist examined and cleared patient, but suggested premedication for any future contrast injections.    Latex Rash    Outpatient Medications Prior to Visit  Medication Sig   acetaminophen (TYLENOL) 500 MG tablet Take 1 tablet (500 mg total) by mouth every 6 (six) hours as needed. For use AFTER surgery   Bempedoic Acid-Ezetimibe (NEXLIZET) 180-10 MG TABS Take 1 tablet by mouth daily.   cyclobenzaprine (FLEXERIL) 10 MG tablet Take 1 tablet (10 mg total) by mouth 3 (three) times daily as needed for muscle spasms.   desonide (DESOWEN) 0.05 % lotion Apply 1 application topically Twice daily as needed. Apply to scalp if a flare-up of alopecia   diphenhydrAMINE (BENADRYL) 50 MG tablet Take one tablet one hour before the procedure. Bring someone with her to drive her back home   ELDERBERRY PO Take by mouth.   fexofenadine (ALLEGRA) 180 MG tablet Take 180 mg by mouth daily.   fluticasone (FLONASE) 50 MCG/ACT nasal spray Place 2 sprays into the nose daily as needed. Sinuses   linaclotide (LINZESS) 145 MCG CAPS capsule Take 1 capsule (145 mcg total) by mouth daily before breakfast.   LIVALO 2 MG TABS Take 1 tablet (2 mg total) by mouth daily.   meloxicam (MOBIC) 7.5 MG tablet Take 1 tablet (7.5 mg total) by mouth daily.   Multiple  Vitamin (MULTIVITAMIN) capsule Take 1 capsule by mouth daily.   ondansetron (ZOFRAN) 4 MG tablet Take 1 tablet (4 mg total) by mouth every 8 (eight) hours as needed for nausea or vomiting.   pantoprazole (PROTONIX) 40 MG tablet Take 1 tablet (40 mg total) by mouth daily. 30 minutes before first meal of day.   potassium chloride (K-DUR) 10 MEQ  tablet Take 1 tablet (10 mEq total) by mouth 2 (two) times daily.   Semaglutide, 2 MG/DOSE, (OZEMPIC, 2 MG/DOSE,) 8 MG/3ML SOPN INJECT UNDER THE SKIN ONCE A WEEK   sucralfate (CARAFATE) 1 g tablet Take 1 tablet (1 g total) by mouth 2 (two) times daily.   SUMAtriptan (IMITREX) 100 MG tablet Take 100 mg by mouth once. May repeat in 2 hours if headache persists or recurs.   valACYclovir (VALTREX) 500 MG tablet TAKE 1 TABLET BY MOUTH TWICE DAILY FOR 3 DAYS FOR OUTBREAK   valsartan-hydrochlorothiazide (DIOVAN-HCT) 160-25 MG tablet TAKE 1 TABLET BY MOUTH EVERY DAY   Vitamin D, Ergocalciferol, (DRISDOL) 1.25 MG (50000 UNIT) CAPS capsule TAKE 1 CAPSULE BY MOUTH 1 TIME A WEEK   predniSONE (DELTASONE) 50 MG tablet Take 1 tablet 13 hours 7 hours and 3 hours before the procedure (Patient not taking: Reported on 09/07/2023)   No facility-administered medications prior to visit.    Review of Systems  Constitutional: Negative.  Negative for chills, fever, malaise/fatigue and weight loss.  HENT: Negative.  Negative for congestion, ear discharge, nosebleeds and sore throat.   Eyes: Negative.   Respiratory: Negative.  Negative for cough and shortness of breath.   Cardiovascular: Negative.  Negative for chest pain, palpitations and leg swelling.  Gastrointestinal: Negative.  Negative for abdominal pain, constipation, diarrhea, heartburn, nausea and vomiting.  Genitourinary: Negative.  Negative for dysuria and flank pain.  Musculoskeletal: Negative.  Negative for joint pain and myalgias.  Skin: Negative.   Neurological: Negative.  Negative for dizziness, tingling, tremors,  sensory change and headaches.  Endo/Heme/Allergies: Negative.   Psychiatric/Behavioral: Negative.  Negative for depression and suicidal ideas. The patient is not nervous/anxious.        Objective:   BP 134/84   Pulse 90   Ht 5\' 3"  (1.6 m)   Wt 188 lb 6.4 oz (85.5 kg)   SpO2 98%   BMI 33.37 kg/m   Vitals:   09/07/23 1001  BP: 134/84  Pulse: 90  Height: 5\' 3"  (1.6 m)  Weight: 188 lb 6.4 oz (85.5 kg)  SpO2: 98%  BMI (Calculated): 33.38    Physical Exam Vitals and nursing note reviewed.  Constitutional:      Appearance: Normal appearance.  HENT:     Head: Normocephalic and atraumatic.     Nose: Nose normal.     Mouth/Throat:     Mouth: Mucous membranes are moist.     Pharynx: Oropharynx is clear.  Eyes:     Conjunctiva/sclera: Conjunctivae normal.     Pupils: Pupils are equal, round, and reactive to light.  Cardiovascular:     Rate and Rhythm: Normal rate and regular rhythm.     Pulses: Normal pulses.     Heart sounds: Normal heart sounds. No murmur heard. Pulmonary:     Effort: Pulmonary effort is normal.     Breath sounds: Normal breath sounds. No wheezing.  Abdominal:     General: Bowel sounds are normal. There is no distension.     Palpations: Abdomen is soft. There is no mass.     Tenderness: There is no abdominal tenderness. There is no right CVA tenderness, left CVA tenderness or rebound.     Hernia: No hernia is present.  Musculoskeletal:        General: Normal range of motion.     Cervical back: Normal range of motion.     Right lower leg: No edema.     Left lower leg: No  edema.  Skin:    General: Skin is warm and dry.  Neurological:     General: No focal deficit present.     Mental Status: She is alert and oriented to person, place, and time.  Psychiatric:        Mood and Affect: Mood normal.        Behavior: Behavior normal.      No results found for any visits on 09/07/23.  Recent Results (from the past 2160 hours)  Lipid Panel w/o  Chol/HDL Ratio     Status: Abnormal   Collection Time: 06/29/23 11:00 AM  Result Value Ref Range   Cholesterol, Total 258 (H) 100 - 199 mg/dL   Triglycerides 161 0 - 149 mg/dL   HDL 67 >09 mg/dL   VLDL Cholesterol Cal 20 5 - 40 mg/dL   LDL Chol Calc (NIH) 604 (H) 0 - 99 mg/dL  [id number]     Status: None   Collection Time: 06/29/23 11:00 AM  Result Value Ref Range   Glucose 89 70 - 99 mg/dL   BUN 10 6 - 24 mg/dL   Creatinine, Ser 4.78 0.57 - 1.00 mg/dL   eGFR 75 >29 FA/OZH/0.86   BUN/Creatinine Ratio 11 9 - 23   Sodium 141 134 - 144 mmol/L   Potassium 4.2 3.5 - 5.2 mmol/L   Chloride 103 96 - 106 mmol/L   CO2 25 20 - 29 mmol/L   Calcium 9.4 8.7 - 10.2 mg/dL   Total Protein 7.2 6.0 - 8.5 g/dL   Albumin 4.3 3.8 - 4.9 g/dL   Globulin, Total 2.9 1.5 - 4.5 g/dL   Bilirubin Total 0.3 0.0 - 1.2 mg/dL   Alkaline Phosphatase 99 44 - 121 IU/L   AST 20 0 - 40 IU/L   ALT 18 0 - 32 IU/L  Hemoglobin A1c     Status: Abnormal   Collection Time: 06/29/23 11:00 AM  Result Value Ref Range   Hgb A1c MFr Bld 5.9 (H) 4.8 - 5.6 %    Comment:          Prediabetes: 5.7 - 6.4          Diabetes: >6.4          Glycemic control for adults with diabetes: <7.0    Est. average glucose Bld gHb Est-mCnc 123 mg/dL  CBC with Diff     Status: Abnormal   Collection Time: 06/29/23 11:00 AM  Result Value Ref Range   WBC 5.3 3.4 - 10.8 x10E3/uL   RBC 5.28 3.77 - 5.28 x10E6/uL   Hemoglobin 13.5 11.1 - 15.9 g/dL   Hematocrit 57.8 46.9 - 46.6 %   MCV 81 79 - 97 fL   MCH 25.6 (L) 26.6 - 33.0 pg   MCHC 31.5 31.5 - 35.7 g/dL   RDW 62.9 52.8 - 41.3 %   Platelets 320 150 - 450 x10E3/uL   Neutrophils 40 Not Estab. %   Lymphs 48 Not Estab. %   Monocytes 9 Not Estab. %   Eos 2 Not Estab. %   Basos 1 Not Estab. %   Neutrophils Absolute 2.1 1.4 - 7.0 x10E3/uL   Lymphocytes Absolute 2.5 0.7 - 3.1 x10E3/uL   Monocytes Absolute 0.5 0.1 - 0.9 x10E3/uL   EOS (ABSOLUTE) 0.1 0.0 - 0.4 x10E3/uL   Basophils Absolute 0.0  0.0 - 0.2 x10E3/uL   Immature Granulocytes 0 Not Estab. %   Immature Grans (Abs) 0.0 0.0 - 0.1 x10E3/uL  POCT Urinalysis Dipstick (  91478)     Status: None   Collection Time: 08/04/23 10:38 AM  Result Value Ref Range   Color, UA yellow    Clarity, UA clear    Glucose, UA Negative Negative   Bilirubin, UA Negative    Ketones, UA Negative    Spec Grav, UA 1.025 1.010 - 1.025   Blood, UA Negative    pH, UA 6.0 5.0 - 8.0   Protein, UA Negative Negative   Urobilinogen, UA 0.2 0.2 or 1.0 E.U./dL   Nitrite, UA Negative    Leukocytes, UA Negative Negative   Appearance clear    Odor yes       Assessment & Plan:  Continue current medications.  Will call her with the results of pelvic MR once available. Problem List Items Addressed This Visit     Essential hypertension, benign   Mixed hyperlipidemia   Other Visit Diagnoses       Left lower quadrant abdominal pain    -  Primary     Pelvic pain         Enlarged ovary           Return in about 1 month (around 10/07/2023).   Total time spent: 25 minutes  Aisha Hove, MD  09/07/2023   This document may have been prepared by [facility] Voice Recognition software and as such may include unintentional dictation errors.

## 2023-09-21 DIAGNOSIS — M542 Cervicalgia: Secondary | ICD-10-CM | POA: Diagnosis not present

## 2023-09-21 DIAGNOSIS — G35 Multiple sclerosis: Secondary | ICD-10-CM | POA: Diagnosis not present

## 2023-09-21 DIAGNOSIS — G8929 Other chronic pain: Secondary | ICD-10-CM | POA: Diagnosis not present

## 2023-09-21 DIAGNOSIS — R519 Headache, unspecified: Secondary | ICD-10-CM | POA: Diagnosis not present

## 2023-09-22 NOTE — Progress Notes (Signed)
 Patient notified

## 2023-10-05 DIAGNOSIS — M546 Pain in thoracic spine: Secondary | ICD-10-CM | POA: Diagnosis not present

## 2023-10-05 DIAGNOSIS — M79602 Pain in left arm: Secondary | ICD-10-CM | POA: Diagnosis not present

## 2023-10-05 DIAGNOSIS — M542 Cervicalgia: Secondary | ICD-10-CM | POA: Diagnosis not present

## 2023-10-12 ENCOUNTER — Encounter: Payer: Self-pay | Admitting: Internal Medicine

## 2023-10-12 ENCOUNTER — Ambulatory Visit (INDEPENDENT_AMBULATORY_CARE_PROVIDER_SITE_OTHER): Admitting: Internal Medicine

## 2023-10-12 VITALS — BP 112/82 | HR 70 | Ht 63.0 in | Wt 186.8 lb

## 2023-10-12 DIAGNOSIS — E782 Mixed hyperlipidemia: Secondary | ICD-10-CM

## 2023-10-12 DIAGNOSIS — M546 Pain in thoracic spine: Secondary | ICD-10-CM | POA: Diagnosis not present

## 2023-10-12 DIAGNOSIS — R7303 Prediabetes: Secondary | ICD-10-CM | POA: Diagnosis not present

## 2023-10-12 DIAGNOSIS — I10 Essential (primary) hypertension: Secondary | ICD-10-CM | POA: Diagnosis not present

## 2023-10-12 DIAGNOSIS — R1010 Upper abdominal pain, unspecified: Secondary | ICD-10-CM

## 2023-10-12 DIAGNOSIS — E66811 Obesity, class 1: Secondary | ICD-10-CM | POA: Diagnosis not present

## 2023-10-12 DIAGNOSIS — K581 Irritable bowel syndrome with constipation: Secondary | ICD-10-CM

## 2023-10-12 DIAGNOSIS — D508 Other iron deficiency anemias: Secondary | ICD-10-CM

## 2023-10-12 DIAGNOSIS — M79602 Pain in left arm: Secondary | ICD-10-CM | POA: Diagnosis not present

## 2023-10-12 DIAGNOSIS — M542 Cervicalgia: Secondary | ICD-10-CM | POA: Diagnosis not present

## 2023-10-12 NOTE — Progress Notes (Signed)
 Established Patient Office Visit  Subjective:  Patient ID: Jill English, female    DOB: 1966/02/16  Age: 58 y.o. MRN: [id number]  Chief Complaint  Patient presents with   Follow-up    1 month follow up    Patient comes in for a follow-up today.  She has lost some weight with diet control.  She reports while taking Ozempic  2 mg/week it caused upset stomach, nausea so decided to stop it.  However she mentions upper abdominal pain and discomfort, which was there before she started Ozempic  and is still happening.  No diarrhea or constipation and no blood in stools but noticed that her stools are dark-colored.  Patient does have GERD and is currently taking pantoprazole .  Will check labs today and schedule abdominal ultrasound.  Check Hemoccult cards.  Meanwhile stay off Ozempic  for now. She is tolerating Nexlizet  for high cholesterol, check lipid panel today.    No other concerns at this time.   Past Medical History:  Diagnosis Date   Anxiety    Anxiety    Depression    Diverticulitis Sept 2011   CT documented   GERD (gastroesophageal reflux disease)    Headache    Heart murmur    seen by cardiologist, normal work up   Hemorrhoids    History of hiatal hernia    Hyperlipidemia    Hypertension    moderate obesity    MS (multiple sclerosis) (HCC)    Narcolepsy    Sleep apnea    uses cpap    Past Surgical History:  Procedure Laterality Date   ABDOMINAL HYSTERECTOMY     BACK SURGERY  2004   BREAST BIOPSY Left 05/29/2022   US  Core BX Coil Clip -  APOCRINE METAPLASIA WITH PAPILLARY FEATURES.   BREAST BIOPSY Left 05/29/2022   US  LT BREAST BX W LOC DEV 1ST LESION IMG BX SPEC US  GUIDE 05/29/2022 [facility]   carpal tunnel repair  2009   COLONOSCOPY  04/07/2011   [id number] internal hemorrhoids/diverticula in the sigmoid colon   ESOPHAGOGASTRODUODENOSCOPY  04/07/2011   SLF: no evidence of h pylori/gastric body and antral mucosa with minimal chronic inflammation,  negative H.pylori   HEMORRHOID SURGERY  07/21/2011   Procedure: HEMORRHOIDECTOMY;  Surgeon: Beau Bound, MD;  Location: [facility];  Service: General;  Laterality: N/A;   PARTIAL HYSTERECTOMY  2008   with removal of rt ovary   REDUCTION MAMMAPLASTY Bilateral 03/20/2022   TUBAL LIGATION  1992    Social History   Socioeconomic History   Marital status: Divorced    Spouse name: Siegfried Dress   Number of children: 2   Years of education: college   Highest education level: Not on file  Occupational History    Employer: NOT EMPLOYED  Tobacco Use   Smoking status: Never   Smokeless tobacco: Never  Substance and Sexual Activity   Alcohol use: No   Drug use: No   Sexual activity: Yes  Other Topics Concern   Not on file  Social History Narrative   Patient is married Siegfried Dress) and lives with her husband.   Patient has two children.   Patient has a college education.         Social Drivers of Corporate investment banker Strain: Not on file  Food Insecurity: No Food Insecurity (05/25/2023)   Hunger Vital Sign    Worried About Running Out of Food in the Last Year: Never true    Ran Out of Food in the  Last Year: Never true  Transportation Needs: No Transportation Needs (05/25/2023)   PRAPARE - Administrator, Civil Service (Medical): No    Lack of Transportation (Non-Medical): No  Physical Activity: Not on file  Stress: No Stress Concern Present (05/25/2023)   [name]    Feeling of Stress : Only a little  Social Connections: Unknown (10/06/2021)   Received from [facility]   Social Network    Social Network: Not on file  Intimate Partner Violence: Not At Risk (05/25/2023)   Humiliation, Afraid, Rape, and Kick questionnaire    Fear of Current or Ex-Partner: No    Emotionally Abused: No    Physically Abused: No    Sexually Abused: No    Family History  Problem Relation Age of Onset    Heart disease Father    Diabetes Father    Cancer Father        prostate   Multiple sclerosis Sister    Diabetes Sister    Hyperlipidemia Sister    Cancer Sister    Cancer Mother        lung   Hypertension Mother    Hyperlipidemia Brother    Other Brother        rare blood disorder   Breast cancer Paternal Aunt    Colon cancer Neg Hx    Anesthesia problems Neg Hx    Hypotension Neg Hx    Malignant hyperthermia Neg Hx    Pseudochol deficiency Neg Hx     Allergies  Allergen Reactions   Contrast Media [Iodinated Contrast Media] Swelling    Facial swelling   Gadolinium Derivatives Itching and Rash    Patient had received contrast before without any adverse effects. About 15 minutes after injection patient began to itch and showed signs of facial rash/swelling for several minutes. Radiologist examined and cleared patient, but suggested premedication for any future contrast injections.    Latex Rash    Outpatient Medications Prior to Visit  Medication Sig   acetaminophen  (TYLENOL ) 500 MG tablet Take 1 tablet (500 mg total) by mouth every 6 (six) hours as needed. For use AFTER surgery   Bempedoic Acid-Ezetimibe (NEXLIZET ) 180-10 MG TABS Take 1 tablet by mouth daily.   cyclobenzaprine  (FLEXERIL ) 10 MG tablet Take 1 tablet (10 mg total) by mouth 3 (three) times daily as needed for muscle spasms.   desonide (DESOWEN) 0.05 % lotion Apply 1 application topically Twice daily as needed. Apply to scalp if a flare-up of alopecia   ELDERBERRY PO Take by mouth.   fexofenadine (ALLEGRA) 180 MG tablet Take 180 mg by mouth daily.   fluticasone (FLONASE) 50 MCG/ACT nasal spray Place 2 sprays into the nose daily as needed. Sinuses   linaclotide  (LINZESS ) 145 MCG CAPS capsule Take 1 capsule (145 mcg total) by mouth daily before breakfast.   meloxicam  (MOBIC ) 7.5 MG tablet Take 1 tablet (7.5 mg total) by mouth daily.   Multiple Vitamin (MULTIVITAMIN) capsule Take 1 capsule by mouth daily.    ondansetron  (ZOFRAN ) 4 MG tablet Take 1 tablet (4 mg total) by mouth every 8 (eight) hours as needed for nausea or vomiting.   pantoprazole  (PROTONIX ) 40 MG tablet Take 1 tablet (40 mg total) by mouth daily. 30 minutes before first meal of day.   potassium chloride  (K-DUR) 10 MEQ tablet Take 1 tablet (10 mEq total) by mouth 2 (two) times daily.   sucralfate  (CARAFATE ) 1 g tablet  Take 1 tablet (1 g total) by mouth 2 (two) times daily.   SUMAtriptan (IMITREX) 100 MG tablet Take 100 mg by mouth once. May repeat in 2 hours if headache persists or recurs.   valACYclovir (VALTREX) 500 MG tablet TAKE 1 TABLET BY MOUTH TWICE DAILY FOR 3 DAYS FOR OUTBREAK   valsartan-hydrochlorothiazide (DIOVAN-HCT) 160-25 MG tablet TAKE 1 TABLET BY MOUTH EVERY DAY   Vitamin D, Ergocalciferol, (DRISDOL) 1.25 MG (50000 UNIT) CAPS capsule TAKE 1 CAPSULE BY MOUTH 1 TIME A WEEK   [DISCONTINUED] LIVALO  2 MG TABS Take 1 tablet (2 mg total) by mouth daily.   [DISCONTINUED] Semaglutide , 2 MG/DOSE, (OZEMPIC , 2 MG/DOSE,) 8 MG/3ML SOPN INJECT UNDER THE SKIN ONCE A WEEK   [DISCONTINUED] diphenhydrAMINE  (BENADRYL ) 50 MG tablet Take one tablet one hour before the procedure. Bring someone with her to drive her back home (Patient not taking: Reported on 10/12/2023)   [DISCONTINUED] predniSONE  (DELTASONE ) 50 MG tablet Take 1 tablet 13 hours 7 hours and 3 hours before the procedure (Patient not taking: Reported on 10/12/2023)   No facility-administered medications prior to visit.    Review of Systems  Constitutional:  Positive for weight loss. Negative for chills, diaphoresis, fever and malaise/fatigue.  HENT: Negative.    Eyes: Negative.   Respiratory: Negative.  Negative for cough and shortness of breath.   Cardiovascular: Negative.  Negative for chest pain, palpitations and leg swelling.  Gastrointestinal: Negative.  Negative for abdominal pain, constipation, diarrhea, heartburn, nausea and vomiting.  Genitourinary: Negative.  Negative  for dysuria and flank pain.  Musculoskeletal: Negative.  Negative for joint pain and myalgias.  Skin: Negative.   Neurological: Negative.  Negative for dizziness and headaches.  Endo/Heme/Allergies: Negative.   Psychiatric/Behavioral: Negative.  Negative for depression and suicidal ideas. The patient is not nervous/anxious.        Objective:   BP 112/82   Pulse 70   Ht 5\' 3"  (1.6 m)   Wt 186 lb 12.8 oz (84.7 kg)   SpO2 98%   BMI 33.09 kg/m   Vitals:   10/12/23 1006  BP: 112/82  Pulse: 70  Height: 5\' 3"  (1.6 m)  Weight: 186 lb 12.8 oz (84.7 kg)  SpO2: 98%  BMI (Calculated): 33.1    Physical Exam Vitals and nursing note reviewed.  Constitutional:      Appearance: Normal appearance.  HENT:     Head: Normocephalic and atraumatic.     Nose: Nose normal.     Mouth/Throat:     Mouth: Mucous membranes are moist.     Pharynx: Oropharynx is clear.  Eyes:     Conjunctiva/sclera: Conjunctivae normal.     Pupils: Pupils are equal, round, and reactive to light.  Cardiovascular:     Rate and Rhythm: Normal rate and regular rhythm.     Pulses: Normal pulses.     Heart sounds: Normal heart sounds. No murmur heard. Pulmonary:     Effort: Pulmonary effort is normal.     Breath sounds: Normal breath sounds. No wheezing.  Abdominal:     General: Bowel sounds are normal.     Palpations: Abdomen is soft.     Tenderness: There is no abdominal tenderness. There is no right CVA tenderness or left CVA tenderness.  Musculoskeletal:        General: Normal range of motion.     Cervical back: Normal range of motion.     Right lower leg: No edema.     Left lower leg: No  edema.  Skin:    General: Skin is warm and dry.  Neurological:     General: No focal deficit present.     Mental Status: She is alert and oriented to person, place, and time.  Psychiatric:        Mood and Affect: Mood normal.        Behavior: Behavior normal.      No results found for any visits on  10/12/23.  Recent Results (from the past 2160 hours)  POCT Urinalysis Dipstick (16109)     Status: None   Collection Time: 08/04/23 10:38 AM  Result Value Ref Range   Color, UA yellow    Clarity, UA clear    Glucose, UA Negative Negative   Bilirubin, UA Negative    Ketones, UA Negative    Spec Grav, UA 1.025 1.010 - 1.025   Blood, UA Negative    pH, UA 6.0 5.0 - 8.0   Protein, UA Negative Negative   Urobilinogen, UA 0.2 0.2 or 1.0 E.U./dL   Nitrite, UA Negative    Leukocytes, UA Negative Negative   Appearance clear    Odor yes       Assessment & Plan:  Check labs.  Schedule abdominal ultrasound.  Continue other medications. Problem List Items Addressed This Visit     Irritable bowel syndrome (IBS)   Obesity (BMI 30.0-34.9)   Essential hypertension, benign - Primary   Relevant Orders   CMP14+EGFR   Mixed hyperlipidemia   Relevant Orders   Lipid Panel w/o Chol/HDL Ratio   Iron deficiency anemia secondary to inadequate dietary iron intake   Relevant Orders   CBC with Diff   Prediabetes   Relevant Orders   Hemoglobin A1c   Other Visit Diagnoses       Pain of upper abdomen       Relevant Orders   Lipase   US  Abdomen Complete       Return in about 1 week (around 10/19/2023).   Total time spent: 30 minutes  Aisha Hove, MD  10/12/2023   This document may have been prepared by [facility] Voice Recognition software and as such may include unintentional dictation errors.

## 2023-10-13 ENCOUNTER — Ambulatory Visit: Payer: Self-pay | Admitting: Internal Medicine

## 2023-10-13 LAB — CBC WITH DIFFERENTIAL/PLATELET
Basophils Absolute: 0 10*3/uL (ref 0.0–0.2)
Basos: 0 %
EOS (ABSOLUTE): 0.1 10*3/uL (ref 0.0–0.4)
Eos: 2 %
Hematocrit: 46 % (ref 34.0–46.6)
Hemoglobin: 14.2 g/dL (ref 11.1–15.9)
Immature Grans (Abs): 0 10*3/uL (ref 0.0–0.1)
Immature Granulocytes: 0 %
Lymphocytes Absolute: 2.4 10*3/uL (ref 0.7–3.1)
Lymphs: 47 %
MCH: 25.3 pg — ABNORMAL LOW (ref 26.6–33.0)
MCHC: 30.9 g/dL — ABNORMAL LOW (ref 31.5–35.7)
MCV: 82 fL (ref 79–97)
Monocytes Absolute: 0.4 10*3/uL (ref 0.1–0.9)
Monocytes: 8 %
Neutrophils Absolute: 2.1 10*3/uL (ref 1.4–7.0)
Neutrophils: 43 %
Platelets: 300 10*3/uL (ref 150–450)
RBC: 5.62 x10E6/uL — ABNORMAL HIGH (ref 3.77–5.28)
RDW: 14.7 % (ref 11.7–15.4)
WBC: 5 10*3/uL (ref 3.4–10.8)

## 2023-10-13 LAB — CMP14+EGFR
ALT: 19 IU/L (ref 0–32)
AST: 19 IU/L (ref 0–40)
Albumin: 4.6 g/dL (ref 3.8–4.9)
Alkaline Phosphatase: 86 IU/L (ref 44–121)
BUN/Creatinine Ratio: 13 (ref 9–23)
BUN: 12 mg/dL (ref 6–24)
Bilirubin Total: 0.2 mg/dL (ref 0.0–1.2)
CO2: 23 mmol/L (ref 20–29)
Calcium: 9.7 mg/dL (ref 8.7–10.2)
Chloride: 99 mmol/L (ref 96–106)
Creatinine, Ser: 0.89 mg/dL (ref 0.57–1.00)
Globulin, Total: 3.1 g/dL (ref 1.5–4.5)
Glucose: 83 mg/dL (ref 70–99)
Potassium: 4.1 mmol/L (ref 3.5–5.2)
Sodium: 137 mmol/L (ref 134–144)
Total Protein: 7.7 g/dL (ref 6.0–8.5)
eGFR: 77 mL/min/{1.73_m2} (ref >59)

## 2023-10-13 LAB — LIPID PANEL W/O CHOL/HDL RATIO
Cholesterol, Total: 292 mg/dL — ABNORMAL HIGH (ref 100–199)
HDL: 73 mg/dL (ref >39)
LDL Chol Calc (NIH): 201 mg/dL — ABNORMAL HIGH (ref 0–99)
Triglycerides: 103 mg/dL (ref 0–149)
VLDL Cholesterol Cal: 18 mg/dL (ref 5–40)

## 2023-10-13 LAB — HEMOGLOBIN A1C
Est. average glucose Bld gHb Est-mCnc: 120 mg/dL
Hgb A1c MFr Bld: 5.8 % — ABNORMAL HIGH (ref 4.8–5.6)

## 2023-10-13 LAB — LIPASE: Lipase: 54 U/L (ref 14–72)

## 2023-10-13 NOTE — Progress Notes (Signed)
   10/13/2023  Patient ID: Jill English, female   DOB: November 19, 1965, 58 y.o.   MRN: [id number]  Pharmacy Quality Measure Review  This patient is appearing on a report for being at risk of failing the adherence measure for diabetes medications this calendar year.   Medication: Ozempic  2mg  Last fill date: 08/14/23 for 28 day supply  Medication discontinued by patient. No further action needed at this time.   Carnell Christian, PharmD Clinical Pharmacist [phone]

## 2023-10-16 ENCOUNTER — Other Ambulatory Visit: Payer: Self-pay | Admitting: Physician Assistant

## 2023-10-16 DIAGNOSIS — G35 Multiple sclerosis: Secondary | ICD-10-CM

## 2023-10-16 DIAGNOSIS — G35D Multiple sclerosis, unspecified: Secondary | ICD-10-CM

## 2023-10-19 ENCOUNTER — Encounter: Payer: Self-pay | Admitting: Internal Medicine

## 2023-10-19 ENCOUNTER — Ambulatory Visit (INDEPENDENT_AMBULATORY_CARE_PROVIDER_SITE_OTHER): Admitting: Internal Medicine

## 2023-10-19 VITALS — BP 122/74 | HR 76 | Ht 63.0 in | Wt 188.2 lb

## 2023-10-19 DIAGNOSIS — E782 Mixed hyperlipidemia: Secondary | ICD-10-CM | POA: Diagnosis not present

## 2023-10-19 DIAGNOSIS — K581 Irritable bowel syndrome with constipation: Secondary | ICD-10-CM

## 2023-10-19 DIAGNOSIS — R1010 Upper abdominal pain, unspecified: Secondary | ICD-10-CM

## 2023-10-19 DIAGNOSIS — R7303 Prediabetes: Secondary | ICD-10-CM

## 2023-10-19 DIAGNOSIS — E66811 Obesity, class 1: Secondary | ICD-10-CM | POA: Diagnosis not present

## 2023-10-19 DIAGNOSIS — I10 Essential (primary) hypertension: Secondary | ICD-10-CM

## 2023-10-19 NOTE — Progress Notes (Signed)
 Established Patient Office Visit  Subjective:  Patient ID: Debbi Strandberg, female    DOB: [birth year]  Age: 58 y.o. MRN: [id number]  Chief Complaint  Patient presents with   Follow-up    1 week follow up    Patient comes in for her follow-up of abdominal discomfort.  Her ultrasound is scheduled for tomorrow.  However her labs were unremarkable with a normal lipase.  Her LDL cholesterol is still very high, patient admits that she is not taking her Nexlizet  at regularly although she has her prescription at home.  She will start using it daily from now on.  She is taking her Protonix  regularly.  Since she has stopped Ozempic  her bowel movements are becoming more regular.  She has not seen any blood in her stools but still has dark-colored stools.  She has not completed her Hemoccult cards but will drop them off later on this week.    No other concerns at this time.   Past Medical History:  Diagnosis Date   Anxiety    Anxiety    Depression    Diverticulitis Sept 2011   CT documented   GERD (gastroesophageal reflux disease)    Headache    Heart murmur    seen by cardiologist, normal work up   Hemorrhoids    History of hiatal hernia    Hyperlipidemia    Hypertension    moderate obesity    MS (multiple sclerosis) (HCC)    Narcolepsy    Sleep apnea    uses cpap    Past Surgical History:  Procedure Laterality Date   ABDOMINAL HYSTERECTOMY     BACK SURGERY  2004   BREAST BIOPSY Left 05/29/2022   US  Core BX Coil Clip -  APOCRINE METAPLASIA WITH PAPILLARY FEATURES.   BREAST BIOPSY Left 05/29/2022   US  LT BREAST BX W LOC DEV 1ST LESION IMG BX SPEC US  GUIDE 05/29/2022 [facility]   carpal tunnel repair  2009   COLONOSCOPY  04/07/2011   WUJ:WJXBJ internal hemorrhoids/diverticula in the sigmoid colon   ESOPHAGOGASTRODUODENOSCOPY  04/07/2011   SLF: no evidence of h pylori/gastric body and antral mucosa with minimal chronic inflammation, negative H.pylori   HEMORRHOID  SURGERY  07/21/2011   Procedure: HEMORRHOIDECTOMY;  Surgeon: Beau Bound, MD;  Location: [facility];  Service: General;  Laterality: N/A;   PARTIAL HYSTERECTOMY  2008   with removal of rt ovary   REDUCTION MAMMAPLASTY Bilateral 03/20/2022   TUBAL LIGATION  1992    Social History   Socioeconomic History   Marital status: Divorced    Spouse name: Siegfried Dress   Number of children: 2   Years of education: college   Highest education level: Not on file  Occupational History    Employer: NOT EMPLOYED  Tobacco Use   Smoking status: Never   Smokeless tobacco: Never  Substance and Sexual Activity   Alcohol use: No   Drug use: No   Sexual activity: Yes  Other Topics Concern   Not on file  Social History Narrative   Patient is married Siegfried Dress) and lives with her husband.   Patient has two children.   Patient has a college education.         Social Drivers of Corporate investment banker Strain: Not on file  Food Insecurity: No Food Insecurity (05/25/2023)   Hunger Vital Sign    Worried About Running Out of Food in the Last Year: Never true    Ran Out of Food  in the Last Year: Never true  Transportation Needs: No Transportation Needs (05/25/2023)   PRAPARE - Administrator, Civil Service (Medical): No    Lack of Transportation (Non-Medical): No  Physical Activity: Not on file  Stress: No Stress Concern Present (05/25/2023)   [name]    Feeling of Stress : Only a little  Social Connections: Unknown (10/06/2021)   Received from [facility]   Social Network    Social Network: Not on file  Intimate Partner Violence: Not At Risk (05/25/2023)   Humiliation, Afraid, Rape, and Kick questionnaire    Fear of Current or Ex-Partner: No    Emotionally Abused: No    Physically Abused: No    Sexually Abused: No    Family History  Problem Relation Age of Onset   Heart disease Father    Diabetes  Father    Cancer Father        prostate   Multiple sclerosis Sister    Diabetes Sister    Hyperlipidemia Sister    Cancer Sister    Cancer Mother        lung   Hypertension Mother    Hyperlipidemia Brother    Other Brother        rare blood disorder   Breast cancer Paternal Aunt    Colon cancer Neg Hx    Anesthesia problems Neg Hx    Hypotension Neg Hx    Malignant hyperthermia Neg Hx    Pseudochol deficiency Neg Hx     Allergies  Allergen Reactions   Contrast Media [Iodinated Contrast Media] Swelling    Facial swelling   Gadolinium Derivatives Itching and Rash    Patient had received contrast before without any adverse effects. About 15 minutes after injection patient began to itch and showed signs of facial rash/swelling for several minutes. Radiologist examined and cleared patient, but suggested premedication for any future contrast injections.    Latex Rash    Outpatient Medications Prior to Visit  Medication Sig   acetaminophen  (TYLENOL ) 500 MG tablet Take 1 tablet (500 mg total) by mouth every 6 (six) hours as needed. For use AFTER surgery   Bempedoic Acid-Ezetimibe (NEXLIZET ) 180-10 MG TABS Take 1 tablet by mouth daily.   cyclobenzaprine  (FLEXERIL ) 10 MG tablet Take 1 tablet (10 mg total) by mouth 3 (three) times daily as needed for muscle spasms.   desonide (DESOWEN) 0.05 % lotion Apply 1 application topically Twice daily as needed. Apply to scalp if a flare-up of alopecia   ELDERBERRY PO Take by mouth.   fexofenadine (ALLEGRA) 180 MG tablet Take 180 mg by mouth daily.   fluticasone (FLONASE) 50 MCG/ACT nasal spray Place 2 sprays into the nose daily as needed. Sinuses   linaclotide  (LINZESS ) 145 MCG CAPS capsule Take 1 capsule (145 mcg total) by mouth daily before breakfast.   meloxicam  (MOBIC ) 7.5 MG tablet Take 1 tablet (7.5 mg total) by mouth daily.   Multiple Vitamin (MULTIVITAMIN) capsule Take 1 capsule by mouth daily.   ondansetron  (ZOFRAN ) 4 MG tablet Take 1  tablet (4 mg total) by mouth every 8 (eight) hours as needed for nausea or vomiting.   pantoprazole  (PROTONIX ) 40 MG tablet Take 1 tablet (40 mg total) by mouth daily. 30 minutes before first meal of day.   potassium chloride  (K-DUR) 10 MEQ tablet Take 1 tablet (10 mEq total) by mouth 2 (two) times daily.   sucralfate  (CARAFATE ) 1  g tablet Take 1 tablet (1 g total) by mouth 2 (two) times daily.   SUMAtriptan (IMITREX) 100 MG tablet Take 100 mg by mouth once. May repeat in 2 hours if headache persists or recurs.   valACYclovir (VALTREX) 500 MG tablet TAKE 1 TABLET BY MOUTH TWICE DAILY FOR 3 DAYS FOR OUTBREAK   valsartan-hydrochlorothiazide (DIOVAN-HCT) 160-25 MG tablet TAKE 1 TABLET BY MOUTH EVERY DAY   Vitamin D, Ergocalciferol, (DRISDOL) 1.25 MG (50000 UNIT) CAPS capsule TAKE 1 CAPSULE BY MOUTH 1 TIME A WEEK   No facility-administered medications prior to visit.    Review of Systems  Constitutional: Negative.   HENT: Negative.  Negative for nosebleeds and sore throat.   Eyes: Negative.   Respiratory: Negative.  Negative for cough and shortness of breath.   Cardiovascular: Negative.  Negative for chest pain, palpitations and leg swelling.  Gastrointestinal:  Positive for abdominal pain. Negative for blood in stool, constipation, diarrhea, heartburn, melena, nausea and vomiting.  Genitourinary: Negative.  Negative for dysuria and flank pain.  Musculoskeletal: Negative.  Negative for joint pain and myalgias.  Skin: Negative.   Neurological: Negative.  Negative for dizziness and headaches.  Endo/Heme/Allergies: Negative.   Psychiatric/Behavioral: Negative.  Negative for depression and suicidal ideas. The patient is not nervous/anxious.        Objective:   BP 122/74   Pulse 76   Ht 5\' 3"  (1.6 m)   Wt 188 lb 3.2 oz (85.4 kg)   SpO2 98%   BMI 33.34 kg/m   Vitals:   10/19/23 1015  BP: 122/74  Pulse: 76  Height: 5\' 3"  (1.6 m)  Weight: 188 lb 3.2 oz (85.4 kg)  SpO2: 98%  BMI  (Calculated): 33.35    Physical Exam Vitals and nursing note reviewed.  Constitutional:      Appearance: Normal appearance.  HENT:     Head: Normocephalic and atraumatic.     Nose: Nose normal.     Mouth/Throat:     Mouth: Mucous membranes are moist.     Pharynx: Oropharynx is clear.  Eyes:     Conjunctiva/sclera: Conjunctivae normal.     Pupils: Pupils are equal, round, and reactive to light.  Cardiovascular:     Rate and Rhythm: Normal rate and regular rhythm.     Pulses: Normal pulses.     Heart sounds: Normal heart sounds. No murmur heard. Pulmonary:     Effort: Pulmonary effort is normal.     Breath sounds: Normal breath sounds. No wheezing.  Abdominal:     General: Bowel sounds are normal.     Palpations: Abdomen is soft.     Tenderness: There is no abdominal tenderness. There is no right CVA tenderness or left CVA tenderness.  Musculoskeletal:        General: Normal range of motion.     Cervical back: Normal range of motion.     Right lower leg: No edema.     Left lower leg: No edema.  Skin:    General: Skin is warm and dry.  Neurological:     General: No focal deficit present.     Mental Status: She is alert and oriented to person, place, and time.  Psychiatric:        Mood and Affect: Mood normal.        Behavior: Behavior normal.      No results found for any visits on 10/19/23.  Recent Results (from the past 2160 hours)  POCT Urinalysis Dipstick (81002)  Status: None   Collection Time: 08/04/23 10:38 AM  Result Value Ref Range   Color, UA yellow    Clarity, UA clear    Glucose, UA Negative Negative   Bilirubin, UA Negative    Ketones, UA Negative    Spec Grav, UA 1.025 1.010 - 1.025   Blood, UA Negative    pH, UA 6.0 5.0 - 8.0   Protein, UA Negative Negative   Urobilinogen, UA 0.2 0.2 or 1.0 E.U./dL   Nitrite, UA Negative    Leukocytes, UA Negative Negative   Appearance clear    Odor yes   Lipid Panel w/o Chol/HDL Ratio     Status:  Abnormal   Collection Time: 10/12/23 10:41 AM  Result Value Ref Range   Cholesterol, Total 292 (H) 100 - 199 mg/dL   Triglycerides 914 0 - 149 mg/dL   HDL 73 >78 mg/dL   VLDL Cholesterol Cal 18 5 - 40 mg/dL   LDL Chol Calc (NIH) 295 (H) 0 - 99 mg/dL   LDL CALC COMMENT: Comment     Comment: Consider evaluating for Familial Hypercholesterolemia(FH), if clinically indicated.   CMP14+EGFR     Status: None   Collection Time: 10/12/23 10:41 AM  Result Value Ref Range   Glucose 83 70 - 99 mg/dL   BUN 12 6 - 24 mg/dL   Creatinine, Ser 6.21 0.57 - 1.00 mg/dL   eGFR 77 >30 QM/VHQ/4.69   BUN/Creatinine Ratio 13 9 - 23   Sodium 137 134 - 144 mmol/L   Potassium 4.1 3.5 - 5.2 mmol/L   Chloride 99 96 - 106 mmol/L   CO2 23 20 - 29 mmol/L   Calcium 9.7 8.7 - 10.2 mg/dL   Total Protein 7.7 6.0 - 8.5 g/dL   Albumin 4.6 3.8 - 4.9 g/dL   Globulin, Total 3.1 1.5 - 4.5 g/dL   Bilirubin Total 0.2 0.0 - 1.2 mg/dL   Alkaline Phosphatase 86 44 - 121 IU/L   AST 19 0 - 40 IU/L   ALT 19 0 - 32 IU/L  CBC with Diff     Status: Abnormal   Collection Time: 10/12/23 10:41 AM  Result Value Ref Range   WBC 5.0 3.4 - 10.8 x10E3/uL   RBC 5.62 (H) 3.77 - 5.28 x10E6/uL   Hemoglobin 14.2 11.1 - 15.9 g/dL   Hematocrit 62.9 52.8 - 46.6 %   MCV 82 79 - 97 fL   MCH 25.3 (L) 26.6 - 33.0 pg   MCHC 30.9 (L) 31.5 - 35.7 g/dL   RDW 41.3 24.4 - 01.0 %   Platelets 300 150 - 450 x10E3/uL   Neutrophils 43 Not Estab. %   Lymphs 47 Not Estab. %   Monocytes 8 Not Estab. %   Eos 2 Not Estab. %   Basos 0 Not Estab. %   Neutrophils Absolute 2.1 1.4 - 7.0 x10E3/uL   Lymphocytes Absolute 2.4 0.7 - 3.1 x10E3/uL   Monocytes Absolute 0.4 0.1 - 0.9 x10E3/uL   EOS (ABSOLUTE) 0.1 0.0 - 0.4 x10E3/uL   Basophils Absolute 0.0 0.0 - 0.2 x10E3/uL   Immature Granulocytes 0 Not Estab. %   Immature Grans (Abs) 0.0 0.0 - 0.1 x10E3/uL  Hemoglobin A1c     Status: Abnormal   Collection Time: 10/12/23 10:41 AM  Result Value Ref Range    Hgb A1c MFr Bld 5.8 (H) 4.8 - 5.6 %    Comment:          Prediabetes: 5.7 - 6.4  Diabetes: >6.4          Glycemic control for adults with diabetes: <7.0    Est. average glucose Bld gHb Est-mCnc 120 mg/dL  Lipase     Status: None   Collection Time: 10/12/23 10:41 AM  Result Value Ref Range   Lipase 54 14 - 72 U/L      Assessment & Plan:  Patient advised to resume her Said and to take it regularly.  Also advised to avoid spicy food.  Needs to take her Protonix  regularly as well.  To proceed with her abdominal ultrasound.  Will discuss results and consider Zepbound  at follow-up. Problem List Items Addressed This Visit     Irritable bowel syndrome (IBS)   Obesity (BMI 30.0-34.9)   Essential hypertension, benign - Primary   Mixed hyperlipidemia   Other Visit Diagnoses       Pain of upper abdomen           Return in about 2 weeks (around 11/02/2023).   Total time spent: 30 minutes  Aisha Hove, MD  10/19/2023   This document may have been prepared by [facility] Voice Recognition software and as such may include unintentional dictation errors.

## 2023-10-20 ENCOUNTER — Ambulatory Visit
Admission: RE | Admit: 2023-10-20 | Discharge: 2023-10-20 | Disposition: A | Discharge status: Home / Self Care | Source: Ambulatory Visit | Attending: Internal Medicine | Admitting: Internal Medicine

## 2023-10-20 DIAGNOSIS — R109 Unspecified abdominal pain: Secondary | ICD-10-CM | POA: Diagnosis not present

## 2023-10-20 DIAGNOSIS — R1010 Upper abdominal pain, unspecified: Secondary | ICD-10-CM | POA: Diagnosis not present

## 2023-10-20 NOTE — Progress Notes (Signed)
 Patient notified

## 2023-10-27 DIAGNOSIS — M546 Pain in thoracic spine: Secondary | ICD-10-CM | POA: Diagnosis not present

## 2023-10-27 DIAGNOSIS — M542 Cervicalgia: Secondary | ICD-10-CM | POA: Diagnosis not present

## 2023-10-27 DIAGNOSIS — M79602 Pain in left arm: Secondary | ICD-10-CM | POA: Diagnosis not present

## 2023-10-27 DIAGNOSIS — G4733 Obstructive sleep apnea (adult) (pediatric): Secondary | ICD-10-CM | POA: Diagnosis not present

## 2023-11-02 ENCOUNTER — Encounter: Payer: Self-pay | Admitting: Internal Medicine

## 2023-11-02 ENCOUNTER — Ambulatory Visit: Admission: RE | Admit: 2023-11-02 | Source: Ambulatory Visit

## 2023-11-02 ENCOUNTER — Ambulatory Visit (INDEPENDENT_AMBULATORY_CARE_PROVIDER_SITE_OTHER): Admitting: Internal Medicine

## 2023-11-02 VITALS — BP 126/86 | HR 72 | Ht 63.0 in | Wt 194.0 lb

## 2023-11-02 DIAGNOSIS — E66811 Obesity, class 1: Secondary | ICD-10-CM

## 2023-11-02 DIAGNOSIS — M542 Cervicalgia: Secondary | ICD-10-CM | POA: Diagnosis not present

## 2023-11-02 DIAGNOSIS — R7303 Prediabetes: Secondary | ICD-10-CM | POA: Diagnosis not present

## 2023-11-02 DIAGNOSIS — M79602 Pain in left arm: Secondary | ICD-10-CM | POA: Diagnosis not present

## 2023-11-02 DIAGNOSIS — M546 Pain in thoracic spine: Secondary | ICD-10-CM | POA: Diagnosis not present

## 2023-11-02 DIAGNOSIS — K581 Irritable bowel syndrome with constipation: Secondary | ICD-10-CM | POA: Diagnosis not present

## 2023-11-02 DIAGNOSIS — I10 Essential (primary) hypertension: Secondary | ICD-10-CM

## 2023-11-02 DIAGNOSIS — E782 Mixed hyperlipidemia: Secondary | ICD-10-CM

## 2023-11-02 MED ORDER — SEMAGLUTIDE (1 MG/DOSE) 4 MG/3ML ~~LOC~~ SOPN: 1.0000 mg | PEN_INJECTOR | SUBCUTANEOUS | 3 refills | Status: DC

## 2023-11-02 NOTE — Progress Notes (Signed)
 Established Patient Office Visit  Subjective:  Patient ID: Jill English, female    DOB: 1966/01/29  Age: 58 y.o. MRN: [id number]  Chief Complaint  Patient presents with   Follow-up    2 week follow up    Patient comes in for follow-up today.  She is feeling much better and her abdominal symptoms are almost completely resolved.  Her ultrasound results also discussed today.  Now she is willing to resume Ozempic  but at 1 mg/week.  Denies nausea vomiting or abdominal pain.  No diarrhea or constipation.    No other concerns at this time.   Past Medical History:  Diagnosis Date   Anxiety    Anxiety    Depression    Diverticulitis Sept 2011   CT documented   GERD (gastroesophageal reflux disease)    Headache    Heart murmur    seen by cardiologist, normal work up   Hemorrhoids    History of hiatal hernia    Hyperlipidemia    Hypertension    moderate obesity    MS (multiple sclerosis) (HCC)    Narcolepsy    Sleep apnea    uses cpap    Past Surgical History:  Procedure Laterality Date   ABDOMINAL HYSTERECTOMY     BACK SURGERY  2004   BREAST BIOPSY Left 05/29/2022   US  Core BX Coil Clip -  APOCRINE METAPLASIA WITH PAPILLARY FEATURES.   BREAST BIOPSY Left 05/29/2022   US  LT BREAST BX W LOC DEV 1ST LESION IMG BX SPEC US  GUIDE 05/29/2022 [facility]   carpal tunnel repair  2009   COLONOSCOPY  04/07/2011   YNW:GNFAO internal hemorrhoids/diverticula in the sigmoid colon   ESOPHAGOGASTRODUODENOSCOPY  04/07/2011   SLF: no evidence of h pylori/gastric body and antral mucosa with minimal chronic inflammation, negative H.pylori   HEMORRHOID SURGERY  07/21/2011   Procedure: HEMORRHOIDECTOMY;  Surgeon: Beau Bound, MD;  Location: [facility];  Service: General;  Laterality: N/A;   PARTIAL HYSTERECTOMY  2008   with removal of rt ovary   REDUCTION MAMMAPLASTY Bilateral 03/20/2022   TUBAL LIGATION  1992    Social History   Socioeconomic History   Marital status:  Divorced    Spouse name: Siegfried Dress   Number of children: 2   Years of education: college   Highest education level: Not on file  Occupational History    Employer: NOT EMPLOYED  Tobacco Use   Smoking status: Never   Smokeless tobacco: Never  Substance and Sexual Activity   Alcohol use: No   Drug use: No   Sexual activity: Yes  Other Topics Concern   Not on file  Social History Narrative   Patient is married Siegfried Dress) and lives with her husband.   Patient has two children.   Patient has a college education.         Social Drivers of Corporate investment banker Strain: Not on file  Food Insecurity: No Food Insecurity (05/25/2023)   Hunger Vital Sign    Worried About Running Out of Food in the Last Year: Never true    Ran Out of Food in the Last Year: Never true  Transportation Needs: No Transportation Needs (05/25/2023)   PRAPARE - Administrator, Civil Service (Medical): No    Lack of Transportation (Non-Medical): No  Physical Activity: Not on file  Stress: No Stress Concern Present (05/25/2023)   [name]    Feeling of  Stress : Only a little  Social Connections: Unknown (10/06/2021)   Received from [facility]   Social Network    Social Network: Not on file  Intimate Partner Violence: Not At Risk (05/25/2023)   Humiliation, Afraid, Rape, and Kick questionnaire    Fear of Current or Ex-Partner: No    Emotionally Abused: No    Physically Abused: No    Sexually Abused: No    Family History  Problem Relation Age of Onset   Heart disease Father    Diabetes Father    Cancer Father        prostate   Multiple sclerosis Sister    Diabetes Sister    Hyperlipidemia Sister    Cancer Sister    Cancer Mother        lung   Hypertension Mother    Hyperlipidemia Brother    Other Brother        rare blood disorder   Breast cancer Paternal Aunt    Colon cancer Neg Hx    Anesthesia  problems Neg Hx    Hypotension Neg Hx    Malignant hyperthermia Neg Hx    Pseudochol deficiency Neg Hx     Allergies  Allergen Reactions   Contrast Media [Iodinated Contrast Media] Swelling    Facial swelling   Gadolinium Derivatives Itching and Rash    Patient had received contrast before without any adverse effects. About 15 minutes after injection patient began to itch and showed signs of facial rash/swelling for several minutes. Radiologist examined and cleared patient, but suggested premedication for any future contrast injections.    Latex Rash    Outpatient Medications Prior to Visit  Medication Sig   acetaminophen  (TYLENOL ) 500 MG tablet Take 1 tablet (500 mg total) by mouth every 6 (six) hours as needed. For use AFTER surgery   Bempedoic Acid-Ezetimibe (NEXLIZET ) 180-10 MG TABS Take 1 tablet by mouth daily.   cyclobenzaprine  (FLEXERIL ) 10 MG tablet Take 1 tablet (10 mg total) by mouth 3 (three) times daily as needed for muscle spasms.   desonide (DESOWEN) 0.05 % lotion Apply 1 application topically Twice daily as needed. Apply to scalp if a flare-up of alopecia   ELDERBERRY PO Take by mouth.   fexofenadine (ALLEGRA) 180 MG tablet Take 180 mg by mouth daily.   fluticasone (FLONASE) 50 MCG/ACT nasal spray Place 2 sprays into the nose daily as needed. Sinuses   linaclotide  (LINZESS ) 145 MCG CAPS capsule Take 1 capsule (145 mcg total) by mouth daily before breakfast.   meloxicam  (MOBIC ) 7.5 MG tablet Take 1 tablet (7.5 mg total) by mouth daily.   Multiple Vitamin (MULTIVITAMIN) capsule Take 1 capsule by mouth daily.   ondansetron  (ZOFRAN ) 4 MG tablet Take 1 tablet (4 mg total) by mouth every 8 (eight) hours as needed for nausea or vomiting.   pantoprazole  (PROTONIX ) 40 MG tablet Take 1 tablet (40 mg total) by mouth daily. 30 minutes before first meal of day.   potassium chloride  (K-DUR) 10 MEQ tablet Take 1 tablet (10 mEq total) by mouth 2 (two) times daily.   sucralfate  (CARAFATE )  1 g tablet Take 1 tablet (1 g total) by mouth 2 (two) times daily.   SUMAtriptan (IMITREX) 100 MG tablet Take 100 mg by mouth once. May repeat in 2 hours if headache persists or recurs.   valACYclovir (VALTREX) 500 MG tablet TAKE 1 TABLET BY MOUTH TWICE DAILY FOR 3 DAYS FOR OUTBREAK   valsartan-hydrochlorothiazide (DIOVAN-HCT) 160-25 MG tablet  TAKE 1 TABLET BY MOUTH EVERY DAY   Vitamin D, Ergocalciferol, (DRISDOL) 1.25 MG (50000 UNIT) CAPS capsule TAKE 1 CAPSULE BY MOUTH 1 TIME A WEEK   No facility-administered medications prior to visit.    Review of Systems  Constitutional: Negative.  Negative for chills, fever, malaise/fatigue and weight loss.  HENT: Negative.  Negative for sore throat.   Eyes: Negative.   Respiratory: Negative.  Negative for cough and shortness of breath.   Cardiovascular: Negative.  Negative for chest pain, palpitations and leg swelling.  Gastrointestinal: Negative.  Negative for abdominal pain, blood in stool, constipation, diarrhea, heartburn, melena, nausea and vomiting.  Genitourinary: Negative.  Negative for dysuria and flank pain.  Musculoskeletal: Negative.  Negative for joint pain and myalgias.  Skin: Negative.   Neurological: Negative.  Negative for dizziness, tingling and headaches.  Endo/Heme/Allergies: Negative.   Psychiatric/Behavioral: Negative.  Negative for depression and suicidal ideas. The patient is not nervous/anxious.        Objective:   BP 126/86   Pulse 72   Ht 5\' 3"  (1.6 m)   Wt 194 lb (88 kg)   SpO2 98%   BMI 34.37 kg/m   Vitals:   11/02/23 1038  BP: 126/86  Pulse: 72  Height: 5\' 3"  (1.6 m)  Weight: 194 lb (88 kg)  SpO2: 98%  BMI (Calculated): 34.37    Physical Exam Vitals and nursing note reviewed.  Constitutional:      Appearance: Normal appearance.  HENT:     Head: Normocephalic and atraumatic.     Nose: Nose normal.     Mouth/Throat:     Mouth: Mucous membranes are moist.     Pharynx: Oropharynx is clear.   Eyes:     Conjunctiva/sclera: Conjunctivae normal.     Pupils: Pupils are equal, round, and reactive to light.  Cardiovascular:     Rate and Rhythm: Normal rate and regular rhythm.     Pulses: Normal pulses.     Heart sounds: Normal heart sounds. No murmur heard. Pulmonary:     Effort: Pulmonary effort is normal.     Breath sounds: Normal breath sounds. No wheezing.  Abdominal:     General: Bowel sounds are normal.     Palpations: Abdomen is soft.     Tenderness: There is no abdominal tenderness. There is no right CVA tenderness or left CVA tenderness.  Musculoskeletal:        General: Normal range of motion.     Cervical back: Normal range of motion.     Right lower leg: No edema.     Left lower leg: No edema.  Skin:    General: Skin is warm and dry.  Neurological:     General: No focal deficit present.     Mental Status: She is alert and oriented to person, place, and time.  Psychiatric:        Mood and Affect: Mood normal.        Behavior: Behavior normal.      No results found for any visits on 11/02/23.  Recent Results (from the past 2160 hours)  Lipid Panel w/o Chol/HDL Ratio     Status: Abnormal   Collection Time: 10/12/23 10:41 AM  Result Value Ref Range   Cholesterol, Total 292 (H) 100 - 199 mg/dL   Triglycerides 098 0 - 149 mg/dL   HDL 73 >11 mg/dL   VLDL Cholesterol Cal 18 5 - 40 mg/dL   LDL Chol Calc (NIH) 914 (H) 0 -  99 mg/dL   LDL CALC COMMENT: Comment     Comment: Consider evaluating for Familial Hypercholesterolemia(FH), if clinically indicated.   CMP14+EGFR     Status: None   Collection Time: 10/12/23 10:41 AM  Result Value Ref Range   Glucose 83 70 - 99 mg/dL   BUN 12 6 - 24 mg/dL   Creatinine, Ser 1.61 0.57 - 1.00 mg/dL   eGFR 77 >09 UE/AVW/0.98   BUN/Creatinine Ratio 13 9 - 23   Sodium 137 134 - 144 mmol/L   Potassium 4.1 3.5 - 5.2 mmol/L   Chloride 99 96 - 106 mmol/L   CO2 23 20 - 29 mmol/L   Calcium 9.7 8.7 - 10.2 mg/dL   Total  Protein 7.7 6.0 - 8.5 g/dL   Albumin 4.6 3.8 - 4.9 g/dL   Globulin, Total 3.1 1.5 - 4.5 g/dL   Bilirubin Total 0.2 0.0 - 1.2 mg/dL   Alkaline Phosphatase 86 44 - 121 IU/L   AST 19 0 - 40 IU/L   ALT 19 0 - 32 IU/L  CBC with Diff     Status: Abnormal   Collection Time: 10/12/23 10:41 AM  Result Value Ref Range   WBC 5.0 3.4 - 10.8 x10E3/uL   RBC 5.62 (H) 3.77 - 5.28 x10E6/uL   Hemoglobin 14.2 11.1 - 15.9 g/dL   Hematocrit 11.9 14.7 - 46.6 %   MCV 82 79 - 97 fL   MCH 25.3 (L) 26.6 - 33.0 pg   MCHC 30.9 (L) 31.5 - 35.7 g/dL   RDW 82.9 56.2 - 13.0 %   Platelets 300 150 - 450 x10E3/uL   Neutrophils 43 Not Estab. %   Lymphs 47 Not Estab. %   Monocytes 8 Not Estab. %   Eos 2 Not Estab. %   Basos 0 Not Estab. %   Neutrophils Absolute 2.1 1.4 - 7.0 x10E3/uL   Lymphocytes Absolute 2.4 0.7 - 3.1 x10E3/uL   Monocytes Absolute 0.4 0.1 - 0.9 x10E3/uL   EOS (ABSOLUTE) 0.1 0.0 - 0.4 x10E3/uL   Basophils Absolute 0.0 0.0 - 0.2 x10E3/uL   Immature Granulocytes 0 Not Estab. %   Immature Grans (Abs) 0.0 0.0 - 0.1 x10E3/uL  Hemoglobin A1c     Status: Abnormal   Collection Time: 10/12/23 10:41 AM  Result Value Ref Range   Hgb A1c MFr Bld 5.8 (H) 4.8 - 5.6 %    Comment:          Prediabetes: 5.7 - 6.4          Diabetes: >6.4          Glycemic control for adults with diabetes: <7.0    Est. average glucose Bld gHb Est-mCnc 120 mg/dL  Lipase     Status: None   Collection Time: 10/12/23 10:41 AM  Result Value Ref Range   Lipase 54 14 - 72 U/L      Assessment & Plan:  Continue current medications.  Strict diet control emphasized.  Resume Ozempic  at 1 mg/week. Problem List Items Addressed This Visit     Irritable bowel syndrome (IBS)   Obesity (BMI 30.0-34.9)   Relevant Medications   Semaglutide , 1 MG/DOSE, 4 MG/3ML SOPN   Essential hypertension, benign - Primary   Mixed hyperlipidemia   Prediabetes   Relevant Medications   Semaglutide , 1 MG/DOSE, 4 MG/3ML SOPN    Return in about 1  month (around 12/02/2023).   Total time spent: 30 minutes  Aisha Hove, MD  11/02/2023   This  document may have been prepared by [employer] Voice Recognition software and as such may include unintentional dictation errors.

## 2023-11-03 ENCOUNTER — Other Ambulatory Visit: Payer: Self-pay | Admitting: Physician Assistant

## 2023-11-03 ENCOUNTER — Ambulatory Visit
Admission: RE | Admit: 2023-11-03 | Discharge: 2023-11-03 | Disposition: A | Discharge status: Home / Self Care | Source: Ambulatory Visit | Attending: Physician Assistant | Admitting: Physician Assistant

## 2023-11-03 DIAGNOSIS — G35 Multiple sclerosis: Secondary | ICD-10-CM

## 2023-11-03 DIAGNOSIS — M542 Cervicalgia: Secondary | ICD-10-CM

## 2023-11-03 DIAGNOSIS — R9082 White matter disease, unspecified: Secondary | ICD-10-CM | POA: Diagnosis not present

## 2023-11-09 ENCOUNTER — Ambulatory Visit

## 2023-11-17 DIAGNOSIS — M79602 Pain in left arm: Secondary | ICD-10-CM | POA: Diagnosis not present

## 2023-11-17 DIAGNOSIS — M546 Pain in thoracic spine: Secondary | ICD-10-CM | POA: Diagnosis not present

## 2023-11-17 DIAGNOSIS — M542 Cervicalgia: Secondary | ICD-10-CM | POA: Diagnosis not present

## 2023-11-30 ENCOUNTER — Ambulatory Visit: Admitting: Internal Medicine

## 2023-12-07 ENCOUNTER — Other Ambulatory Visit: Payer: Self-pay | Admitting: Internal Medicine

## 2023-12-08 IMAGING — MR MR HEAD W/O CM
12 series · 48 of 48 positions shown · non-contrast
Comparison: 03/20/2019

CLINICAL DATA: History of MS, facial numbness and bilateral hand
tingling neck pain

EXAM:
MRI HEAD WITHOUT CONTRAST
TECHNIQUE: Multiplanar, multiecho pulse sequences of the brain and surrounding
structures were obtained without intravenous contrast.

[Series 5: ax dwi_tracew · axial · 3.0mm · 0.65mm/px · z∈[-40,+107]mm · 4 of 48 slices shown]
[im 1/48]
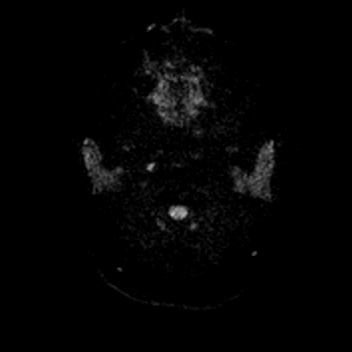
[im 16/48]
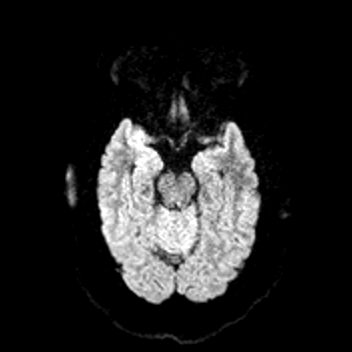
[im 32/48]
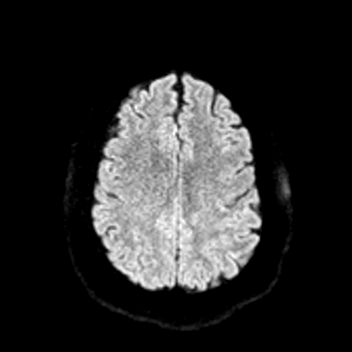
[im 48/48]
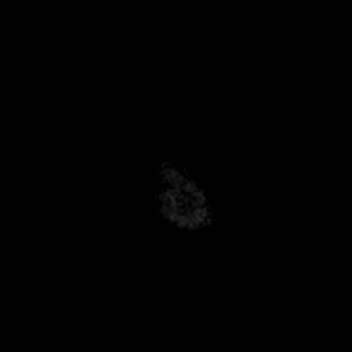

[Series 6: ax dwi_adc · axial · 3.0mm · 0.65mm/px · z∈[-40,+104]mm · 3 of 46 slices shown]
[im 1/46]
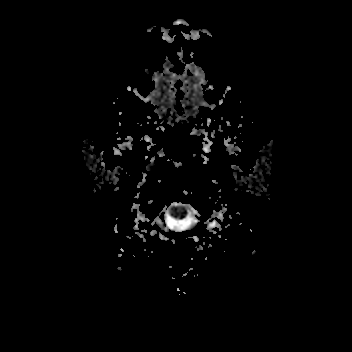
[im 23/46]
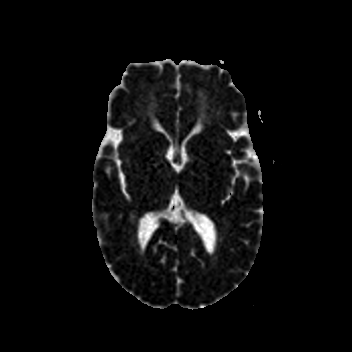
[im 46/46]
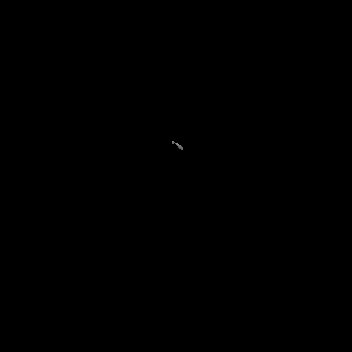

[Series 7: cor dwi_tracew · coronal · 5.0mm · 0.65mm/px · 3 of 40 slices shown]
[im 1/40]
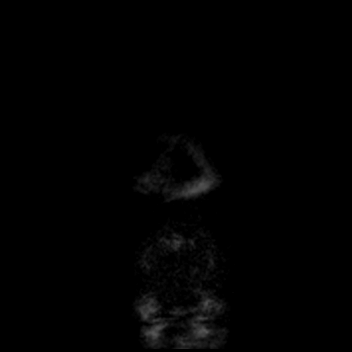
[im 20/40]
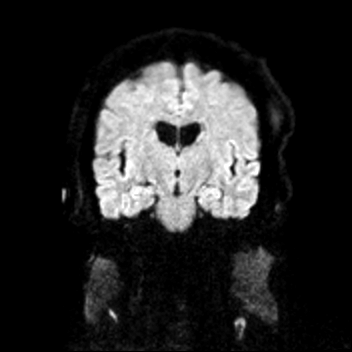
[im 40/40]
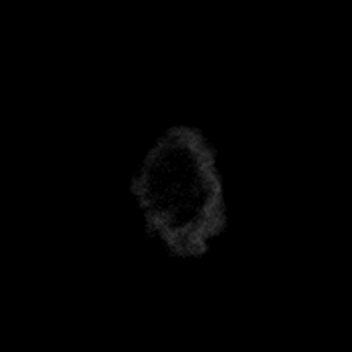

[Series 8: cor dwi_adc · coronal · 5.0mm · 0.65mm/px · 3 of 36 slices shown]
[im 1/36]
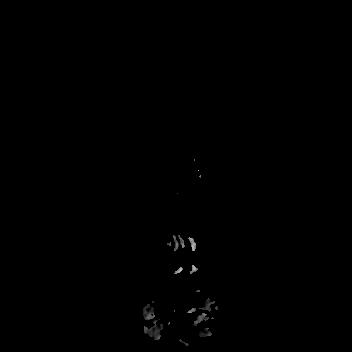
[im 18/36]
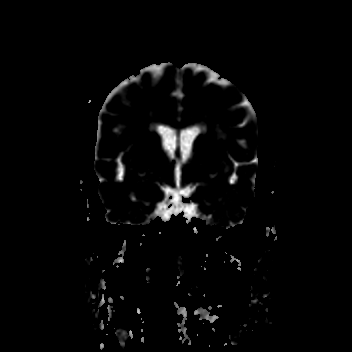
[im 36/36]
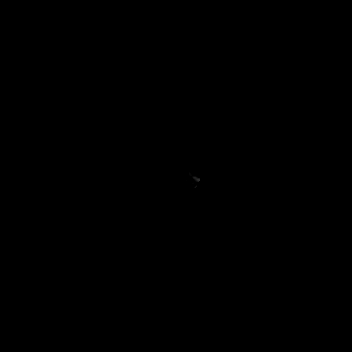

[Series 9: T1 · sagittal · 5.0mm · 0.62mm/px · 2 of 21 slices shown (1 of 2)]
[im 1/21]
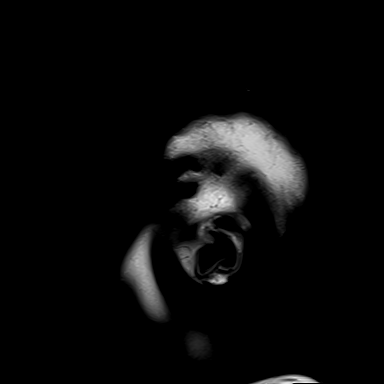
[im 21/21]
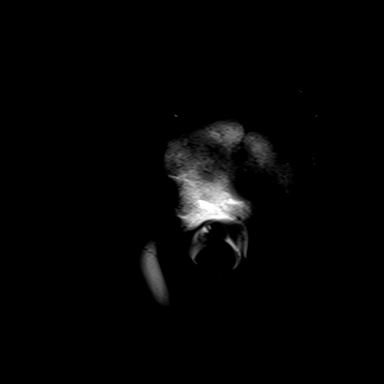

[Series 10: T2 · axial · 5.0mm · 0.53mm/px · z∈[-34,+102]mm · 2 of 25 slices shown]
[im 1/25]
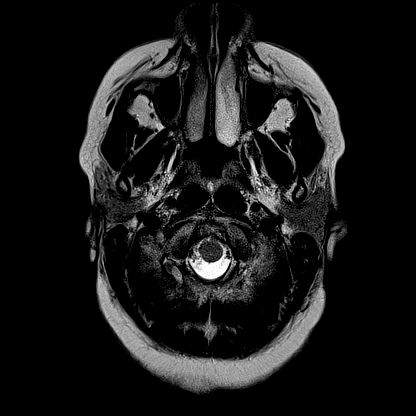
[im 25/25]
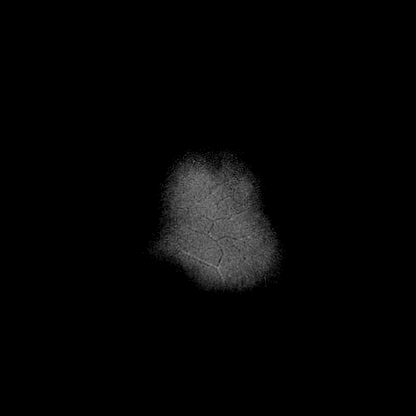

[Series 11: mag_images · axial · 3.0mm · 0.90mm/px · z∈[-52,+116]mm · 4 of 60 slices shown]
[im 1/60]
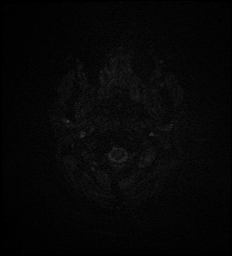
[im 20/60]
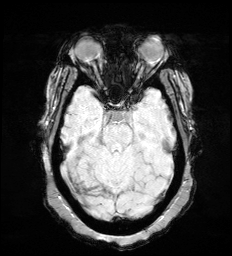
[im 40/60]
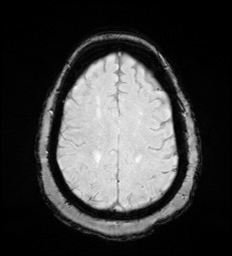
[im 60/60]
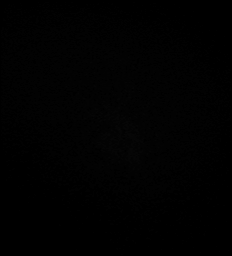

[Series 12: pha_images · axial · 3.0mm · 0.90mm/px · z∈[-52,+110]mm · 4 of 58 slices shown]
[im 1/58]
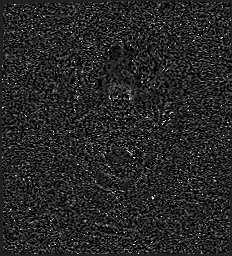
[im 20/58]
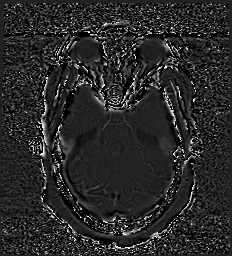
[im 39/58]
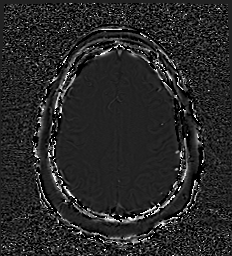
[im 58/58]
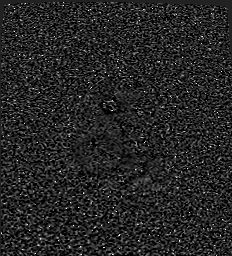

[Series 13: swi_images · axial · 3.0mm · 0.90mm/px · z∈[-52,+116]mm · 4 of 60 slices shown]
[im 1/60]
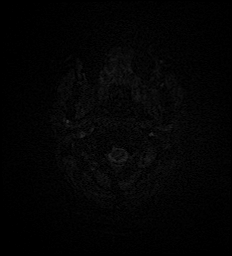
[im 20/60]
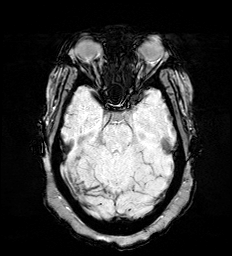
[im 40/60]
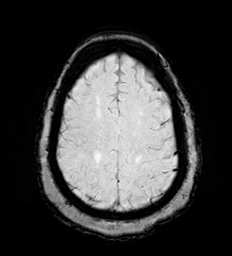
[im 60/60]
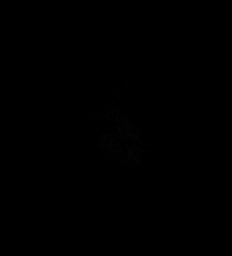

[Series 15: FLAIR · axial · 3.0mm · 0.53mm/px · z∈[-42,+111]mm · 4 of 55 slices shown (1 of 2)]
[im 1/55]
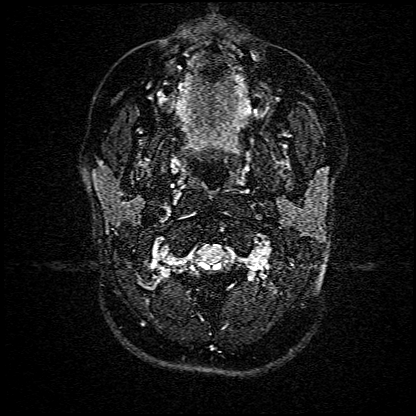
[im 19/55]
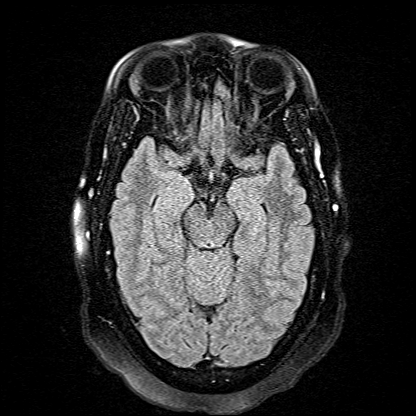
[im 37/55]
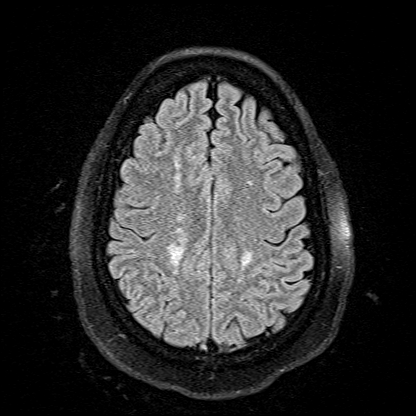
[im 55/55]
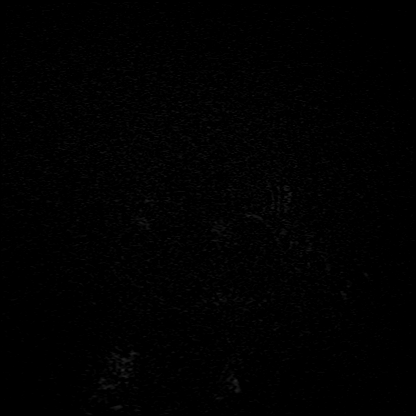

[Series 16: T1 · axial · 1.0mm · 0.98mm/px · z∈[-52,+113]mm · 13 of 176 slices shown (2 of 2)]
[im 1/176]
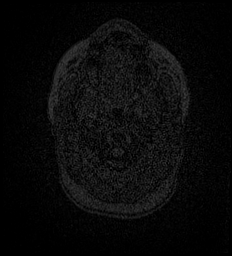
[im 15/176]
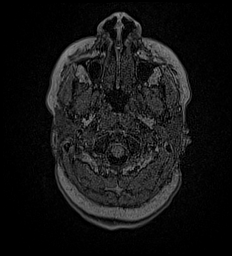
[im 30/176]
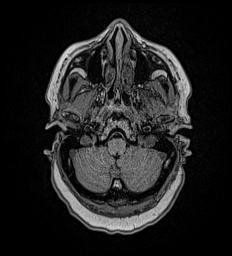
[im 44/176]
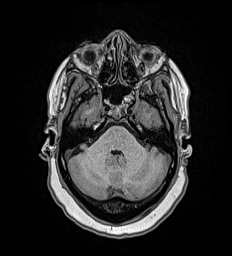
[im 59/176]
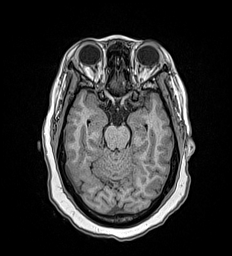
[im 73/176]
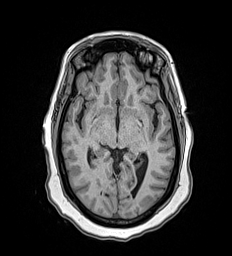
[im 88/176]
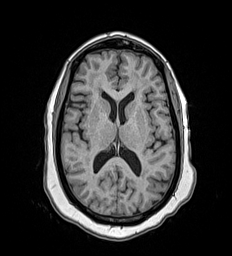
[im 103/176]
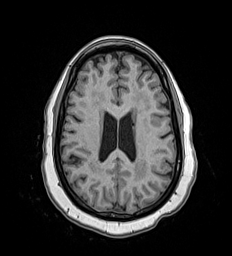
[im 117/176]
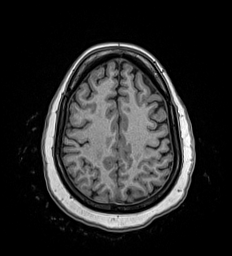
[im 132/176]
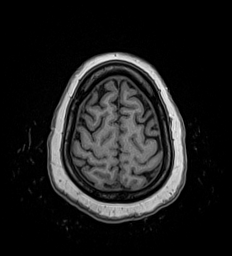
[im 146/176]
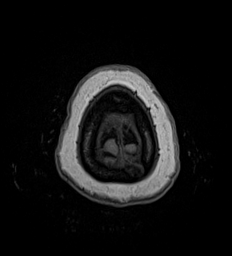
[im 161/176]
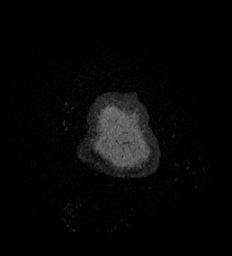
[im 176/176]
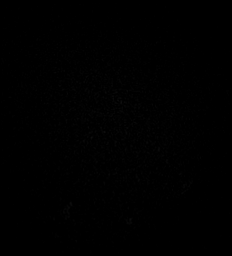

[Series 17: FLAIR · sagittal · 5.0mm · 0.94mm/px · 2 of 21 slices shown (2 of 2)]
[im 1/21]
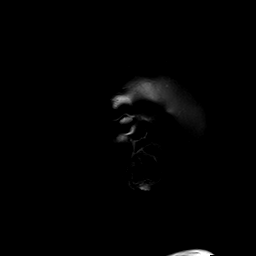
[im 21/21]
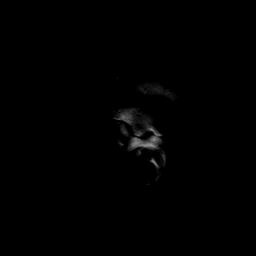

[48 of 48 positions shown; findings below may reference images not displayed]

FINDINGS: Brain: No restricted diffusion to suggest acute or subacute infarct.
No acute hemorrhage, mass, mass effect, or midline shift. No
hemosiderin deposition to suggest remote hemorrhage. Redemonstrated
lacunar infarct in the left caudate.

Redemonstrated patchy T2 hyperintense signal throughout the
periventricular and juxtacortical white matter which appears similar
to the prior MRI. No new lesions.

Vascular: Normal flow voids.

Skull and upper cervical spine: Normal marrow signal.

Sinuses/Orbits: No acute finding.

Other: The mastoids are well aerated.
IMPRESSION: 1. Redemonstrated T2 hyperintense foci in the periventricular and
juxtacortical white matter, consistent with the patient's history of
multiple sclerosis. No new lesions are seen.
2.  No acute intracranial process.

## 2023-12-08 IMAGING — MR MR CERVICAL SPINE W/O CM
5 series · 39 of 48 positions shown · non-contrast
Comparison: 11/23/2017

CLINICAL DATA: Multiple sclerosis, facial numbness and bilateral
hand tingling, neck pain

EXAM:
MRI CERVICAL SPINE WITHOUT CONTRAST
TECHNIQUE: Multiplanar, multisequence MR imaging of the cervical spine was
performed. No intravenous contrast was administered.

[Series 5: T2 · sagittal · 3.0mm · 0.62mm/px · 6 of 15 slices shown (1 of 2)]
[im 1/15]
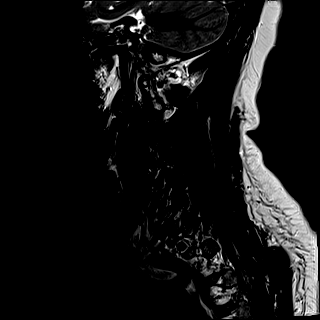
[im 3/15]
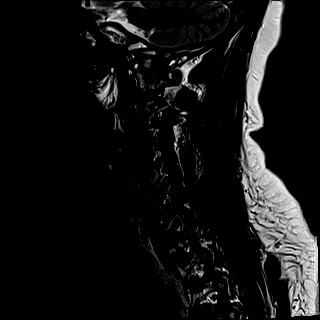
[im 6/15]
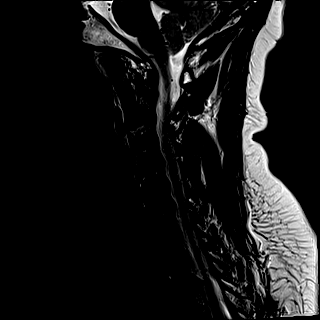
[im 9/15]
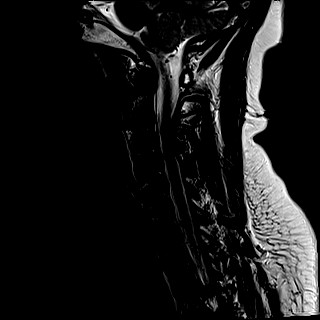
[im 12/15]
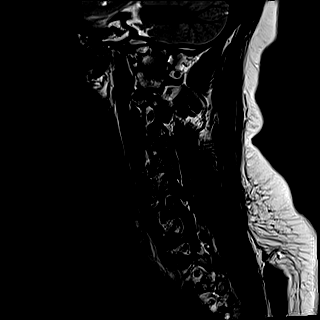
[im 15/15]
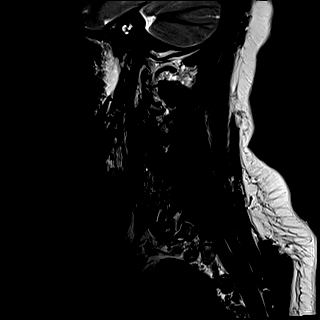

[Series 6: FLAIR · sagittal · 3.0mm · 0.78mm/px · 7 of 15 slices shown]
[im 1/15]
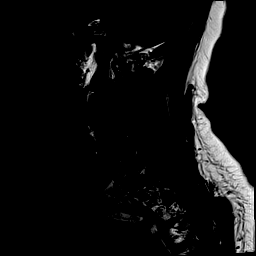
[im 3/15]
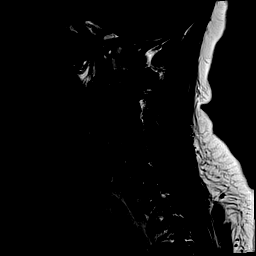
[im 5/15]
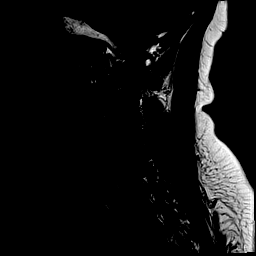
[im 8/15]
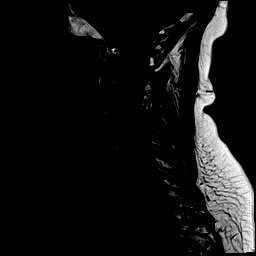
[im 10/15]
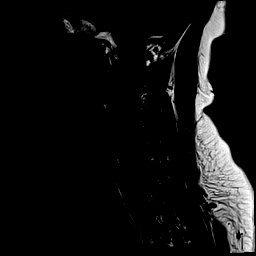
[im 12/15]
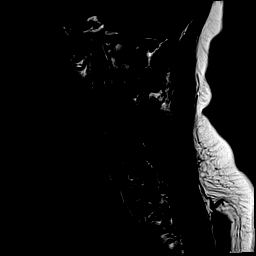
[im 15/15]
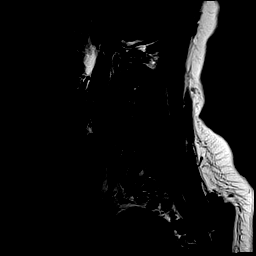

[Series 7: STIR · sagittal · 3.0mm · 0.62mm/px · 7 of 15 slices shown]
[im 1/15]
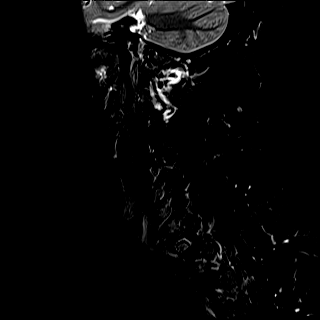
[im 3/15]
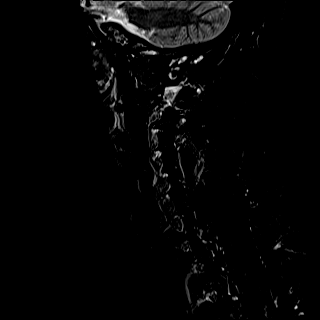
[im 5/15]
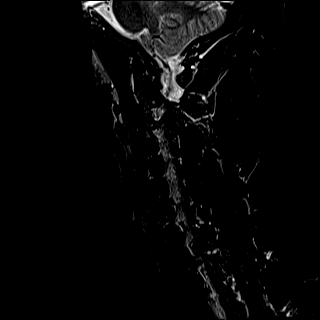
[im 8/15]
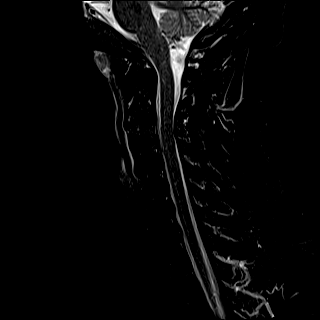
[im 10/15]
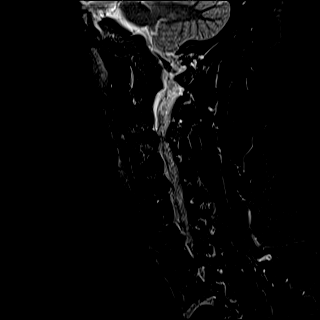
[im 12/15]
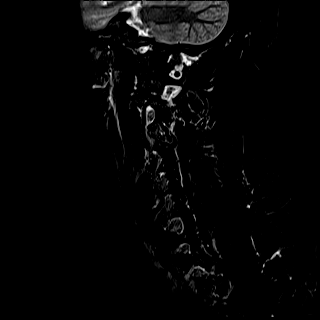
[im 15/15]
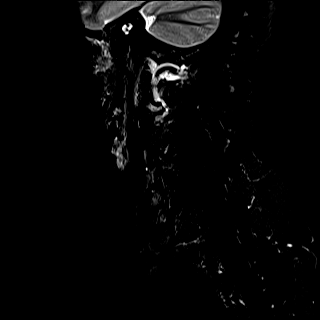

[Series 8: T2 · axial · 3.0mm · 0.70mm/px · z∈[-144,-51]mm · 11 of 29 slices shown (2 of 2)]
[im 1/29]
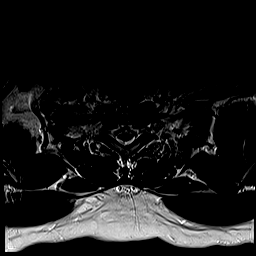
[im 3/29]
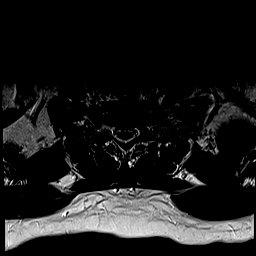
[im 5/29]
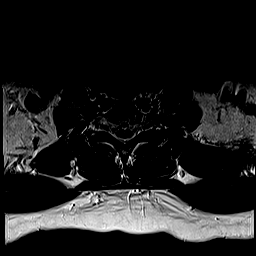
[im 7/29]
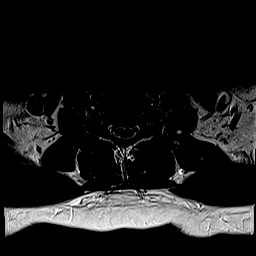
[im 9/29]
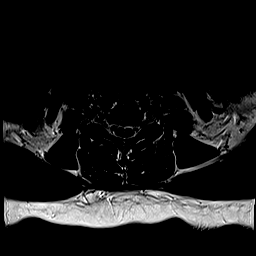
[im 11/29]
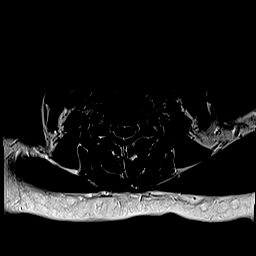
[im 13/29]
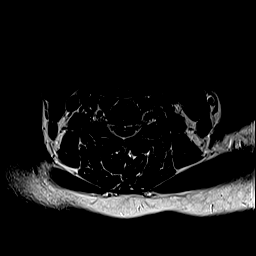
[im 16/29]
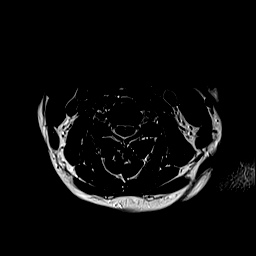
[im 20/29]
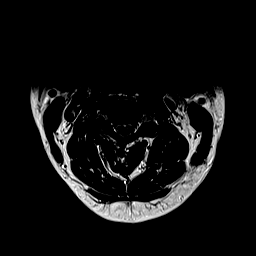
[im 24/29]
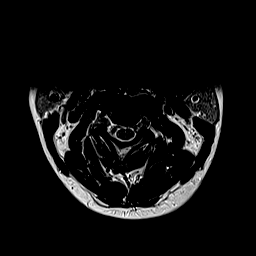
[im 29/29]
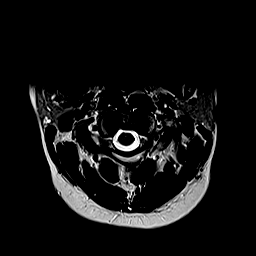

[Series 9: ax mpgr · axial · 3.0mm · 0.35mm/px · z∈[-144,-51]mm · 8 of 29 slices shown]
[im 1/29]
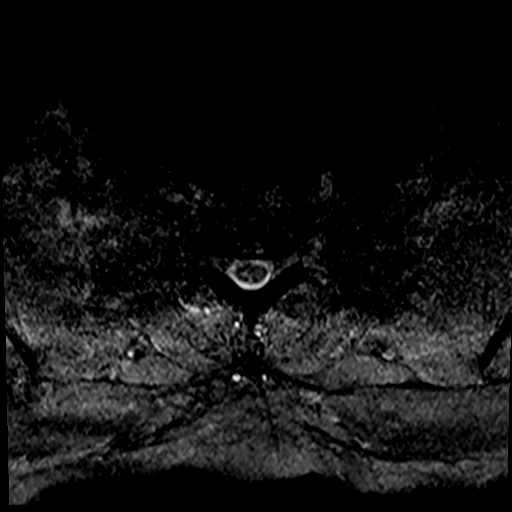
[im 5/29]
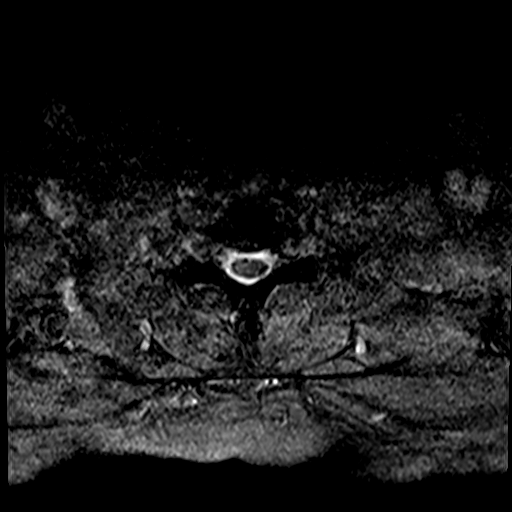
[im 9/29]
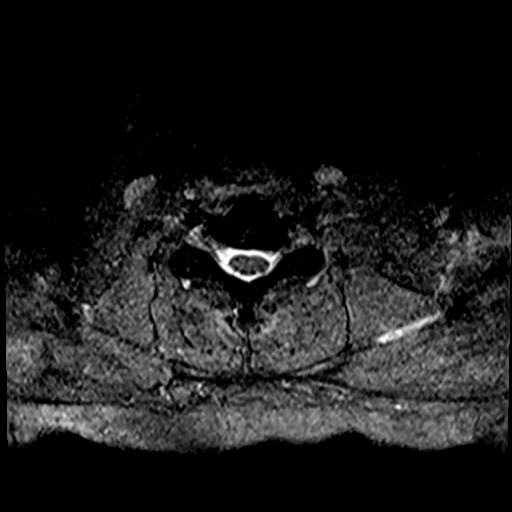
[im 13/29]
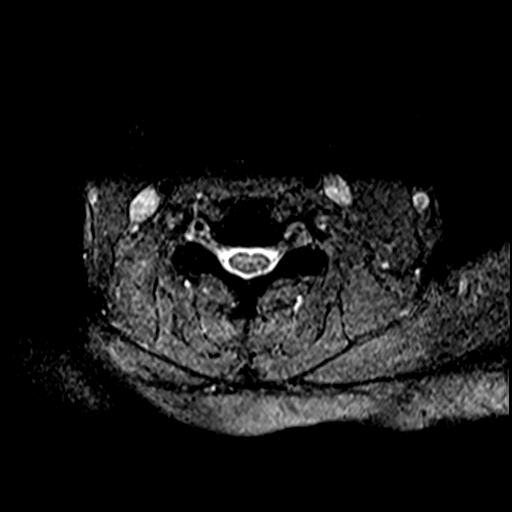
[im 16/29]
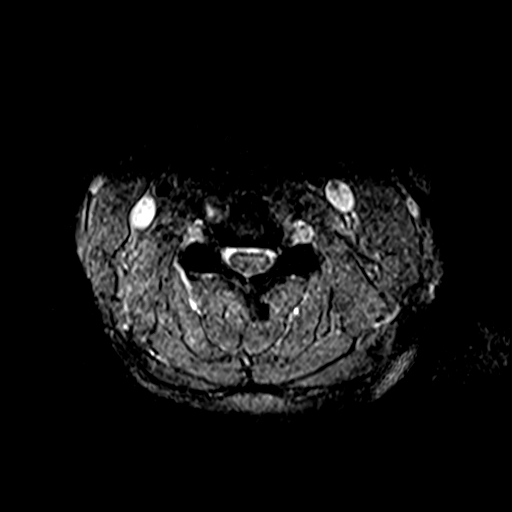
[im 20/29]
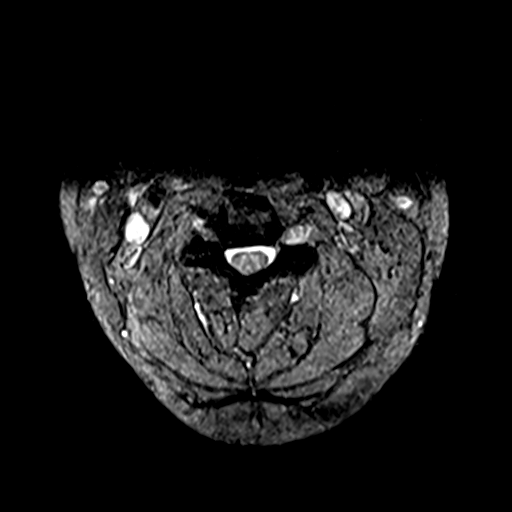
[im 24/29]
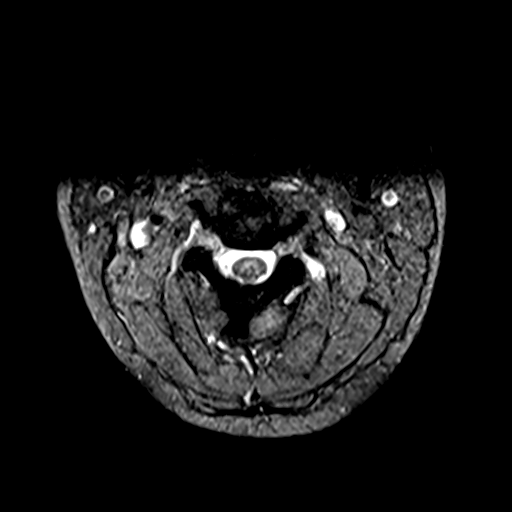
[im 29/29]
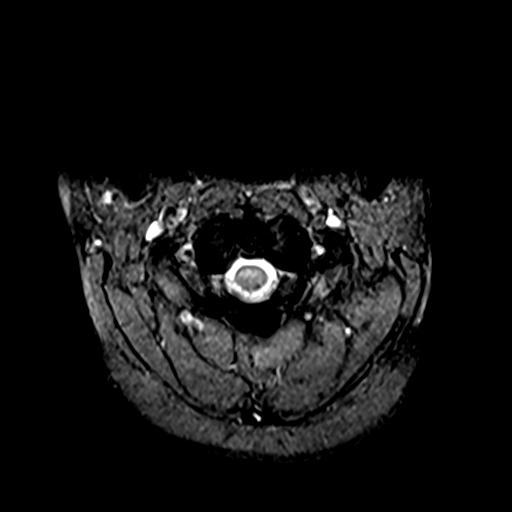

[39 of 48 positions shown; findings below may reference images not displayed]

FINDINGS: Alignment: Straightening of the normal cervical lordosis. No
listhesis.

Vertebrae: No acute fracture or suspicious osseous lesion.

Cord: Normal signal and morphology. No abnormal T2 hyperintense
signal.

Posterior Fossa, vertebral arteries, paraspinal tissues: Negative.

Disc levels:

C2-C3: Mild disc bulge. Facet and uncovertebral hypertrophy. No
spinal canal stenosis or neural foraminal narrowing.

C3-C4: Minimal disc bulge. Right-greater-than-left facet and
uncovertebral hypertrophy. Mild spinal canal stenosis and moderate
to severe right neural foraminal narrowing, which have progressed
from the prior exam.

C4-C5: Minimal disc bulge. Uncovertebral and facet arthropathy no
spinal canal stenosis or neural foraminal narrowing.

C5-C6: Minimal disc bulge. No spinal canal stenosis or neural
foraminal narrowing.

C6-C7: Minimal disc bulge. Left uncovertebral hypertrophy.
Borderline spinal canal stenosis and mild left neural foraminal
narrowing, unchanged. No spinal canal

C7-T1: Minimal disc bulge. No spinal canal stenosis or
neuroforaminal narrowing.
IMPRESSION: 1. Normal appearance of the cervical spinal cord.
2. C3-C4 mild spinal canal stenosis and moderate to severe right
neural foraminal narrowing, which have progressed from the prior
exam.
3. C6-C7 borderline spinal canal stenosis and mild left neural
foraminal narrowing, unchanged.

## 2023-12-14 ENCOUNTER — Ambulatory Visit: Admitting: Internal Medicine

## 2023-12-21 ENCOUNTER — Ambulatory Visit: Admitting: Internal Medicine

## 2024-01-11 ENCOUNTER — Encounter: Payer: Self-pay | Admitting: Internal Medicine

## 2024-01-11 ENCOUNTER — Ambulatory Visit (INDEPENDENT_AMBULATORY_CARE_PROVIDER_SITE_OTHER): Admitting: Internal Medicine

## 2024-01-11 VITALS — BP 126/78 | HR 71 | Ht 63.0 in | Wt 194.2 lb

## 2024-01-11 DIAGNOSIS — K581 Irritable bowel syndrome with constipation: Secondary | ICD-10-CM | POA: Diagnosis not present

## 2024-01-11 DIAGNOSIS — R7303 Prediabetes: Secondary | ICD-10-CM | POA: Diagnosis not present

## 2024-01-11 DIAGNOSIS — E66811 Obesity, class 1: Secondary | ICD-10-CM

## 2024-01-11 DIAGNOSIS — R21 Rash and other nonspecific skin eruption: Secondary | ICD-10-CM

## 2024-01-11 DIAGNOSIS — E782 Mixed hyperlipidemia: Secondary | ICD-10-CM | POA: Diagnosis not present

## 2024-01-11 DIAGNOSIS — I10 Essential (primary) hypertension: Secondary | ICD-10-CM

## 2024-01-11 MED ORDER — WEGOVY 0.5 MG/0.5ML ~~LOC~~ SOAJ: 0.5000 mg | SUBCUTANEOUS | 0 refills | Status: DC

## 2024-01-11 NOTE — Progress Notes (Signed)
 Established Patient Office Visit  Subjective:  Patient ID: Jill English, female    DOB: 09-06-1965  Age: 58 y.o. MRN: [id number]  Chief Complaint  Patient presents with   Follow-up    1 month follow up    Patient is here for her follow-up today.  She is generally feeling well.  Concerned about her weight gain as she was not able to get Ozempic  approved by her insurance company.  Would like to switch to Wegovy . Denies nausea or vomiting, no abdominal pain, no fevers or chills. Mention an itchy rash on her scalp, requests dermatology referral.  Patient advised to start using Nizoral shampoo meanwhile.    No other concerns at this time.   Past Medical History:  Diagnosis Date   Anxiety    Anxiety    Depression    Diverticulitis Sept 2011   CT documented   GERD (gastroesophageal reflux disease)    Headache    Heart murmur    seen by cardiologist, normal work up   Hemorrhoids    History of hiatal hernia    Hyperlipidemia    Hypertension    moderate obesity    MS (multiple sclerosis) (HCC)    Narcolepsy    Sleep apnea    uses cpap    Past Surgical History:  Procedure Laterality Date   ABDOMINAL HYSTERECTOMY     BACK SURGERY  2004   BREAST BIOPSY Left 05/29/2022   US  Core BX Coil Clip -  APOCRINE METAPLASIA WITH PAPILLARY FEATURES.   BREAST BIOPSY Left 05/29/2022   US  LT BREAST BX W LOC DEV 1ST LESION IMG BX SPEC US  GUIDE 05/29/2022 [facility]   carpal tunnel repair  2009   COLONOSCOPY  04/07/2011   DOQ:ojmhz internal hemorrhoids/diverticula in the sigmoid colon   ESOPHAGOGASTRODUODENOSCOPY  04/07/2011   SLF: no evidence of h pylori/gastric body and antral mucosa with minimal chronic inflammation, negative H.pylori   HEMORRHOID SURGERY  07/21/2011   Procedure: HEMORRHOIDECTOMY;  Surgeon: Oneil DELENA Budge, MD;  Location: [facility];  Service: General;  Laterality: N/A;   PARTIAL HYSTERECTOMY  2008   with removal of rt ovary   REDUCTION MAMMAPLASTY Bilateral  03/20/2022   TUBAL LIGATION  1992    Social History   Socioeconomic History   Marital status: Divorced    Spouse name: Marcey   Number of children: 2   Years of education: college   Highest education level: Not on file  Occupational History    Employer: NOT EMPLOYED  Tobacco Use   Smoking status: Never   Smokeless tobacco: Never  Substance and Sexual Activity   Alcohol use: No   Drug use: No   Sexual activity: Yes  Other Topics Concern   Not on file  Social History Narrative   Patient is married Braden) and lives with her husband.   Patient has two children.   Patient has a college education.         Social Drivers of Corporate investment banker Strain: Not on file  Food Insecurity: No Food Insecurity (05/25/2023)   Hunger Vital Sign    Worried About Running Out of Food in the Last Year: Never true    Ran Out of Food in the Last Year: Never true  Transportation Needs: No Transportation Needs (05/25/2023)   PRAPARE - Administrator, Civil Service (Medical): No    Lack of Transportation (Non-Medical): No  Physical Activity: Not on file  Stress: No Stress Concern  Present (05/25/2023)   [name]    Feeling of Stress : Only a little  Social Connections: Unknown (10/06/2021)   Received from [facility]   Social Network    Social Network: Not on file  Intimate Partner Violence: Not At Risk (05/25/2023)   Humiliation, Afraid, Rape, and Kick questionnaire    Fear of Current or Ex-Partner: No    Emotionally Abused: No    Physically Abused: No    Sexually Abused: No    Family History  Problem Relation Age of Onset   Heart disease Father    Diabetes Father    Cancer Father        prostate   Multiple sclerosis Sister    Diabetes Sister    Hyperlipidemia Sister    Cancer Sister    Cancer Mother        lung   Hypertension Mother    Hyperlipidemia Brother    Other Brother        rare  blood disorder   Breast cancer Paternal Aunt    Colon cancer Neg Hx    Anesthesia problems Neg Hx    Hypotension Neg Hx    Malignant hyperthermia Neg Hx    Pseudochol deficiency Neg Hx     Allergies  Allergen Reactions   Contrast Media [Iodinated Contrast Media] Swelling    Facial swelling   Gadolinium Derivatives Itching and Rash    Patient had received contrast before without any adverse effects. About 15 minutes after injection patient began to itch and showed signs of facial rash/swelling for several minutes. Radiologist examined and cleared patient, but suggested premedication for any future contrast injections.    Latex Rash    Outpatient Medications Prior to Visit  Medication Sig   acetaminophen  (TYLENOL ) 500 MG tablet Take 1 tablet (500 mg total) by mouth every 6 (six) hours as needed. For use AFTER surgery   Bempedoic Acid-Ezetimibe (NEXLIZET ) 180-10 MG TABS Take 1 tablet by mouth daily.   cyclobenzaprine  (FLEXERIL ) 10 MG tablet Take 1 tablet (10 mg total) by mouth 3 (three) times daily as needed for muscle spasms.   desonide (DESOWEN) 0.05 % lotion Apply 1 application topically Twice daily as needed. Apply to scalp if a flare-up of alopecia   fexofenadine (ALLEGRA) 180 MG tablet Take 180 mg by mouth daily.   fluticasone (FLONASE) 50 MCG/ACT nasal spray Place 2 sprays into the nose daily as needed. Sinuses   linaclotide  (LINZESS ) 145 MCG CAPS capsule Take 1 capsule (145 mcg total) by mouth daily before breakfast.   meloxicam  (MOBIC ) 7.5 MG tablet Take 1 tablet (7.5 mg total) by mouth daily.   Multiple Vitamin (MULTIVITAMIN) capsule Take 1 capsule by mouth daily.   pantoprazole  (PROTONIX ) 40 MG tablet Take 1 tablet (40 mg total) by mouth daily. 30 minutes before first meal of day.   potassium chloride  (K-DUR) 10 MEQ tablet Take 1 tablet (10 mEq total) by mouth 2 (two) times daily.   valACYclovir (VALTREX) 500 MG tablet TAKE 1 TABLET BY MOUTH TWICE DAILY FOR 3 DAYS FOR OUTBREAK    valsartan-hydrochlorothiazide (DIOVAN-HCT) 160-25 MG tablet TAKE 1 TABLET BY MOUTH EVERY DAY   Vitamin D, Ergocalciferol, (DRISDOL) 1.25 MG (50000 UNIT) CAPS capsule TAKE 1 CAPSULE BY MOUTH 1 TIME A WEEK   ELDERBERRY PO Take by mouth. (Patient not taking: Reported on 01/11/2024)   ondansetron  (ZOFRAN ) 4 MG tablet Take 1 tablet (4 mg total) by mouth every 8 (  eight) hours as needed for nausea or vomiting. (Patient not taking: Reported on 01/11/2024)   sucralfate  (CARAFATE ) 1 g tablet Take 1 tablet (1 g total) by mouth 2 (two) times daily. (Patient not taking: Reported on 01/11/2024)   SUMAtriptan (IMITREX) 100 MG tablet Take 100 mg by mouth once. May repeat in 2 hours if headache persists or recurs. (Patient not taking: Reported on 01/11/2024)   [DISCONTINUED] Semaglutide , 1 MG/DOSE, 4 MG/3ML SOPN Inject 1 mg into the skin once a week. (Patient not taking: Reported on 01/11/2024)   No facility-administered medications prior to visit.    Review of Systems  Constitutional: Negative.  Negative for chills, diaphoresis, fever and malaise/fatigue.  HENT: Negative.  Negative for sore throat.   Eyes: Negative.   Respiratory: Negative.  Negative for cough and shortness of breath.   Cardiovascular: Negative.  Negative for chest pain, palpitations and leg swelling.  Gastrointestinal: Negative.  Negative for abdominal pain, constipation, diarrhea, heartburn, nausea and vomiting.  Genitourinary: Negative.  Negative for dysuria and flank pain.  Musculoskeletal: Negative.  Negative for joint pain and myalgias.  Skin:  Negative for rash (itchy on scalp).  Neurological: Negative.  Negative for dizziness, tingling, tremors and headaches.  Endo/Heme/Allergies: Negative.   Psychiatric/Behavioral: Negative.  Negative for depression and suicidal ideas. The patient is not nervous/anxious.        Objective:   BP 126/78   Pulse 71   Ht 5' 3 (1.6 m)   Wt 194 lb 3.2 oz (88.1 kg)   SpO2 98%   BMI 34.40 kg/m    Vitals:   01/11/24 1110  BP: 126/78  Pulse: 71  Height: 5' 3 (1.6 m)  Weight: 194 lb 3.2 oz (88.1 kg)  SpO2: 98%  BMI (Calculated): 34.41    Physical Exam Vitals and nursing note reviewed.  Constitutional:      Appearance: Normal appearance.  HENT:     Head: Normocephalic and atraumatic.     Nose: Nose normal.     Mouth/Throat:     Mouth: Mucous membranes are moist.     Pharynx: Oropharynx is clear.  Eyes:     Conjunctiva/sclera: Conjunctivae normal.     Pupils: Pupils are equal, round, and reactive to light.  Cardiovascular:     Rate and Rhythm: Normal rate and regular rhythm.     Pulses: Normal pulses.     Heart sounds: Normal heart sounds. No murmur heard. Pulmonary:     Effort: Pulmonary effort is normal.     Breath sounds: Normal breath sounds. No wheezing.  Abdominal:     General: Bowel sounds are normal.     Palpations: Abdomen is soft.     Tenderness: There is no abdominal tenderness. There is no right CVA tenderness or left CVA tenderness.  Musculoskeletal:        General: Normal range of motion.     Cervical back: Normal range of motion.     Right lower leg: No edema.     Left lower leg: No edema.  Skin:    General: Skin is warm and dry.  Neurological:     General: No focal deficit present.     Mental Status: She is alert and oriented to person, place, and time.  Psychiatric:        Mood and Affect: Mood normal.        Behavior: Behavior normal.      No results found for any visits on 01/11/24.  No results found for this  or any previous visit (from the past 2160 hours).    Assessment & Plan:  Continue all medications.  Switch to Wegovy  injections.  Dermatology referral. Problem List Items Addressed This Visit     Irritable bowel syndrome (IBS)   Obesity (BMI 30.0-34.9)   Relevant Medications   semaglutide -weight management (WEGOVY ) 0.5 MG/0.5ML SOAJ SQ injection   Essential hypertension, benign - Primary   Mixed hyperlipidemia    Prediabetes   Other Visit Diagnoses       Rash       Relevant Orders   Ambulatory referral to Dermatology       Follow up 1 month.  Total time spent: 30 minutes  FERNAND FREDY RAMAN, MD  01/11/2024   This document may have been prepared by [facility] Voice Recognition software and as such may include unintentional dictation errors.

## 2024-02-09 ENCOUNTER — Other Ambulatory Visit: Payer: Self-pay | Admitting: Internal Medicine

## 2024-02-09 DIAGNOSIS — E782 Mixed hyperlipidemia: Secondary | ICD-10-CM

## 2024-02-15 ENCOUNTER — Ambulatory Visit: Admitting: Internal Medicine

## 2024-02-15 DIAGNOSIS — L218 Other seborrheic dermatitis: Secondary | ICD-10-CM | POA: Diagnosis not present

## 2024-02-15 DIAGNOSIS — L6681 Central centrifugal cicatricial alopecia: Secondary | ICD-10-CM | POA: Diagnosis not present

## 2024-02-22 ENCOUNTER — Encounter: Payer: Self-pay | Admitting: Internal Medicine

## 2024-02-22 ENCOUNTER — Ambulatory Visit (INDEPENDENT_AMBULATORY_CARE_PROVIDER_SITE_OTHER): Admitting: Internal Medicine

## 2024-02-22 VITALS — BP 110/76 | HR 106 | Ht 63.0 in | Wt 198.0 lb

## 2024-02-22 DIAGNOSIS — I10 Essential (primary) hypertension: Secondary | ICD-10-CM

## 2024-02-22 DIAGNOSIS — R7303 Prediabetes: Secondary | ICD-10-CM | POA: Diagnosis not present

## 2024-02-22 DIAGNOSIS — E782 Mixed hyperlipidemia: Secondary | ICD-10-CM | POA: Diagnosis not present

## 2024-02-22 DIAGNOSIS — E66811 Obesity, class 1: Secondary | ICD-10-CM | POA: Diagnosis not present

## 2024-02-22 NOTE — Progress Notes (Signed)
 Established Patient Office Visit  Subjective:  Patient ID: Jill English, female    DOB: 11/16/1965  Age: 58 y.o. MRN: [id number]  Chief Complaint  Patient presents with   Follow-up    1 month follow up    Patient comes in for her 1 month follow-up today.  She was prescribed Wegovy  at her last visit but she was unable to get it so far.  As a result her weight has actually gone up.  Also admits that she is not exercising as before but will start now.  Also advised on a strict diet control.   She will return fasting for her blood work    No other concerns at this time.   Past Medical History:  Diagnosis Date   Anxiety    Anxiety    Depression    Diverticulitis Sept 2011   CT documented   GERD (gastroesophageal reflux disease)    Headache    Heart murmur    seen by cardiologist, normal work up   Hemorrhoids    History of hiatal hernia    Hyperlipidemia    Hypertension    moderate obesity    MS (multiple sclerosis)    Narcolepsy    Sleep apnea    uses cpap    Past Surgical History:  Procedure Laterality Date   ABDOMINAL HYSTERECTOMY     BACK SURGERY  2004   BREAST BIOPSY Left 05/29/2022   US  Core BX Coil Clip -  APOCRINE METAPLASIA WITH PAPILLARY FEATURES.   BREAST BIOPSY Left 05/29/2022   US  LT BREAST BX W LOC DEV 1ST LESION IMG BX SPEC US  GUIDE 05/29/2022 [facility]   carpal tunnel repair  2009   COLONOSCOPY  04/07/2011   DOQ:ojmhz internal hemorrhoids/diverticula in the sigmoid colon   ESOPHAGOGASTRODUODENOSCOPY  04/07/2011   SLF: no evidence of h pylori/gastric body and antral mucosa with minimal chronic inflammation, negative H.pylori   HEMORRHOID SURGERY  07/21/2011   Procedure: HEMORRHOIDECTOMY;  Surgeon: Oneil DELENA Budge, MD;  Location: [facility];  Service: General;  Laterality: N/A;   PARTIAL HYSTERECTOMY  2008   with removal of rt ovary   REDUCTION MAMMAPLASTY Bilateral 03/20/2022   TUBAL LIGATION  1992    Social History   Socioeconomic  History   Marital status: Divorced    Spouse name: Marcey   Number of children: 2   Years of education: college   Highest education level: Not on file  Occupational History    Employer: NOT EMPLOYED  Tobacco Use   Smoking status: Never   Smokeless tobacco: Never  Substance and Sexual Activity   Alcohol use: No   Drug use: No   Sexual activity: Yes  Other Topics Concern   Not on file  Social History Narrative   Patient is married Braden) and lives with her husband.   Patient has two children.   Patient has a college education.         Social Drivers of Corporate investment banker Strain: Not on file  Food Insecurity: No Food Insecurity (05/25/2023)   Hunger Vital Sign    Worried About Running Out of Food in the Last Year: Never true    Ran Out of Food in the Last Year: Never true  Transportation Needs: No Transportation Needs (05/25/2023)   PRAPARE - Administrator, Civil Service (Medical): No    Lack of Transportation (Non-Medical): No  Physical Activity: Not on file  Stress: No Stress Concern Present (  05/25/2023)   [employer] - Occupational Stress Questionnaire    Feeling of Stress : Only a little  Social Connections: Unknown (10/06/2021)   Received from [facility]   Social Network    Social Network: Not on file  Intimate Partner Violence: Not At Risk (05/25/2023)   Humiliation, Afraid, Rape, and Kick questionnaire    Fear of Current or Ex-Partner: No    Emotionally Abused: No    Physically Abused: No    Sexually Abused: No    Family History  Problem Relation Age of Onset   Heart disease Father    Diabetes Father    Cancer Father        prostate   Multiple sclerosis Sister    Diabetes Sister    Hyperlipidemia Sister    Cancer Sister    Cancer Mother        lung   Hypertension Mother    Hyperlipidemia Brother    Other Brother        rare blood disorder   Breast cancer Paternal Aunt    Colon cancer Neg Hx     Anesthesia problems Neg Hx    Hypotension Neg Hx    Malignant hyperthermia Neg Hx    Pseudochol deficiency Neg Hx     Allergies  Allergen Reactions   Contrast Media [Iodinated Contrast Media] Swelling    Facial swelling   Gadolinium Derivatives Itching and Rash    Patient had received contrast before without any adverse effects. About 15 minutes after injection patient began to itch and showed signs of facial rash/swelling for several minutes. Radiologist examined and cleared patient, but suggested premedication for any future contrast injections.    Latex Rash    Outpatient Medications Prior to Visit  Medication Sig   acetaminophen  (TYLENOL ) 500 MG tablet Take 1 tablet (500 mg total) by mouth every 6 (six) hours as needed. For use AFTER surgery   cyclobenzaprine  (FLEXERIL ) 10 MG tablet Take 1 tablet (10 mg total) by mouth 3 (three) times daily as needed for muscle spasms.   desonide (DESOWEN) 0.05 % lotion Apply 1 application topically Twice daily as needed. Apply to scalp if a flare-up of alopecia   fexofenadine (ALLEGRA) 180 MG tablet Take 180 mg by mouth daily.   fluticasone (FLONASE) 50 MCG/ACT nasal spray Place 2 sprays into the nose daily as needed. Sinuses   linaclotide  (LINZESS ) 145 MCG CAPS capsule Take 1 capsule (145 mcg total) by mouth daily before breakfast.   meloxicam  (MOBIC ) 7.5 MG tablet Take 1 tablet (7.5 mg total) by mouth daily.   Multiple Vitamin (MULTIVITAMIN) capsule Take 1 capsule by mouth daily.   NEXLIZET  180-10 MG TABS TAKE 1 TABLET BY MOUTH DAILY   pantoprazole  (PROTONIX ) 40 MG tablet Take 1 tablet (40 mg total) by mouth daily. 30 minutes before first meal of day.   potassium chloride  (K-DUR) 10 MEQ tablet Take 1 tablet (10 mEq total) by mouth 2 (two) times daily.   valACYclovir (VALTREX) 500 MG tablet TAKE 1 TABLET BY MOUTH TWICE DAILY FOR 3 DAYS FOR OUTBREAK   valsartan-hydrochlorothiazide (DIOVAN-HCT) 160-25 MG tablet TAKE 1 TABLET BY MOUTH EVERY DAY    Vitamin D, Ergocalciferol, (DRISDOL) 1.25 MG (50000 UNIT) CAPS capsule TAKE 1 CAPSULE BY MOUTH 1 TIME A WEEK   ELDERBERRY PO Take by mouth. (Patient not taking: Reported on 02/22/2024)   ondansetron  (ZOFRAN ) 4 MG tablet Take 1 tablet (4 mg total) by mouth every 8 (eight) hours as  needed for nausea or vomiting. (Patient not taking: Reported on 02/22/2024)   semaglutide -weight management (WEGOVY ) 0.5 MG/0.5ML SOAJ SQ injection Inject 0.5 mg into the skin once a week. (Patient not taking: Reported on 02/22/2024)   sucralfate  (CARAFATE ) 1 g tablet Take 1 tablet (1 g total) by mouth 2 (two) times daily. (Patient not taking: Reported on 02/22/2024)   SUMAtriptan (IMITREX) 100 MG tablet Take 100 mg by mouth once. May repeat in 2 hours if headache persists or recurs. (Patient not taking: Reported on 02/22/2024)   No facility-administered medications prior to visit.    Review of Systems  Constitutional: Negative.  Negative for chills, fever and malaise/fatigue.  HENT: Negative.  Negative for congestion and sore throat.   Eyes: Negative.  Negative for blurred vision and pain.  Respiratory: Negative.  Negative for cough and shortness of breath.   Cardiovascular: Negative.  Negative for chest pain, palpitations and leg swelling.  Gastrointestinal: Negative.  Negative for abdominal pain, blood in stool, constipation, diarrhea, heartburn, melena, nausea and vomiting.  Genitourinary: Negative.  Negative for dysuria, flank pain, frequency and urgency.  Musculoskeletal: Negative.  Negative for joint pain and myalgias.  Skin: Negative.   Neurological: Negative.  Negative for dizziness, tingling, sensory change, weakness and headaches.  Endo/Heme/Allergies: Negative.   Psychiatric/Behavioral: Negative.  Negative for depression and suicidal ideas. The patient is not nervous/anxious.        Objective:   BP 110/76   Pulse (!) 106   Ht 5' 3 (1.6 m)   Wt 198 lb (89.8 kg)   SpO2 97%   BMI 35.07 kg/m    Vitals:   02/22/24 1157  BP: 110/76  Pulse: (!) 106  Height: 5' 3 (1.6 m)  Weight: 198 lb (89.8 kg)  SpO2: 97%  BMI (Calculated): 35.08    Physical Exam Vitals and nursing note reviewed.  Constitutional:      Appearance: Normal appearance.  HENT:     Head: Normocephalic and atraumatic.     Nose: Nose normal.     Mouth/Throat:     Mouth: Mucous membranes are moist.     Pharynx: Oropharynx is clear.  Eyes:     Conjunctiva/sclera: Conjunctivae normal.     Pupils: Pupils are equal, round, and reactive to light.  Cardiovascular:     Rate and Rhythm: Normal rate and regular rhythm.     Pulses: Normal pulses.     Heart sounds: Normal heart sounds. No murmur heard. Pulmonary:     Effort: Pulmonary effort is normal.     Breath sounds: Normal breath sounds. No wheezing.  Abdominal:     General: Bowel sounds are normal.     Palpations: Abdomen is soft.     Tenderness: There is no abdominal tenderness. There is no right CVA tenderness or left CVA tenderness.  Musculoskeletal:        General: Normal range of motion.     Cervical back: Normal range of motion.     Right lower leg: No edema.     Left lower leg: No edema.  Skin:    General: Skin is warm and dry.  Neurological:     General: No focal deficit present.     Mental Status: She is alert and oriented to person, place, and time.  Psychiatric:        Mood and Affect: Mood normal.        Behavior: Behavior normal.      No results found for any visits on 02/22/24.  No results found for this or any previous visit (from the past 2160 hours).    Assessment & Plan:  Continue current medications and a strict diet control.  Monitor weight.  Will try to get GLP-1 approved. Problem List Items Addressed This Visit     Obesity (BMI 30.0-34.9)   Essential hypertension, benign - Primary   Relevant Orders   CMP14+EGFR   Mixed hyperlipidemia   Relevant Orders   Lipid Panel w/o Chol/HDL Ratio   Prediabetes   Relevant  Orders   Hemoglobin A1c    Return in about 2 months (around 04/23/2024).   Total time spent: 30 minutes  FERNAND FREDY RAMAN, MD  02/22/2024   This document may have been prepared by [facility] Voice Recognition software and as such may include unintentional dictation errors.

## 2024-04-25 ENCOUNTER — Ambulatory Visit: Admitting: Internal Medicine

## 2024-04-25 ENCOUNTER — Encounter: Payer: Self-pay | Admitting: Internal Medicine

## 2024-04-25 VITALS — BP 144/98 | HR 75 | Ht 63.0 in | Wt 195.0 lb

## 2024-04-25 DIAGNOSIS — I10 Essential (primary) hypertension: Secondary | ICD-10-CM

## 2024-04-25 DIAGNOSIS — E66811 Obesity, class 1: Secondary | ICD-10-CM

## 2024-04-25 DIAGNOSIS — R7303 Prediabetes: Secondary | ICD-10-CM | POA: Diagnosis not present

## 2024-04-25 DIAGNOSIS — E782 Mixed hyperlipidemia: Secondary | ICD-10-CM

## 2024-04-25 DIAGNOSIS — K581 Irritable bowel syndrome with constipation: Secondary | ICD-10-CM

## 2024-04-25 MED ORDER — WEGOVY 0.5 MG/0.5ML ~~LOC~~ SOAJ: 0.5000 mg | SUBCUTANEOUS | 0 refills | Status: AC

## 2024-04-25 NOTE — Progress Notes (Signed)
 Established Patient Office Visit  Subjective:  Patient ID: Jill English, female    DOB: 12-31-1965  Age: 58 y.o. MRN: [id number]  Chief Complaint  Patient presents with   Follow-up    2 month follow up    Patient comes in for her follow-up today.  She was not able to get Wegovy , but is trying to control her diet and get some physical exercise to help her lose weight.  She still has not gotten her blood work done but will get it done today.  Her blood pressure is high but she says she just took her medication. Denies nausea or vomiting, no chest pain and no palpitations. Will try sending in Wegovy  again and see if it gets approved.    No other concerns at this time.   Past Medical History:  Diagnosis Date   Anxiety    Anxiety    Depression    Diverticulitis Sept 2011   CT documented   GERD (gastroesophageal reflux disease)    Headache    Heart murmur    seen by cardiologist, normal work up   Hemorrhoids    History of hiatal hernia    Hyperlipidemia    Hypertension    moderate obesity    MS (multiple sclerosis)    Narcolepsy    Sleep apnea    uses cpap    Past Surgical History:  Procedure Laterality Date   ABDOMINAL HYSTERECTOMY     BACK SURGERY  2004   BREAST BIOPSY Left 05/29/2022   US  Core BX Coil Clip -  APOCRINE METAPLASIA WITH PAPILLARY FEATURES.   BREAST BIOPSY Left 05/29/2022   US  LT BREAST BX W LOC DEV 1ST LESION IMG BX SPEC US  GUIDE 05/29/2022 [facility]   carpal tunnel repair  2009   COLONOSCOPY  04/07/2011   DOQ:ojmhz internal hemorrhoids/diverticula in the sigmoid colon   ESOPHAGOGASTRODUODENOSCOPY  04/07/2011   SLF: no evidence of h pylori/gastric body and antral mucosa with minimal chronic inflammation, negative H.pylori   HEMORRHOID SURGERY  07/21/2011   Procedure: HEMORRHOIDECTOMY;  Surgeon: Oneil DELENA Budge, MD;  Location: [facility];  Service: General;  Laterality: N/A;   PARTIAL HYSTERECTOMY  2008   with removal of rt ovary    REDUCTION MAMMAPLASTY Bilateral 03/20/2022   TUBAL LIGATION  1992    Social History   Socioeconomic History   Marital status: Divorced    Spouse name: Marcey   Number of children: 2   Years of education: college   Highest education level: Not on file  Occupational History    Employer: NOT EMPLOYED  Tobacco Use   Smoking status: Never   Smokeless tobacco: Never  Substance and Sexual Activity   Alcohol use: No   Drug use: No   Sexual activity: Yes  Other Topics Concern   Not on file  Social History Narrative   Patient is married Braden) and lives with her husband.   Patient has two children.   Patient has a college education.         Social Drivers of Corporate Investment Banker Strain: Not on file  Food Insecurity: No Food Insecurity (05/25/2023)   Hunger Vital Sign    Worried About Running Out of Food in the Last Year: Never true    Ran Out of Food in the Last Year: Never true  Transportation Needs: No Transportation Needs (05/25/2023)   PRAPARE - Transportation    Lack of Transportation (Medical): No    Lack of  Transportation (Non-Medical): No  Physical Activity: Not on file  Stress: No Stress Concern Present (05/25/2023)   [name]    Feeling of Stress : Only a little  Social Connections: Unknown (10/06/2021)   [facility] from [facility]   Social Network    Social Network: Not on file  Intimate Partner Violence: Not At Risk (05/25/2023)   Humiliation, Afraid, Rape, and Kick questionnaire    Fear of Current or Ex-Partner: No    Emotionally Abused: No    Physically Abused: No    Sexually Abused: No    Family History  Problem Relation Age of Onset   Heart disease Father    Diabetes Father    Cancer Father        prostate   Multiple sclerosis Sister    Diabetes Sister    Hyperlipidemia Sister    Cancer Sister    Cancer Mother        lung   Hypertension Mother    Hyperlipidemia  Brother    Other Brother        rare blood disorder   Breast cancer Paternal Aunt    Colon cancer Neg Hx    Anesthesia problems Neg Hx    Hypotension Neg Hx    Malignant hyperthermia Neg Hx    Pseudochol deficiency Neg Hx     Allergies  Allergen Reactions   Contrast Media [Iodinated Contrast Media] Swelling    Facial swelling   Gadolinium Derivatives Itching and Rash    Patient had received contrast before without any adverse effects. About 15 minutes after injection patient began to itch and showed signs of facial rash/swelling for several minutes. Radiologist examined and cleared patient, but suggested premedication for any future contrast injections.    Latex Rash    Outpatient Medications Prior to Visit  Medication Sig   acetaminophen  (TYLENOL ) 500 MG tablet Take 1 tablet (500 mg total) by mouth every 6 (six) hours as needed. For use AFTER surgery   cyclobenzaprine  (FLEXERIL ) 10 MG tablet Take 1 tablet (10 mg total) by mouth 3 (three) times daily as needed for muscle spasms.   desonide (DESOWEN) 0.05 % lotion Apply 1 application topically Twice daily as needed. Apply to scalp if a flare-up of alopecia   fexofenadine (ALLEGRA) 180 MG tablet Take 180 mg by mouth daily.   fluticasone (FLONASE) 50 MCG/ACT nasal spray Place 2 sprays into the nose daily as needed. Sinuses   linaclotide  (LINZESS ) 145 MCG CAPS capsule Take 1 capsule (145 mcg total) by mouth daily before breakfast.   meloxicam  (MOBIC ) 7.5 MG tablet Take 1 tablet (7.5 mg total) by mouth daily.   Multiple Vitamin (MULTIVITAMIN) capsule Take 1 capsule by mouth daily.   NEXLIZET  180-10 MG TABS TAKE 1 TABLET BY MOUTH DAILY   pantoprazole  (PROTONIX ) 40 MG tablet Take 1 tablet (40 mg total) by mouth daily. 30 minutes before first meal of day.   potassium chloride  (K-DUR) 10 MEQ tablet Take 1 tablet (10 mEq total) by mouth 2 (two) times daily.   valACYclovir (VALTREX) 500 MG tablet TAKE 1 TABLET BY MOUTH TWICE DAILY FOR 3 DAYS  FOR OUTBREAK   valsartan-hydrochlorothiazide (DIOVAN-HCT) 160-25 MG tablet TAKE 1 TABLET BY MOUTH EVERY DAY   Vitamin D, Ergocalciferol, (DRISDOL) 1.25 MG (50000 UNIT) CAPS capsule TAKE 1 CAPSULE BY MOUTH 1 TIME A WEEK   sucralfate  (CARAFATE ) 1 g tablet Take 1 tablet (1 g total) by mouth 2 (two) times  daily. (Patient not taking: Reported on 04/25/2024)   SUMAtriptan (IMITREX) 100 MG tablet Take 100 mg by mouth once. May repeat in 2 hours if headache persists or recurs. (Patient not taking: Reported on 04/25/2024)   [DISCONTINUED] ELDERBERRY PO Take by mouth. (Patient not taking: Reported on 04/25/2024)   [DISCONTINUED] ondansetron  (ZOFRAN ) 4 MG tablet Take 1 tablet (4 mg total) by mouth every 8 (eight) hours as needed for nausea or vomiting. (Patient not taking: Reported on 04/25/2024)   [DISCONTINUED] semaglutide -weight management (WEGOVY ) 0.5 MG/0.5ML SOAJ SQ injection Inject 0.5 mg into the skin once a week. (Patient not taking: Reported on 04/25/2024)   No facility-administered medications prior to visit.    Review of Systems  Constitutional: Negative.  Negative for chills, fever and malaise/fatigue.  HENT: Negative.  Negative for congestion and sore throat.   Eyes: Negative.  Negative for blurred vision and pain.  Respiratory: Negative.  Negative for cough and shortness of breath.   Cardiovascular: Negative.  Negative for chest pain, palpitations and leg swelling.  Gastrointestinal: Negative.  Negative for abdominal pain, blood in stool, constipation, diarrhea, heartburn, melena, nausea and vomiting.  Genitourinary: Negative.  Negative for dysuria, flank pain, frequency and urgency.  Musculoskeletal: Negative.  Negative for joint pain and myalgias.  Skin: Negative.   Neurological: Negative.  Negative for dizziness, tingling, sensory change, weakness and headaches.  Endo/Heme/Allergies: Negative.   Psychiatric/Behavioral: Negative.  Negative for depression and suicidal ideas. The patient is  not nervous/anxious.        Objective:   BP (!) 144/98   Pulse 75   Ht 5' 3 (1.6 m)   Wt 195 lb (88.5 kg)   SpO2 97%   BMI 34.54 kg/m   Vitals:   04/25/24 1118  BP: (!) 144/98  Pulse: 75  Height: 5' 3 (1.6 m)  Weight: 195 lb (88.5 kg)  SpO2: 97%  BMI (Calculated): 34.55    Physical Exam Vitals and nursing note reviewed.  Constitutional:      Appearance: Normal appearance.  HENT:     Head: Normocephalic and atraumatic.     Nose: Nose normal.     Mouth/Throat:     Mouth: Mucous membranes are moist.     Pharynx: Oropharynx is clear.  Eyes:     Conjunctiva/sclera: Conjunctivae normal.     Pupils: Pupils are equal, round, and reactive to light.  Cardiovascular:     Rate and Rhythm: Normal rate and regular rhythm.     Pulses: Normal pulses.     Heart sounds: Normal heart sounds. No murmur heard. Pulmonary:     Effort: Pulmonary effort is normal.     Breath sounds: Normal breath sounds. No wheezing.  Abdominal:     General: Bowel sounds are normal.     Palpations: Abdomen is soft.     Tenderness: There is no abdominal tenderness. There is no right CVA tenderness or left CVA tenderness.  Musculoskeletal:        General: Normal range of motion.     Cervical back: Normal range of motion.     Right lower leg: No edema.     Left lower leg: No edema.  Skin:    General: Skin is warm and dry.  Neurological:     General: No focal deficit present.     Mental Status: She is alert and oriented to person, place, and time.  Psychiatric:        Mood and Affect: Mood normal.  Behavior: Behavior normal.      No results found for any visits on 04/25/24.  No results found for this or any previous visit (from the past 2160 hours).    Assessment & Plan:  Continue current meds. Check labs today. Monitor BP at home. Problem List Items Addressed This Visit     Irritable bowel syndrome (IBS)   Obesity (BMI 30.0-34.9)   Relevant Medications   semaglutide -weight  management (WEGOVY ) 0.5 MG/0.5ML SOAJ SQ injection   Essential hypertension, benign - Primary   Mixed hyperlipidemia   Prediabetes    Return in about 3 months (around 07/24/2024).   Total time spent: 30 minutes. This time includes review of previous notes and results and patient face to face interaction during today's visit.    FERNAND FREDY RAMAN, MD  04/25/2024   This document may have been prepared by [facility] Voice Recognition software and as such may include unintentional dictation errors.

## 2024-04-26 ENCOUNTER — Ambulatory Visit: Payer: Self-pay | Admitting: Internal Medicine

## 2024-04-26 LAB — CMP14+EGFR
ALT: 16 IU/L (ref 0–32)
AST: 19 IU/L (ref 0–40)
Albumin: 4.5 g/dL (ref 3.8–4.9)
Alkaline Phosphatase: 71 IU/L (ref 49–135)
BUN/Creatinine Ratio: 17 (ref 9–23)
BUN: 16 mg/dL (ref 6–24)
Bilirubin Total: 0.2 mg/dL (ref 0.0–1.2)
CO2: 25 mmol/L (ref 20–29)
Calcium: 9.7 mg/dL (ref 8.7–10.2)
Chloride: 102 mmol/L (ref 96–106)
Creatinine, Ser: 0.95 mg/dL (ref 0.57–1.00)
Globulin, Total: 2.6 g/dL (ref 1.5–4.5)
Glucose: 90 mg/dL (ref 70–99)
Potassium: 4.1 mmol/L (ref 3.5–5.2)
Sodium: 140 mmol/L (ref 134–144)
Total Protein: 7.1 g/dL (ref 6.0–8.5)
eGFR: 70 mL/min/1.73 (ref >59)

## 2024-04-26 LAB — LIPID PANEL W/O CHOL/HDL RATIO
Cholesterol, Total: 220 mg/dL — ABNORMAL HIGH (ref 100–199)
HDL: 59 mg/dL (ref >39)
LDL Chol Calc (NIH): 134 mg/dL — ABNORMAL HIGH (ref 0–99)
Triglycerides: 150 mg/dL — ABNORMAL HIGH (ref 0–149)
VLDL Cholesterol Cal: 27 mg/dL (ref 5–40)

## 2024-04-26 LAB — HEMOGLOBIN A1C
Est. average glucose Bld gHb Est-mCnc: 120 mg/dL
Hgb A1c MFr Bld: 5.8 % — ABNORMAL HIGH (ref 4.8–5.6)

## 2024-04-27 NOTE — Progress Notes (Signed)
 Patient notified

## 2024-05-20 ENCOUNTER — Telehealth: Payer: Self-pay

## 2024-05-20 NOTE — Progress Notes (Signed)
" ° °  05/20/2024  Patient ID: Jill English, female   DOB: Oct 27, 1965, 58 y.o.   MRN: [id number]  Pharmacy Quality Measure Review  This patient is appearing on a report for being at risk of failing the Controlling Blood Pressure measure this calendar year.   Last documented BP 144/98 on 04/25/24  Attempted to contact patient. Left HIPAA compliant message for patient to return my call at their convenience.  Jon VEAR Lindau, PharmD Clinical Pharmacist [phone]     "

## 2024-06-06 ENCOUNTER — Other Ambulatory Visit: Payer: Self-pay | Admitting: Internal Medicine

## 2024-06-06 DIAGNOSIS — E559 Vitamin D deficiency, unspecified: Secondary | ICD-10-CM

## 2024-07-25 ENCOUNTER — Ambulatory Visit: Admitting: Internal Medicine
